# Patient Record
Sex: Female | Born: 1935 | Race: White | Hispanic: No | Marital: Single | Smoking: Former smoker
Health system: Southern US, Community
[De-identification: ages and names within clinical notes are randomized; demographics above are authoritative.]

## PROBLEM LIST (undated history)

## (undated) DIAGNOSIS — E785 Hyperlipidemia, unspecified: Secondary | ICD-10-CM

## (undated) DIAGNOSIS — F0280 Dementia in other diseases classified elsewhere without behavioral disturbance: Secondary | ICD-10-CM

## (undated) DIAGNOSIS — F0390 Unspecified dementia without behavioral disturbance: Secondary | ICD-10-CM

## (undated) DIAGNOSIS — I10 Essential (primary) hypertension: Secondary | ICD-10-CM

---

## 2019-03-17 ENCOUNTER — Emergency Department (HOSPITAL_COMMUNITY): Payer: Medicare Other

## 2019-03-17 ENCOUNTER — Encounter (HOSPITAL_COMMUNITY): Payer: Self-pay | Admitting: *Deleted

## 2019-03-17 ENCOUNTER — Other Ambulatory Visit: Payer: Self-pay

## 2019-03-17 ENCOUNTER — Emergency Department (HOSPITAL_COMMUNITY)
Admission: EM | Admit: 2019-03-17 | Discharge: 2019-03-17 | Disposition: A | Discharge status: Home / Self Care | Payer: Medicare Other | Attending: Emergency Medicine | Admitting: Emergency Medicine

## 2019-03-17 DIAGNOSIS — S01511A Laceration without foreign body of lip, initial encounter: Secondary | ICD-10-CM

## 2019-03-17 DIAGNOSIS — W19XXXA Unspecified fall, initial encounter: Secondary | ICD-10-CM

## 2019-03-17 DIAGNOSIS — S065X0A Traumatic subdural hemorrhage without loss of consciousness, initial encounter: Secondary | ICD-10-CM | POA: Insufficient documentation

## 2019-03-17 DIAGNOSIS — F0390 Unspecified dementia without behavioral disturbance: Secondary | ICD-10-CM | POA: Insufficient documentation

## 2019-03-17 DIAGNOSIS — S065XAA Traumatic subdural hemorrhage with loss of consciousness status unknown, initial encounter: Secondary | ICD-10-CM

## 2019-03-17 DIAGNOSIS — Y939 Activity, unspecified: Secondary | ICD-10-CM | POA: Insufficient documentation

## 2019-03-17 DIAGNOSIS — Y92199 Unspecified place in other specified residential institution as the place of occurrence of the external cause: Secondary | ICD-10-CM | POA: Insufficient documentation

## 2019-03-17 DIAGNOSIS — S0990XA Unspecified injury of head, initial encounter: Secondary | ICD-10-CM | POA: Diagnosis present

## 2019-03-17 DIAGNOSIS — S0083XA Contusion of other part of head, initial encounter: Secondary | ICD-10-CM

## 2019-03-17 DIAGNOSIS — Y999 Unspecified external cause status: Secondary | ICD-10-CM | POA: Diagnosis not present

## 2019-03-17 DIAGNOSIS — S065X9A Traumatic subdural hemorrhage with loss of consciousness of unspecified duration, initial encounter: Secondary | ICD-10-CM

## 2019-03-17 HISTORY — DX: Unspecified dementia, unspecified severity, without behavioral disturbance, psychotic disturbance, mood disturbance, and anxiety: F03.90

## 2019-03-17 HISTORY — DX: Dementia in other diseases classified elsewhere, unspecified severity, without behavioral disturbance, psychotic disturbance, mood disturbance, and anxiety: F02.80

## 2019-03-17 LAB — CBG MONITORING, ED: Glucose-Capillary: 102 mg/dL — ABNORMAL HIGH (ref 70–99)

## 2019-03-17 MED ORDER — LIDOCAINE HCL (PF) 1 % IJ SOLN
5.0000 mL | Freq: Once | INTRAMUSCULAR | Status: DC
  Filled 2019-03-17: qty 5

## 2019-03-17 NOTE — Discharge Instructions (Addendum)
[name] was evaluated in the emergency department after a fall with facial injuries. There are no fractures seen on CT scan of the face, head or neck. There is a trace SDH seen that was discussed with neurosurgery and it is felt surgery is not indicated. If there is any change in her mental status or neurologic exam changes, please re-evaluate in the emergency department.   Recommend soft foods until dental soreness resolves. There is a sutured laceration in the upper lip. These sutures will not need to be removed and will absorb. Avoid acidic foods until mouth sores heal completely.   Tylenol as needed for discomfort.

## 2019-03-17 NOTE — ED Notes (Signed)
Transported to CT 

## 2019-03-17 NOTE — ED Provider Notes (Signed)
[facility]GENCY DEPARTMENT Provider Note   CSN: [id number] Arrival date & time: 03/17/19  0445     History Chief Complaint  Patient presents with  . Fall    [name] is a 86 y.o. female.  Patient to ED from [facility] after fall to the floor while up to the bathroom. Patient is a resident of a memory unit and has advanced memory loss. No reported vomiting. Patient with facial injuries. No complaint of chest/abdomen/neck/back pain. Not on anticoagulants.    The history is provided by the patient and the EMS personnel. No language interpreter was used.  Fall       No past medical history on file.  There are no problems to display for this patient.     OB History   No obstetric history on file.     No family history on file.  Social History   Tobacco Use  . Smoking status: Not on file  Substance Use Topics  . Alcohol use: Not on file  . Drug use: Not on file    Home Medications Prior to Admission medications   Not on File    Allergies    Patient has no allergy information on record.  Review of Systems   Review of Systems  Unable to perform ROS: Dementia    Physical Exam Updated Vital Signs BP 131/68 (BP Location: Right Arm)   Pulse 94   Temp 97.8 F (36.6 C) (Oral)   Resp 18   SpO2 93%   Physical Exam Vitals and nursing note reviewed.  Constitutional:      General: She is not in acute distress. HENT:     Head:     Comments: There is an abrasion above left eyebrow with small hematoma. Nose is swollen with mild deformity. Dried blood in bilateral nostrils. There is significant bruising to upper and lower lips, and a 0.5 cm laceration to left upper lip crossing the vermilion border, and blood over front dentition, no active bleeding. No gross malocclusion. Swallowing without difficulty.  Cardiovascular:     Rate and Rhythm: Normal rate.  Pulmonary:     Effort: Pulmonary effort is normal.  Abdominal:     General:  There is no distension.     Tenderness: There is no abdominal tenderness.  Musculoskeletal:     Comments: FROM extremities with limited extension of left UE. No tenderness to palpation, no visualized bruisines.   Skin:    General: Skin is warm and dry.  Neurological:     Mental Status: She is alert.     ED Results / Procedures / Treatments   Labs (all labs ordered are listed, but only abnormal results are displayed) Labs Reviewed  CBG MONITORING, ED - Abnormal; Notable for the following components:      Result Value   Glucose-Capillary 102 (*)    All other components within normal limits    EKG None  Radiology No results found.  Procedures .Marland KitchenLaceration Repair  Date/Time: 03/18/2019 6:56 AM Performed by: Charlann Lange, PA-C Authorized by: Charlann Lange, PA-C   Consent:    Consent obtained:  Verbal   Consent given by:  Patient Laceration details:    Location:  Lip   Lip location:  Upper exterior lip   Length (cm):  0.5 Repair type:    Repair type:  Simple Exploration:    Contaminated: no   Treatment:    Area cleansed with:  Saline Skin repair:    Repair  method:  Sutures   Suture size:  5-0   Suture material:  Fast-absorbing gut   Suture technique:  Simple interrupted   Number of sutures:  3 Approximation:    Approximation:  Close Post-procedure details:    Dressing:  Open (no dressing)   Patient tolerance of procedure:  Tolerated well, no immediate complications Comments:     Vermilion border aligned well.    (including critical care time)  Medications Ordered in ED Medications - No data to display  ED Course  I have reviewed the triage vital signs and the nursing notes.  Pertinent labs & imaging results that were available during my care of the patient were reviewed by me and considered in my medical decision making (see chart for details).    MDM Rules/Calculators/A&P                      Patient to ED after fall at [facility], sustaining  facial injuries. Unclear if there was LOC.   The patient is in NAD. VSS. CT's ordered to evaluate injuries.   CT's negative for facial fractures, or c-spine injury. There is a trace subdural hematoma. This was discussed with neurosurgery who feels no further intervention indicated from surgical standpoint.  She is not on blood thinners. She has no mental status or neurologic changes while under observation in the ED. She has been seen by Dr. Eudelia Bunch. She is felt appropriate for discharge back to [facility]. Recommend observation for changes in her mentation. This was clearly stated on her discharge paperwork.   Laceration to lip repaired as per above note.   Family member, Inocencio Homes, contacted and updated. All questions answered. She is able to pick up the patient and transport back to [facility].   Final Clinical Impression(s) / ED Diagnoses Final diagnoses:  None   1. Fall 2. Facial contusions 3. Lip laceration 4. Small SDH  Rx / DC Orders ED Discharge Orders    None       Danne Harbor 03/18/19 [id number]    Nira Conn, MD 03/25/19 [phone]

## 2019-03-17 NOTE — ED Triage Notes (Signed)
Patient presents to ed via [facility] from [facility] , states she was getting up to go to the bathroom and fell face forward. Abrasion above left eye, small hematoma below left eye. Abrasion to nose lip left side upper lip.

## 2019-03-21 ENCOUNTER — Emergency Department (HOSPITAL_COMMUNITY): Payer: Medicare Other

## 2019-03-21 ENCOUNTER — Encounter (HOSPITAL_COMMUNITY): Payer: Self-pay | Admitting: Internal Medicine

## 2019-03-21 ENCOUNTER — Observation Stay (HOSPITAL_COMMUNITY)
Admission: EM | Admit: 2019-03-21 | Discharge: 2019-03-22 | Disposition: A | Discharge status: Home / Self Care | Payer: Medicare Other | Attending: Internal Medicine | Admitting: Internal Medicine

## 2019-03-21 ENCOUNTER — Other Ambulatory Visit: Payer: Self-pay

## 2019-03-21 DIAGNOSIS — K219 Gastro-esophageal reflux disease without esophagitis: Secondary | ICD-10-CM | POA: Insufficient documentation

## 2019-03-21 DIAGNOSIS — S065X9A Traumatic subdural hemorrhage with loss of consciousness of unspecified duration, initial encounter: Secondary | ICD-10-CM | POA: Diagnosis not present

## 2019-03-21 DIAGNOSIS — Z79899 Other long term (current) drug therapy: Secondary | ICD-10-CM | POA: Insufficient documentation

## 2019-03-21 DIAGNOSIS — F0280 Dementia in other diseases classified elsewhere without behavioral disturbance: Secondary | ICD-10-CM | POA: Diagnosis not present

## 2019-03-21 DIAGNOSIS — I10 Essential (primary) hypertension: Secondary | ICD-10-CM | POA: Insufficient documentation

## 2019-03-21 DIAGNOSIS — Z20822 Contact with and (suspected) exposure to covid-19: Secondary | ICD-10-CM | POA: Diagnosis not present

## 2019-03-21 DIAGNOSIS — S065XAA Traumatic subdural hemorrhage with loss of consciousness status unknown, initial encounter: Secondary | ICD-10-CM | POA: Diagnosis present

## 2019-03-21 DIAGNOSIS — G309 Alzheimer's disease, unspecified: Secondary | ICD-10-CM | POA: Insufficient documentation

## 2019-03-21 DIAGNOSIS — W06XXXA Fall from bed, initial encounter: Secondary | ICD-10-CM | POA: Diagnosis not present

## 2019-03-21 DIAGNOSIS — Z66 Do not resuscitate: Secondary | ICD-10-CM | POA: Insufficient documentation

## 2019-03-21 DIAGNOSIS — Z880 Allergy status to penicillin: Secondary | ICD-10-CM | POA: Diagnosis not present

## 2019-03-21 DIAGNOSIS — E785 Hyperlipidemia, unspecified: Secondary | ICD-10-CM | POA: Diagnosis not present

## 2019-03-21 DIAGNOSIS — R296 Repeated falls: Secondary | ICD-10-CM | POA: Insufficient documentation

## 2019-03-21 DIAGNOSIS — Z87891 Personal history of nicotine dependence: Secondary | ICD-10-CM | POA: Diagnosis not present

## 2019-03-21 DIAGNOSIS — E876 Hypokalemia: Secondary | ICD-10-CM | POA: Diagnosis not present

## 2019-03-21 DIAGNOSIS — F0390 Unspecified dementia without behavioral disturbance: Secondary | ICD-10-CM | POA: Diagnosis present

## 2019-03-21 HISTORY — DX: Essential (primary) hypertension: I10

## 2019-03-21 HISTORY — DX: Hyperlipidemia, unspecified: E78.5

## 2019-03-21 LAB — BASIC METABOLIC PANEL
Anion gap: 11 (ref 5–15)
BUN: 17 mg/dL (ref 8–23)
CO2: 25 mmol/L (ref 22–32)
Calcium: 8.6 mg/dL — ABNORMAL LOW (ref 8.9–10.3)
Chloride: 102 mmol/L (ref 98–111)
Creatinine, Ser: 0.91 mg/dL (ref 0.44–1.00)
GFR calc Af Amer: >60 mL/min (ref >60)
GFR calc non Af Amer: 54 mL/min — ABNORMAL LOW (ref >60)
Glucose, Bld: 113 mg/dL — ABNORMAL HIGH (ref 70–99)
Potassium: 3.1 mmol/L — ABNORMAL LOW (ref 3.5–5.1)
Sodium: 138 mmol/L (ref 135–145)

## 2019-03-21 LAB — CBC WITH DIFFERENTIAL/PLATELET
Abs Immature Granulocytes: 0.04 10*3/uL (ref 0.00–0.07)
Basophils Absolute: 0 10*3/uL (ref 0.0–0.1)
Basophils Relative: 0 %
Eosinophils Absolute: 0 10*3/uL (ref 0.0–0.5)
Eosinophils Relative: 0 %
HCT: 39.5 % (ref 36.0–46.0)
Hemoglobin: 12.9 g/dL (ref 12.0–15.0)
Immature Granulocytes: 0 %
Lymphocytes Relative: 18 %
Lymphs Abs: 1.8 10*3/uL (ref 0.7–4.0)
MCH: 29 pg (ref 26.0–34.0)
MCHC: 32.7 g/dL (ref 30.0–36.0)
MCV: 88.8 fL (ref 80.0–100.0)
Monocytes Absolute: 1 10*3/uL (ref 0.1–1.0)
Monocytes Relative: 10 %
Neutro Abs: 7.1 10*3/uL (ref 1.7–7.7)
Neutrophils Relative %: 72 %
Platelets: 300 10*3/uL (ref 150–400)
RBC: 4.45 MIL/uL (ref 3.87–5.11)
RDW: 13.7 % (ref 11.5–15.5)
WBC: 10 10*3/uL (ref 4.0–10.5)
nRBC: 0 % (ref 0.0–0.2)

## 2019-03-21 LAB — MRSA PCR SCREENING: MRSA by PCR: NEGATIVE

## 2019-03-21 LAB — SARS CORONAVIRUS 2 (TAT 6-24 HRS): SARS Coronavirus 2: NEGATIVE

## 2019-03-21 LAB — TSH: TSH: 3.657 u[IU]/mL (ref 0.350–4.500)

## 2019-03-21 MED ORDER — PRAVASTATIN SODIUM 20 MG PO TABS
10.0000 mg | ORAL_TABLET | Freq: Every day | ORAL | Status: DC
  Administered 2019-03-21: 10 mg via ORAL
  Filled 2019-03-21: qty 1

## 2019-03-21 MED ORDER — HYDROCHLOROTHIAZIDE 25 MG PO TABS
25.0000 mg | ORAL_TABLET | Freq: Every day | ORAL | Status: DC
  Administered 2019-03-21 – 2019-03-22 (×2): 25 mg via ORAL
  Filled 2019-03-21 (×2): qty 1

## 2019-03-21 MED ORDER — TRAZODONE HCL 50 MG PO TABS
50.0000 mg | ORAL_TABLET | Freq: Every day | ORAL | Status: DC
  Administered 2019-03-21: 50 mg via ORAL
  Filled 2019-03-21: qty 1

## 2019-03-21 MED ORDER — LOSARTAN POTASSIUM 50 MG PO TABS
100.0000 mg | ORAL_TABLET | Freq: Every day | ORAL | Status: DC
  Administered 2019-03-21 – 2019-03-22 (×2): 100 mg via ORAL
  Filled 2019-03-21 (×2): qty 2

## 2019-03-21 MED ORDER — POTASSIUM CHLORIDE CRYS ER 20 MEQ PO TBCR: 40.0000 meq | EXTENDED_RELEASE_TABLET | Freq: Once | ORAL | Status: DC

## 2019-03-21 MED ORDER — LOSARTAN POTASSIUM-HCTZ 100-25 MG PO TABS: 1.0000 | ORAL_TABLET | Freq: Every day | ORAL | Status: DC

## 2019-03-21 MED ORDER — ACETAMINOPHEN 325 MG PO TABS
650.0000 mg | ORAL_TABLET | Freq: Four times a day (QID) | ORAL | Status: DC | PRN
  Administered 2019-03-21: 650 mg via ORAL
  Filled 2019-03-21: qty 2

## 2019-03-21 MED ORDER — SODIUM CHLORIDE 0.9 % IV SOLN: INTRAVENOUS | Status: DC

## 2019-03-21 MED ORDER — ONDANSETRON HCL 4 MG PO TABS: 4.0000 mg | ORAL_TABLET | Freq: Four times a day (QID) | ORAL | Status: DC | PRN

## 2019-03-21 MED ORDER — ONDANSETRON HCL 4 MG/2ML IJ SOLN: 4.0000 mg | Freq: Four times a day (QID) | INTRAMUSCULAR | Status: DC | PRN

## 2019-03-21 MED ORDER — ACETAMINOPHEN 650 MG RE SUPP: 650.0000 mg | Freq: Four times a day (QID) | RECTAL | Status: DC | PRN

## 2019-03-21 MED ORDER — MELATONIN 5 MG PO TABS: 5.0000 mg | ORAL_TABLET | Freq: Every evening | ORAL | Status: DC | PRN

## 2019-03-21 MED ORDER — GUAIFENESIN-CODEINE 100-10 MG/5ML PO SOLN
5.0000 mL | Freq: Three times a day (TID) | ORAL | Status: DC | PRN
  Administered 2019-03-22: 5 mL via ORAL
  Filled 2019-03-21: qty 5

## 2019-03-21 MED ORDER — POTASSIUM CHLORIDE CRYS ER 20 MEQ PO TBCR
40.0000 meq | EXTENDED_RELEASE_TABLET | Freq: Once | ORAL | Status: AC
  Administered 2019-03-21: 40 meq via ORAL
  Filled 2019-03-21: qty 2

## 2019-03-21 MED ORDER — PANTOPRAZOLE SODIUM 40 MG PO TBEC
40.0000 mg | DELAYED_RELEASE_TABLET | Freq: Every day | ORAL | Status: DC
  Administered 2019-03-21 – 2019-03-22 (×2): 40 mg via ORAL
  Filled 2019-03-21 (×2): qty 1

## 2019-03-21 MED ORDER — SENNOSIDES-DOCUSATE SODIUM 8.6-50 MG PO TABS: 1.0000 | ORAL_TABLET | Freq: Every evening | ORAL | Status: DC | PRN

## 2019-03-21 MED ORDER — ORAL CARE MOUTH RINSE
15.0000 mL | Freq: Two times a day (BID) | OROMUCOSAL | Status: DC
  Administered 2019-03-21 – 2019-03-22 (×3): 15 mL via OROMUCOSAL

## 2019-03-21 NOTE — ED Notes (Signed)
Patient denies pain at this time.

## 2019-03-21 NOTE — ED Notes (Signed)
ED TO INPATIENT HANDOFF REPORT  Name/Age/Gender [name] 86 y.o. female  Code Status Advance Directive Documentation     Most Recent Value  Type of Advance Directive  Healthcare Power of Attorney, Living will  Pre-existing out of facility DNR order (yellow form or pink MOST form)  --  "MOST" Form in Place?  --      Home/SNF/Other Skilled nursing facility  Chief Complaint Subdural hematoma ([place]) [N56.2Z3Y]  Level of Care/Admitting Diagnosis ED Disposition    ED Disposition Condition [place]: [facility] [[id number]]  Level of Care: Telemetry [5]  Admit to tele based on following criteria: Monitor for Ischemic changes  Covid Evaluation: Asymptomatic Screening Protocol (No Symptoms)  Diagnosis: Subdural hematoma [facility]) [[id number]]  Admitting Physician: [place] ERIC J [[id number]]  Attending Physician: [place] ERIC J [[id number]]       Medical History Past Medical History:  Diagnosis Date  . Alzheimer disease ([place])   . Dementia ([place])   . Essential hypertension   . Hyperlipidemia     Allergies Allergies  Allergen Reactions  . Penicillins Other (See Comments)    unknown    IV Location/Drains/Wounds Patient Lines/Drains/Airways Status   Active Line/Drains/Airways    Name:   Placement date:   Placement time:   Site:   Days:   Peripheral IV 03/21/19 Right;Posterior Wrist   03/21/19    0941    Wrist   less than 1          Labs/Imaging Results for orders placed or performed during the hospital encounter of 03/21/19 (from the past 48 hour(s))  CBC with Differential/Platelet     Status: None   Collection Time: 03/21/19  9:27 AM  Result Value Ref Range   WBC 10.0 4.0 - 10.5 K/uL   RBC 4.45 3.87 - 5.11 MIL/uL   Hemoglobin 12.9 12.0 - 15.0 g/dL   HCT 39.5 36.0 - 46.0 %   MCV 88.8 80.0 - 100.0 fL   MCH 29.0 26.0 - 34.0 pg   MCHC 32.7 30.0 - 36.0 g/dL   RDW 13.7 11.5 - 15.5 %   Platelets 300 150 - 400 K/uL   nRBC 0.0 0.0 - 0.2  %   Neutrophils Relative % 72 %   Neutro Abs 7.1 1.7 - 7.7 K/uL   Lymphocytes Relative 18 %   Lymphs Abs 1.8 0.7 - 4.0 K/uL   Monocytes Relative 10 %   Monocytes Absolute 1.0 0.1 - 1.0 K/uL   Eosinophils Relative 0 %   Eosinophils Absolute 0.0 0.0 - 0.5 K/uL   Basophils Relative 0 %   Basophils Absolute 0.0 0.0 - 0.1 K/uL   Immature Granulocytes 0 %   Abs Immature Granulocytes 0.04 0.00 - 0.07 K/uL    Comment: Performed at [facility], [place] [address] [place], [place] [address]  Basic metabolic panel     Status: Abnormal   Collection Time: 03/21/19  9:27 AM  Result Value Ref Range   Sodium 138 135 - 145 mmol/L   Potassium 3.1 (L) 3.5 - 5.1 mmol/L   Chloride 102 98 - 111 mmol/L   CO2 25 22 - 32 mmol/L   Glucose, Bld 113 (H) 70 - 99 mg/dL   BUN 17 8 - 23 mg/dL   Creatinine, Ser 0.91 0.44 - 1.00 mg/dL   Calcium 8.6 (L) 8.9 - 10.3 mg/dL   GFR calc non Af Amer 54 (L) >60 mL/min   GFR calc Af Amer >  60 >60 mL/min   Anion gap 11 5 - 15    Comment: Performed at [facility], [address], [place], [place] [address]   CT Head Wo Contrast  Result Date: 03/21/2019 CLINICAL DATA:  Unwitnessed fall.  Facial trauma. EXAM: CT HEAD WITHOUT CONTRAST CT MAXILLOFACIAL WITHOUT CONTRAST CT CERVICAL SPINE WITHOUT CONTRAST TECHNIQUE: Multidetector CT imaging of the head, cervical spine, and maxillofacial structures were performed using the standard protocol without intravenous contrast. Multiplanar CT image reconstructions of the cervical spine and maxillofacial structures were also generated. COMPARISON:  CT 03/12/2019 FINDINGS: CT HEAD FINDINGS Brain: Acute subdural hematoma on the left has developed since the prior study. This measures approximately 3 mm anteriorly. Focal high density hematoma left posterior temporal lobe measures 7 mm in thickness. Resolving small interhemispheric subdural hematoma anteriorly compared with the prior study. Generalized atrophy with  extensive chronic microvascular ischemic change in the white matter. No acute ischemic infarct. 8 mm partially calcified extra-axial mass above the right orbit unchanged compatible with meningioma. Vascular: Negative for hyperdense vessel. Skull: Negative for skull fracture. Other: None CT MAXILLOFACIAL FINDINGS Osseous: Negative for facial fracture. Orbits: Bilateral cataract extraction.  No orbital mass or hematoma Sinuses: Mild mucosal edema right maxillary sinus. No air-fluid level. Soft tissues: Mild soft tissue swelling right supraorbital region. Improving soft tissue swelling left supraorbital region. CT CERVICAL SPINE FINDINGS Alignment: Normal Skull base and vertebrae: Negative for fracture Soft tissues and spinal canal: Negative Disc levels: Disc degeneration and mild spurring throughout the cervical spine. Mild diffuse facet degeneration. Upper chest: Lung apices clear bilaterally. Atherosclerotic aortic arch. Other: None IMPRESSION: 1. Acute left-sided subdural hematoma measuring up to 7 mm in the left posterior temporal region. Smaller subdural hematoma extends into the left frontal lobe. No midline shift. CT head 3 days ago demonstrated small interhemispheric subdural hematoma which shows mild improvement. 2. Atrophy and extensive chronic microvascular ischemic change. No acute infarct. 3. Negative for facial fracture 4. Negative for cervical spine fracture. 5. These results were called by telephone at the time of interpretation on 03/21/2019 at 8:58 am to provider Lorre Nick , who verbally acknowledged these results. Electronically Signed   By: Marlan Palau M.D.   On: 03/21/2019 08:58   CT Cervical Spine Wo Contrast  Result Date: 03/21/2019 CLINICAL DATA:  Unwitnessed fall.  Facial trauma. EXAM: CT HEAD WITHOUT CONTRAST CT MAXILLOFACIAL WITHOUT CONTRAST CT CERVICAL SPINE WITHOUT CONTRAST TECHNIQUE: Multidetector CT imaging of the head, cervical spine, and maxillofacial structures were  performed using the standard protocol without intravenous contrast. Multiplanar CT image reconstructions of the cervical spine and maxillofacial structures were also generated. COMPARISON:  CT 03/12/2019 FINDINGS: CT HEAD FINDINGS Brain: Acute subdural hematoma on the left has developed since the prior study. This measures approximately 3 mm anteriorly. Focal high density hematoma left posterior temporal lobe measures 7 mm in thickness. Resolving small interhemispheric subdural hematoma anteriorly compared with the prior study. Generalized atrophy with extensive chronic microvascular ischemic change in the white matter. No acute ischemic infarct. 8 mm partially calcified extra-axial mass above the right orbit unchanged compatible with meningioma. Vascular: Negative for hyperdense vessel. Skull: Negative for skull fracture. Other: None CT MAXILLOFACIAL FINDINGS Osseous: Negative for facial fracture. Orbits: Bilateral cataract extraction.  No orbital mass or hematoma Sinuses: Mild mucosal edema right maxillary sinus. No air-fluid level. Soft tissues: Mild soft tissue swelling right supraorbital region. Improving soft tissue swelling left supraorbital region. CT CERVICAL SPINE FINDINGS Alignment: Normal Skull base and vertebrae:  Negative for fracture Soft tissues and spinal canal: Negative Disc levels: Disc degeneration and mild spurring throughout the cervical spine. Mild diffuse facet degeneration. Upper chest: Lung apices clear bilaterally. Atherosclerotic aortic arch. Other: None IMPRESSION: 1. Acute left-sided subdural hematoma measuring up to 7 mm in the left posterior temporal region. Smaller subdural hematoma extends into the left frontal lobe. No midline shift. CT head 3 days ago demonstrated small interhemispheric subdural hematoma which shows mild improvement. 2. Atrophy and extensive chronic microvascular ischemic change. No acute infarct. 3. Negative for facial fracture 4. Negative for cervical spine  fracture. 5. These results were called by telephone at the time of interpretation on 03/21/2019 at 8:58 am to provider Lorre Nick , who verbally acknowledged these results. Electronically Signed   By: Marlan Palau M.D.   On: 03/21/2019 08:58   CT Maxillofacial WO CM  Result Date: 03/21/2019 CLINICAL DATA:  Unwitnessed fall.  Facial trauma. EXAM: CT HEAD WITHOUT CONTRAST CT MAXILLOFACIAL WITHOUT CONTRAST CT CERVICAL SPINE WITHOUT CONTRAST TECHNIQUE: Multidetector CT imaging of the head, cervical spine, and maxillofacial structures were performed using the standard protocol without intravenous contrast. Multiplanar CT image reconstructions of the cervical spine and maxillofacial structures were also generated. COMPARISON:  CT 03/12/2019 FINDINGS: CT HEAD FINDINGS Brain: Acute subdural hematoma on the left has developed since the prior study. This measures approximately 3 mm anteriorly. Focal high density hematoma left posterior temporal lobe measures 7 mm in thickness. Resolving small interhemispheric subdural hematoma anteriorly compared with the prior study. Generalized atrophy with extensive chronic microvascular ischemic change in the white matter. No acute ischemic infarct. 8 mm partially calcified extra-axial mass above the right orbit unchanged compatible with meningioma. Vascular: Negative for hyperdense vessel. Skull: Negative for skull fracture. Other: None CT MAXILLOFACIAL FINDINGS Osseous: Negative for facial fracture. Orbits: Bilateral cataract extraction.  No orbital mass or hematoma Sinuses: Mild mucosal edema right maxillary sinus. No air-fluid level. Soft tissues: Mild soft tissue swelling right supraorbital region. Improving soft tissue swelling left supraorbital region. CT CERVICAL SPINE FINDINGS Alignment: Normal Skull base and vertebrae: Negative for fracture Soft tissues and spinal canal: Negative Disc levels: Disc degeneration and mild spurring throughout the cervical spine. Mild diffuse  facet degeneration. Upper chest: Lung apices clear bilaterally. Atherosclerotic aortic arch. Other: None IMPRESSION: 1. Acute left-sided subdural hematoma measuring up to 7 mm in the left posterior temporal region. Smaller subdural hematoma extends into the left frontal lobe. No midline shift. CT head 3 days ago demonstrated small interhemispheric subdural hematoma which shows mild improvement. 2. Atrophy and extensive chronic microvascular ischemic change. No acute infarct. 3. Negative for facial fracture 4. Negative for cervical spine fracture. 5. These results were called by telephone at the time of interpretation on 03/21/2019 at 8:58 am to provider Lorre Nick , who verbally acknowledged these results. Electronically Signed   By: Marlan Palau M.D.   On: 03/21/2019 08:58    Pending Labs Unresulted Labs (From admission, onward)    Start     Ordered   03/21/19 0901  SARS CORONAVIRUS 2 (TAT 6-24 HRS) Nasopharyngeal Nasopharyngeal Swab  (Tier 3 (TAT 6-24 hrs))  Once,   STAT    Question Answer Comment  Is this test for diagnosis or screening Screening   Symptomatic for COVID-19 as defined by CDC No   Hospitalized for COVID-19 No   Admitted to ICU for COVID-19 No   Previously tested for COVID-19 No   Resident in a congregate (group) care setting No  Employed in healthcare setting No   Pregnant No      03/21/19 0900   Signed and Held  TSH  Once,   R     Signed and Held   Signed and Armed forces training and education officer morning,   R     Signed and Held   Signed and Held  CBC  Tomorrow morning,   R     Signed and Held   Signed and Held  Magnesium  Daily,   R     Signed and Held          Vitals/Pain Today's Vitals   03/21/19 0900 03/21/19 1000 03/21/19 1100 03/21/19 1130  BP: 139/66 132/63 130/80 123/63  Pulse: 78 100 89 70  Resp:    17  Temp:      TempSrc:      SpO2: 93% 94% 95% 92%  Weight:      Height:      PainSc:        Isolation Precautions No active  isolations  Medications Medications  0.9 %  sodium chloride infusion ( Intravenous New Bag/Given 03/21/19 0943)  potassium chloride SA (KLOR-CON) CR tablet 40 mEq (40 mEq Oral Given 03/21/19 1032)    Mobility walks with device

## 2019-03-21 NOTE — H&P (Signed)
History and Physical    [name] GEX:[id number] DOB: August 10, 1922 DOA: 03/21/2019  PCP: Soledad Gerlach, MD  Patient coming from: [facility]  I have personally briefly reviewed patient's old medical records in [facility]  Chief Complaint: Fall  HPI: [name] is a 86 y.o. female with medical history significant of dementia, essential hypertension, hyperlipidemia, GERD who presents from [facility], SNF/memory care for recurrent fall.  Patient is pleasantly confused, and mentation appears to be at baseline per niece's report.  History obtained from patient's niece who is the healthcare power of attorney who is present at bedside given her dementia.  Apparently, patient had a fall this morning when she was getting out of bed striking the windowsill.  This is a recurrent event, in which on Monday she also fell onto the tile floor in her bathroom.  This is a new issue per the niece, she currently does not have a bed alarm at the facility.  ED Course: Temperature 97.7, HR 93, RR 16, BP 140/70, SPO2 92% on room air.  The BC count 10.0, hemoglobin 12.9, platelet 300.  Sodium 138, potassium 3.1, chloride 102, CO2 25, BUN 17, creatinine 0.91, glucose 113.  CT head with acute left subdural hematoma 7 mm in the left posterior temporal region, smaller subdural hematoma extending into the left frontal lobe.  No midline shift.  Prior small interhemispheric subdural from CT 3 days ago shows mild improvement.  No facial fracture, no cervical spine fracture.  No acute infarct.  ED physician, Dr. Freida Busman discussed case with neurosurgery Dr. Wynetta Emery, who recommended medicine admission for observation with repeat CT head in a.m.  Review of Systems: As per HPI otherwise 10 point review of systems negative.    Past Medical History:  Diagnosis Date  . Alzheimer disease (HCC)   . Dementia (HCC)   . Essential hypertension   . Hyperlipidemia      has no history on file for tobacco, alcohol, and drug.  Allergies    Allergen Reactions  . Penicillins Other (See Comments)    unknown    No family history on file.  Family history reviewed and not pertinent   Prior to Admission medications   Medication Sig Start Date End Date Taking? Authorizing Provider  acetaminophen (TYLENOL) 325 MG tablet Take 650 mg by mouth every 6 (six) hours as needed for mild pain or headache.   Yes [provider]  guaiFENesin-codeine (ROBITUSSIN AC) 100-10 MG/5ML syrup Take 5 mLs by mouth every 8 (eight) hours as needed for cough.   Yes [provider]  losartan-hydrochlorothiazide (HYZAAR) 100-25 MG tablet Take 1 tablet by mouth daily. 03/05/19  Yes [provider]  Melatonin 5 MG TABS Take 5 mg by mouth at bedtime as needed (sleep).   Yes [provider]  omeprazole (PRILOSEC) 20 MG capsule Take 20 mg by mouth every other day. 03/05/19  Yes [provider]  pravastatin (PRAVACHOL) 10 MG tablet Take 10 mg by mouth daily.  03/05/19  Yes [provider]  traZODone (DESYREL) 50 MG tablet Take 50 mg by mouth at bedtime. 03/05/19  Yes [provider]    Physical Exam: Vitals:   03/21/19 0711 03/21/19 0830 03/21/19 0851  BP: 130/68 140/70   Pulse: 73 93   Resp: 16    Temp: 97.7 F (36.5 C)    TempSrc: Oral    SpO2: 92% 92%   Weight:   68 kg  Height:   5\' 4"  (1.626 m)  Constitutional: NAD, calm, comfortable, pleasantly confused Eyes: PERRL, ecchymosis noted to left orbit ENMT: Mucous membranes are moist. Posterior pharynx clear of any exudate or lesions.Normal dentition.  Ecchymosis noted to left lower lip Neck: normal, supple, no masses, no thyromegaly Respiratory: clear to auscultation bilaterally, no wheezing, no crackles. Normal respiratory effort. No accessory muscle use.  Cardiovascular: Regular rate and rhythm, no murmurs / rubs / gallops. No extremity edema. 2+ pedal pulses. No carotid bruits.  Abdomen: no tenderness, no masses palpated. No  hepatosplenomegaly. Bowel sounds positive.  Musculoskeletal: no clubbing / cyanosis. No joint deformity upper and lower extremities. Good ROM, no contractures. Normal muscle tone.  Skin: no rashes, lesions, ulcers. No induration Neurologic: CN 2-12 grossly intact. Sensation intact, DTR normal. Strength 5/5 in all 4.  Psychiatric: Alert, normal mood, pleasantly confused   Labs on Admission: I have personally reviewed following labs and imaging studies  CBC: Recent Labs  Lab 03/21/19 0927  WBC 10.0  NEUTROABS 7.1  HGB 12.9  HCT 39.5  MCV 88.8  PLT 300   Basic Metabolic Panel: Recent Labs  Lab 03/21/19 0927  NA 138  K 3.1*  CL 102  CO2 25  GLUCOSE 113*  BUN 17  CREATININE 0.91  CALCIUM 8.6*   GFR: Estimated Creatinine Clearance: 35.8 mL/min (by C-G formula based on SCr of 0.91 mg/dL). Liver Function Tests: No results for input(s): AST, ALT, ALKPHOS, BILITOT, PROT, ALBUMIN in the last 168 hours. No results for input(s): LIPASE, AMYLASE in the last 168 hours. No results for input(s): AMMONIA in the last 168 hours. Coagulation Profile: No results for input(s): INR, PROTIME in the last 168 hours. Cardiac Enzymes: No results for input(s): CKTOTAL, CKMB, CKMBINDEX, TROPONINI in the last 168 hours. BNP (last 3 results) No results for input(s): PROBNP in the last 8760 hours. HbA1C: No results for input(s): HGBA1C in the last 72 hours. CBG: Recent Labs  Lab 03/17/19 0451  GLUCAP 102*   Lipid Profile: No results for input(s): CHOL, HDL, LDLCALC, TRIG, CHOLHDL, LDLDIRECT in the last 72 hours. Thyroid Function Tests: No results for input(s): TSH, T4TOTAL, FREET4, T3FREE, THYROIDAB in the last 72 hours. Anemia Panel: No results for input(s): VITAMINB12, FOLATE, FERRITIN, TIBC, IRON, RETICCTPCT in the last 72 hours. Urine analysis: No results found for: COLORURINE, APPEARANCEUR, LABSPEC, PHURINE, GLUCOSEU, HGBUR, BILIRUBINUR, KETONESUR, PROTEINUR, UROBILINOGEN, NITRITE,  LEUKOCYTESUR  Radiological Exams on Admission: CT Head Wo Contrast  Result Date: 03/21/2019 CLINICAL DATA:  Unwitnessed fall.  Facial trauma. EXAM: CT HEAD WITHOUT CONTRAST CT MAXILLOFACIAL WITHOUT CONTRAST CT CERVICAL SPINE WITHOUT CONTRAST TECHNIQUE: Multidetector CT imaging of the head, cervical spine, and maxillofacial structures were performed using the standard protocol without intravenous contrast. Multiplanar CT image reconstructions of the cervical spine and maxillofacial structures were also generated. COMPARISON:  CT 03/12/2019 FINDINGS: CT HEAD FINDINGS Brain: Acute subdural hematoma on the left has developed since the prior study. This measures approximately 3 mm anteriorly. Focal high density hematoma left posterior temporal lobe measures 7 mm in thickness. Resolving small interhemispheric subdural hematoma anteriorly compared with the prior study. Generalized atrophy with extensive chronic microvascular ischemic change in the white matter. No acute ischemic infarct. 8 mm partially calcified extra-axial mass above the right orbit unchanged compatible with meningioma. Vascular: Negative for hyperdense vessel. Skull: Negative for skull fracture. Other: None CT MAXILLOFACIAL FINDINGS Osseous: Negative for facial fracture. Orbits: Bilateral cataract extraction.  No orbital mass or hematoma Sinuses: Mild mucosal edema right maxillary sinus. No air-fluid level. Soft tissues: Mild  soft tissue swelling right supraorbital region. Improving soft tissue swelling left supraorbital region. CT CERVICAL SPINE FINDINGS Alignment: Normal Skull base and vertebrae: Negative for fracture Soft tissues and spinal canal: Negative Disc levels: Disc degeneration and mild spurring throughout the cervical spine. Mild diffuse facet degeneration. Upper chest: Lung apices clear bilaterally. Atherosclerotic aortic arch. Other: None IMPRESSION: 1. Acute left-sided subdural hematoma measuring up to 7 mm in the left posterior  temporal region. Smaller subdural hematoma extends into the left frontal lobe. No midline shift. CT head 3 days ago demonstrated small interhemispheric subdural hematoma which shows mild improvement. 2. Atrophy and extensive chronic microvascular ischemic change. No acute infarct. 3. Negative for facial fracture 4. Negative for cervical spine fracture. 5. These results were called by telephone at the time of interpretation on 03/21/2019 at 8:58 am to provider Lorre Nick , who verbally acknowledged these results. Electronically Signed   By: Marlan Palau M.D.   On: 03/21/2019 08:58   CT Cervical Spine Wo Contrast  Result Date: 03/21/2019 CLINICAL DATA:  Unwitnessed fall.  Facial trauma. EXAM: CT HEAD WITHOUT CONTRAST CT MAXILLOFACIAL WITHOUT CONTRAST CT CERVICAL SPINE WITHOUT CONTRAST TECHNIQUE: Multidetector CT imaging of the head, cervical spine, and maxillofacial structures were performed using the standard protocol without intravenous contrast. Multiplanar CT image reconstructions of the cervical spine and maxillofacial structures were also generated. COMPARISON:  CT 03/12/2019 FINDINGS: CT HEAD FINDINGS Brain: Acute subdural hematoma on the left has developed since the prior study. This measures approximately 3 mm anteriorly. Focal high density hematoma left posterior temporal lobe measures 7 mm in thickness. Resolving small interhemispheric subdural hematoma anteriorly compared with the prior study. Generalized atrophy with extensive chronic microvascular ischemic change in the white matter. No acute ischemic infarct. 8 mm partially calcified extra-axial mass above the right orbit unchanged compatible with meningioma. Vascular: Negative for hyperdense vessel. Skull: Negative for skull fracture. Other: None CT MAXILLOFACIAL FINDINGS Osseous: Negative for facial fracture. Orbits: Bilateral cataract extraction.  No orbital mass or hematoma Sinuses: Mild mucosal edema right maxillary sinus. No air-fluid  level. Soft tissues: Mild soft tissue swelling right supraorbital region. Improving soft tissue swelling left supraorbital region. CT CERVICAL SPINE FINDINGS Alignment: Normal Skull base and vertebrae: Negative for fracture Soft tissues and spinal canal: Negative Disc levels: Disc degeneration and mild spurring throughout the cervical spine. Mild diffuse facet degeneration. Upper chest: Lung apices clear bilaterally. Atherosclerotic aortic arch. Other: None IMPRESSION: 1. Acute left-sided subdural hematoma measuring up to 7 mm in the left posterior temporal region. Smaller subdural hematoma extends into the left frontal lobe. No midline shift. CT head 3 days ago demonstrated small interhemispheric subdural hematoma which shows mild improvement. 2. Atrophy and extensive chronic microvascular ischemic change. No acute infarct. 3. Negative for facial fracture 4. Negative for cervical spine fracture. 5. These results were called by telephone at the time of interpretation on 03/21/2019 at 8:58 am to provider Lorre Nick , who verbally acknowledged these results. Electronically Signed   By: Marlan Palau M.D.   On: 03/21/2019 08:58   CT Maxillofacial WO CM  Result Date: 03/21/2019 CLINICAL DATA:  Unwitnessed fall.  Facial trauma. EXAM: CT HEAD WITHOUT CONTRAST CT MAXILLOFACIAL WITHOUT CONTRAST CT CERVICAL SPINE WITHOUT CONTRAST TECHNIQUE: Multidetector CT imaging of the head, cervical spine, and maxillofacial structures were performed using the standard protocol without intravenous contrast. Multiplanar CT image reconstructions of the cervical spine and maxillofacial structures were also generated. COMPARISON:  CT 03/12/2019 FINDINGS: CT HEAD FINDINGS Brain: Acute subdural  hematoma on the left has developed since the prior study. This measures approximately 3 mm anteriorly. Focal high density hematoma left posterior temporal lobe measures 7 mm in thickness. Resolving small interhemispheric subdural hematoma  anteriorly compared with the prior study. Generalized atrophy with extensive chronic microvascular ischemic change in the white matter. No acute ischemic infarct. 8 mm partially calcified extra-axial mass above the right orbit unchanged compatible with meningioma. Vascular: Negative for hyperdense vessel. Skull: Negative for skull fracture. Other: None CT MAXILLOFACIAL FINDINGS Osseous: Negative for facial fracture. Orbits: Bilateral cataract extraction.  No orbital mass or hematoma Sinuses: Mild mucosal edema right maxillary sinus. No air-fluid level. Soft tissues: Mild soft tissue swelling right supraorbital region. Improving soft tissue swelling left supraorbital region. CT CERVICAL SPINE FINDINGS Alignment: Normal Skull base and vertebrae: Negative for fracture Soft tissues and spinal canal: Negative Disc levels: Disc degeneration and mild spurring throughout the cervical spine. Mild diffuse facet degeneration. Upper chest: Lung apices clear bilaterally. Atherosclerotic aortic arch. Other: None IMPRESSION: 1. Acute left-sided subdural hematoma measuring up to 7 mm in the left posterior temporal region. Smaller subdural hematoma extends into the left frontal lobe. No midline shift. CT head 3 days ago demonstrated small interhemispheric subdural hematoma which shows mild improvement. 2. Atrophy and extensive chronic microvascular ischemic change. No acute infarct. 3. Negative for facial fracture 4. Negative for cervical spine fracture. 5. These results were called by telephone at the time of interpretation on 03/21/2019 at 8:58 am to provider Lacretia Leigh , who verbally acknowledged these results. Electronically Signed   By: Franchot Gallo M.D.   On: 03/21/2019 08:58    EKG: Independently reviewed.   Assessment/Plan Principal Problem:   Subdural hematoma (HCC) Active Problems:   Essential hypertension   Dementia without behavioral disturbance (HCC)   Hyperlipidemia   Hypokalemia    Acute subdural  hematoma Patient presenting from SNF with recurrent fall.  Initially fell last Monday with small subdural hematoma and discharged back home.  Now he presents with recurrent fall early this morning after getting out of bed.  CT head now with new acute left subdural hematoma 7 mm.  Case was discussed by ED physician, Dr. Zenia Resides with neurosurgery on-call, Dr. Saintclair Halsted who recommended medicine admission for observation overnight with repeat CT head in the a.m.  Patient currently alert, pleasantly confused which is her normal baseline per niece's report at bedside. --Admit to observation --Neurochecks every 4 hours --Anticoagulation/chemical DVT prophylaxis indicated an acute subdural hemorrhage --Repeat CT head tomorrow morning --Further per neurosurgery recommendations --Would benefit from a bed alarm when returns to SNF  Hypokalemia Potassium 3.1 on admission.  Repleted in the ED. --Repeat electrolytes to include magnesium in the a.m.  Essential hypertension BP 140/70. --Continue home losartan-HCTZ 100-25 mg p.o. daily  Hyperlipidemia: Continue statin  Dementia: Stable, without behavioral disturbance. --Continue trazodone and melatonin qHS   DVT prophylaxis: SCDs, chemical DVT prophylaxis contraindicated in acute subdural hematoma Code Status: DNR -discussed with patient's healthcare per attorney, Dannielle Karvonen Family Communication: Updated patient's healthcare pair of attorney/niece at bedside, [name] Disposition Plan: Anticipate discharge back to [facility] once cleared by neurosurgery Consults called: Neurosurgery -Dr. Saintclair Halsted by ED physician Dr. Zenia Resides Admission status: Observation    Aedyn Mckeon J [place] DO [facility] Available via Epic secure chat 7am-7pm After these hours, please refer to coverage provider listed on [email or web address] 03/21/2019, 11:06 AM

## 2019-03-21 NOTE — ED Provider Notes (Signed)
[facility] DEPT Provider Note   CSN: [id number] Arrival date & time: 03/21/19  6440     History Chief Complaint  Patient presents with  . Fall    [name] is a 86 y.o. female.  86 year old female here from nursing home after unwitnessed fall.  She does have a history of dementia.  Complains of pain to her face.  Has had history of multiple falls.  No recent reported history of illness.  EMS was called and patient transported here.        Past Medical History:  Diagnosis Date  . Alzheimer disease ([place])   . Dementia ([place])     There are no problems to display for this patient.      OB History   No obstetric history on file.     No family history on file.  Social History   Tobacco Use  . Smoking status: Not on file  Substance Use Topics  . Alcohol use: Not on file  . Drug use: Not on file    Home Medications Prior to Admission medications   Not on File    Allergies    Patient has no allergy information on record.  Review of Systems   Review of Systems  Unable to perform ROS: Dementia    Physical Exam Updated Vital Signs BP 130/68 (BP Location: Left Arm)   Pulse 73   Temp 97.7 F (36.5 C) (Oral)   Resp 16   SpO2 92%   Physical Exam Vitals and nursing note reviewed.  Constitutional:      General: She is not in acute distress.    Appearance: Normal appearance. She is well-developed. She is not toxic-appearing.  HENT:     Head: Normocephalic and atraumatic. Left periorbital erythema present.   Eyes:     General: Lids are normal.     Conjunctiva/sclera: Conjunctivae normal.     Pupils: Pupils are equal, round, and reactive to light.  Neck:     Thyroid: No thyroid mass.     Trachea: No tracheal deviation.  Cardiovascular:     Rate and Rhythm: Normal rate and regular rhythm.     Heart sounds: Normal heart sounds. No murmur. No gallop.   Pulmonary:     Effort: Pulmonary effort is normal. No respiratory  distress.     Breath sounds: Normal breath sounds. No stridor. No decreased breath sounds, wheezing, rhonchi or rales.  Abdominal:     General: Bowel sounds are normal. There is no distension.     Palpations: Abdomen is soft.     Tenderness: There is no abdominal tenderness. There is no rebound.  Musculoskeletal:        General: No tenderness. Normal range of motion.     Cervical back: Normal range of motion and neck supple. No spinous process tenderness or muscular tenderness.     Right knee: Ecchymosis present. Normal range of motion.     Left knee: Ecchymosis present. Normal range of motion.     Comments: The patient had normal movement of both lower extremities at the hips and knees  Skin:    General: Skin is warm and dry.     Findings: No abrasion or rash.  Neurological:     Mental Status: She is alert and oriented to person, place, and time.     GCS: GCS eye subscore is 4. GCS verbal subscore is 5. GCS motor subscore is 6.     Cranial Nerves: No cranial  nerve deficit.     Sensory: No sensory deficit.  Psychiatric:        Attention and Perception: Attention normal.        Speech: Speech normal.        Behavior: Behavior normal.     ED Results / Procedures / Treatments   Labs (all labs ordered are listed, but only abnormal results are displayed) Labs Reviewed - No data to display  EKG None  Radiology No results found.  Procedures Procedures (including critical care time)  Medications Ordered in ED Medications - No data to display  ED Course  I have reviewed the triage vital signs and the nursing notes.  Pertinent labs & imaging results that were available during my care of the patient were reviewed by me and considered in my medical decision making (see chart for details).    MDM Rules/Calculators/A&P                     Patient mild hypokalemia with potassium of 3.1 and treated oral potassium. Patient has evidence of a 7 mm subdural hematoma without midline  shift.  Facial and neck CT without acute findings.  Case discussed with Dr. Wynetta Emery from neurosurgery who recommends patient be observed overnight and he will follow.  Will consult hospitalist Final Clinical Impression(s) / ED Diagnoses Final diagnoses:  None    Rx / DC Orders ED Discharge Orders    None       Lorre Nick, MD 03/21/19 1024

## 2019-03-21 NOTE — NC FL2 (Signed)
[place] MEDICAID FL2 LEVEL OF CARE SCREENING TOOL     IDENTIFICATION  Patient Name: [name] Birthdate: 05/26/1922 Sex: female Admission Date (Current Location): 03/21/2019  [facility] and [place] Number:  Producer, television/film/video and Address:  [facility],  [address], [place] [address]      Provider Number: [id number]  Attending Physician Name and Address:  [place], Eric J, DO  Relative Name and Phone Number:  Thad Ranger niece [phone]    Current Level of Care: Hospital Recommended Level of Care: Memory Care Prior Approval Number:    Date Approved/Denied:   PASRR Number:    Discharge Plan: Other (Comment)(Memory care)    Current Diagnoses: Patient Active Problem List   Diagnosis Date Noted  . Subdural hematoma (HCC) 03/21/2019  . Essential hypertension 03/21/2019  . Dementia without behavioral disturbance (HCC) 03/21/2019  . Hyperlipidemia 03/21/2019  . Hypokalemia 03/21/2019    Orientation RESPIRATION BLADDER Height & Weight     Self  Normal Continent Weight: 68 kg Height:  5\' 4"  (162.6 cm)  BEHAVIORAL SYMPTOMS/MOOD NEUROLOGICAL BOWEL NUTRITION STATUS      Continent Diet(Heart healthy thin)  AMBULATORY STATUS COMMUNICATION OF NEEDS Skin   Limited Assist Verbally Skin abrasions, Bruising(gen'l brusing, lip stitches)                       Personal Care Assistance Level of Assistance  Bathing, Feeding, Dressing Bathing Assistance: Limited assistance Feeding assistance: Limited assistance Dressing Assistance: Limited assistance     Functional Limitations Info  Sight, Hearing, Speech Sight Info: Impaired(eye glasses) Hearing Info: Adequate Speech Info: Adequate    SPECIAL CARE FACTORS FREQUENCY  PT (By licensed PT), OT (By licensed OT)     PT Frequency: 5x week OT Frequency: 5x week            Contractures Contractures Info: Not present    Additional Factors Info  Code Status, Allergies Code Status Info:  DNR Allergies Info: Penicillins           Current Medications (03/21/2019):  This is the current hospital active medication list Current Facility-Administered Medications  Medication Dose Route Frequency Provider Last Rate Last Admin  . 0.9 %  sodium chloride infusion   Intravenous Continuous 03/23/2019, Eric J, DO 20 mL/hr at 03/21/19 03/23/19 New Bag at 03/21/19 0943  . acetaminophen (TYLENOL) tablet 650 mg  650 mg Oral Q6H PRN 03/23/19, Eric J, DO   650 mg at 03/21/19 1439   Or  . acetaminophen (TYLENOL) suppository 650 mg  650 mg Rectal Q6H PRN 03/23/19, Eric J, DO      . guaiFENesin-codeine 100-10 MG/5ML solution 5 mL  5 mL Oral Q8H PRN 11-06-1975, Eric J, DO      . losartan (COZAAR) tablet 100 mg  100 mg Oral Daily 11-06-1975, [place], DO   100 mg at 03/21/19 1440   And  . hydrochlorothiazide (HYDRODIURIL) tablet 25 mg  25 mg Oral Daily 03/23/19, Eric J, DO   25 mg at 03/21/19 1440  . MEDLINE mouth rinse  15 mL Mouth Rinse BID 03/23/19, Eric J, DO   15 mL at 03/21/19 1440  . Melatonin TABS 5 mg  5 mg Oral QHS PRN 03/23/19, [place], DO      . ondansetron [facility]) tablet 4 mg  4 mg Oral Q6H PRN [facility], [place], DO       Or  . ondansetron [facility]) injection 4 mg  4  mg Intravenous Q6H PRN [place] Donnamarie Poag, DO      . pantoprazole (PROTONIX) EC tablet 40 mg  40 mg Oral Daily [place] Donnamarie Poag, DO   40 mg at 03/21/19 1440  . pravastatin (PRAVACHOL) tablet 10 mg  10 mg Oral q1800 [place] Donnamarie Poag, DO   10 mg at 03/21/19 1440  . senna-docusate (Senokot-S) tablet 1 tablet  1 tablet Oral QHS PRN [place] Donnamarie Poag, DO      . traZODone (DESYREL) tablet 50 mg  50 mg Oral QHS [place] Eric J, DO         Discharge Medications: Please see discharge summary for a list of discharge medications.  Relevant Imaging Results:  Relevant Lab Results:   Additional Information ss#[id number], [place], [place]

## 2019-03-21 NOTE — TOC Initial Note (Signed)
Transition of Care [facility]) - Initial/Assessment Note    Patient Details  Name: [name] MRN: [id number] Date of Birth: 1922-09-09  Transition of Care [facility]) CM/SW Contact:    Lanier Clam, RN Phone Number: 03/21/2019, 1:51 PM  Clinical Narrative: Confirmed from [facility].DNR form in shadow chart for MD to sign. d/c plan to return once medically stable.                  Expected Discharge Plan: Memory Care Barriers to Discharge: Continued Medical Work up   Patient Goals and CMS Choice        Expected Discharge Plan and Services Expected Discharge Plan: Memory Care   Discharge Planning Services: CM Consult                                          Prior Living Arrangements/Services   Lives with:: Facility Resident Patient language and need for interpreter reviewed:: Yes Do you feel safe going back to the place where you live?: Yes      Need for Family Participation in Patient Care: No (Comment) Care giver support system in place?: Yes (comment)   Criminal Activity/Legal Involvement Pertinent to Current Situation/Hospitalization: No - Comment as needed  Activities of Daily Living Home Assistive Devices/Equipment: Grab bars around toilet, Grab bars in shower, Hand-held shower hose, Blood pressure cuff, Scales, Hospital bed([facility] has necessary equipment for their residents) ADL Screening (condition at time of admission) Patient's cognitive ability adequate to safely complete daily activities?: No Is the patient deaf or have difficulty hearing?: Yes(slight hoh) Does the patient have difficulty seeing, even when wearing glasses/contacts?: No Does the patient have difficulty concentrating, remembering, or making decisions?: Yes Patient able to express need for assistance with ADLs?: Yes Does the patient have difficulty dressing or bathing?: No Independently performs ADLs?: Yes (appropriate for developmental age) Does the patient have difficulty  walking or climbing stairs?: Yes(secondary to weakness) Weakness of Legs: Both Weakness of Arms/Hands: None  Permission Sought/Granted Permission sought to share information with : Case Manager Permission granted to share information with : Yes, Verbal Permission Granted  Share Information with NAME: Case Manager Olegario Messier     Permission granted to share info w Relationship: Thad Ranger niece [phone]     Emotional Assessment Appearance:: Appears stated age Attitude/Demeanor/Rapport: Gracious Affect (typically observed): Accepting Orientation: : Oriented to Self Alcohol / Substance Use: Not Applicable Psych Involvement: No (comment)  Admission diagnosis:  Subdural hematoma (HCC) [S06.5X9A] Patient Active Problem List   Diagnosis Date Noted  . Subdural hematoma (HCC) 03/21/2019  . Essential hypertension 03/21/2019  . Dementia without behavioral disturbance (HCC) 03/21/2019  . Hyperlipidemia 03/21/2019  . Hypokalemia 03/21/2019   PCP:  Soledad Gerlach, MD Pharmacy:  No Pharmacies Listed    Social Determinants of Health (SDOH) Interventions    Readmission Risk Interventions No flowsheet data found.

## 2019-03-21 NOTE — Consult Note (Addendum)
Reason for Consult:sdh Referring Physician: edp  [name] is an 86 y.o. female.   HPI:  Pleasant 86 year old comes in today after sustaining a fall at home.  She states that she struck her head on a desk after tripping over a vacuum cleaner at home.  She is pleasantly confused.  This is apparently her baseline.  She denies any headache nausea vomiting patient changes or of dizziness.  Has a history of hypertension, hyperlipidemia, dementia, and GERD.  She does not take any blood thinners.  Past Medical History:  Diagnosis Date  . Alzheimer disease ([place])   . Dementia ([place])   . Essential hypertension   . Hyperlipidemia     History reviewed. No pertinent surgical history.  Allergies  Allergen Reactions  . Penicillins Other (See Comments)    unknown    Social History   Tobacco Use  . Smoking status: Former Smoker    Packs/day: 0.25    Types: Cigarettes  . Smokeless tobacco: Never Used  . Tobacco comment: smoked socially  Substance Use Topics  . Alcohol use: Never    History reviewed. No pertinent family history.   Review of Systems  Positive ROS: As above  All other systems have been reviewed and were otherwise negative with the exception of those mentioned in the HPI and as above.  Objective: Vital signs in last 24 hours: Temp:  [97.7 F (36.5 C)-98 F (36.7 C)] 98 F (36.7 C) (01/22 1257) Pulse Rate:  [70-100] 78 (01/22 1257) Resp:  [16-17] 16 (01/22 1257) BP: (123-140)/(59-80) 128/59 (01/22 1257) SpO2:  [92 %-98 %] 98 % (01/22 1257) Weight:  [68 kg] 68 kg (01/22 0851)  General Appearance: Alert, cooperative, no distress, appears stated age Head: Normocephalic, without obvious abnormality, atraumatic Eyes: PERRL, conjunctiva/corneas clear, EOM's intact, fundi benign, both eyes      Lungs: respirations unlabored Heart: Regular rate and rhythm Skin: Skin color, texture, turgor normal, ecchymosis around left eye with abrasions on forehead and upper  lip.  NEUROLOGIC:   Mental status: A&O x2, no aphasia, good attention span, Memory and fund of knowledge, dementia Motor Exam - grossly normal, normal tone and bulk Sensory Exam - grossly normal Reflexes: symmetric, no pathologic reflexes, No Hoffman's, No clonus Coordination - grossly normal Gait -not tested Balance -not tested Cranial Nerves: I: smell Not tested  II: visual acuity  OS: na    OD: na  II: visual fields Full to confrontation  II: pupils Equal, round, reactive to light  III,VII: ptosis None  III,IV,VI: extraocular muscles  Full ROM  V: mastication Normal  V: facial light touch sensation  Normal  V,VII: corneal reflex  Present  VII: facial muscle function - upper  Normal  VII: facial muscle function - lower Normal  VIII: hearing Not tested  IX: soft palate elevation  Normal  IX,X: gag reflex Present  XI: trapezius strength  5/5  XI: sternocleidomastoid strength 5/5  XI: neck flexion strength  5/5  XII: tongue strength  Normal    Data Review Lab Results  Component Value Date   WBC 10.0 03/21/2019   HGB 12.9 03/21/2019   HCT 39.5 03/21/2019   MCV 88.8 03/21/2019   PLT 300 03/21/2019   Lab Results  Component Value Date   NA 138 03/21/2019   K 3.1 (L) 03/21/2019   CL 102 03/21/2019   CO2 25 03/21/2019   BUN 17 03/21/2019   CREATININE 0.91 03/21/2019   GLUCOSE 113 (H) 03/21/2019   No results  found for: INR, PROTIME  Radiology: CT Head Wo Contrast  Result Date: 03/21/2019 CLINICAL DATA:  Unwitnessed fall.  Facial trauma. EXAM: CT HEAD WITHOUT CONTRAST CT MAXILLOFACIAL WITHOUT CONTRAST CT CERVICAL SPINE WITHOUT CONTRAST TECHNIQUE: Multidetector CT imaging of the head, cervical spine, and maxillofacial structures were performed using the standard protocol without intravenous contrast. Multiplanar CT image reconstructions of the cervical spine and maxillofacial structures were also generated. COMPARISON:  CT 03/12/2019 FINDINGS: CT HEAD FINDINGS Brain:  Acute subdural hematoma on the left has developed since the prior study. This measures approximately 3 mm anteriorly. Focal high density hematoma left posterior temporal lobe measures 7 mm in thickness. Resolving small interhemispheric subdural hematoma anteriorly compared with the prior study. Generalized atrophy with extensive chronic microvascular ischemic change in the white matter. No acute ischemic infarct. 8 mm partially calcified extra-axial mass above the right orbit unchanged compatible with meningioma. Vascular: Negative for hyperdense vessel. Skull: Negative for skull fracture. Other: None CT MAXILLOFACIAL FINDINGS Osseous: Negative for facial fracture. Orbits: Bilateral cataract extraction.  No orbital mass or hematoma Sinuses: Mild mucosal edema right maxillary sinus. No air-fluid level. Soft tissues: Mild soft tissue swelling right supraorbital region. Improving soft tissue swelling left supraorbital region. CT CERVICAL SPINE FINDINGS Alignment: Normal Skull base and vertebrae: Negative for fracture Soft tissues and spinal canal: Negative Disc levels: Disc degeneration and mild spurring throughout the cervical spine. Mild diffuse facet degeneration. Upper chest: Lung apices clear bilaterally. Atherosclerotic aortic arch. Other: None IMPRESSION: 1. Acute left-sided subdural hematoma measuring up to 7 mm in the left posterior temporal region. Smaller subdural hematoma extends into the left frontal lobe. No midline shift. CT head 3 days ago demonstrated small interhemispheric subdural hematoma which shows mild improvement. 2. Atrophy and extensive chronic microvascular ischemic change. No acute infarct. 3. Negative for facial fracture 4. Negative for cervical spine fracture. 5. These results were called by telephone at the time of interpretation on 03/21/2019 at 8:58 am to provider Lorre Nick , who verbally acknowledged these results. Electronically Signed   By: Marlan Palau M.D.   On: 03/21/2019  08:58   CT Cervical Spine Wo Contrast  Result Date: 03/21/2019 CLINICAL DATA:  Unwitnessed fall.  Facial trauma. EXAM: CT HEAD WITHOUT CONTRAST CT MAXILLOFACIAL WITHOUT CONTRAST CT CERVICAL SPINE WITHOUT CONTRAST TECHNIQUE: Multidetector CT imaging of the head, cervical spine, and maxillofacial structures were performed using the standard protocol without intravenous contrast. Multiplanar CT image reconstructions of the cervical spine and maxillofacial structures were also generated. COMPARISON:  CT 03/12/2019 FINDINGS: CT HEAD FINDINGS Brain: Acute subdural hematoma on the left has developed since the prior study. This measures approximately 3 mm anteriorly. Focal high density hematoma left posterior temporal lobe measures 7 mm in thickness. Resolving small interhemispheric subdural hematoma anteriorly compared with the prior study. Generalized atrophy with extensive chronic microvascular ischemic change in the white matter. No acute ischemic infarct. 8 mm partially calcified extra-axial mass above the right orbit unchanged compatible with meningioma. Vascular: Negative for hyperdense vessel. Skull: Negative for skull fracture. Other: None CT MAXILLOFACIAL FINDINGS Osseous: Negative for facial fracture. Orbits: Bilateral cataract extraction.  No orbital mass or hematoma Sinuses: Mild mucosal edema right maxillary sinus. No air-fluid level. Soft tissues: Mild soft tissue swelling right supraorbital region. Improving soft tissue swelling left supraorbital region. CT CERVICAL SPINE FINDINGS Alignment: Normal Skull base and vertebrae: Negative for fracture Soft tissues and spinal canal: Negative Disc levels: Disc degeneration and mild spurring throughout the cervical spine. Mild diffuse facet degeneration.  Upper chest: Lung apices clear bilaterally. Atherosclerotic aortic arch. Other: None IMPRESSION: 1. Acute left-sided subdural hematoma measuring up to 7 mm in the left posterior temporal region. Smaller subdural  hematoma extends into the left frontal lobe. No midline shift. CT head 3 days ago demonstrated small interhemispheric subdural hematoma which shows mild improvement. 2. Atrophy and extensive chronic microvascular ischemic change. No acute infarct. 3. Negative for facial fracture 4. Negative for cervical spine fracture. 5. These results were called by telephone at the time of interpretation on 03/21/2019 at 8:58 am to provider Lorre Nick , who verbally acknowledged these results. Electronically Signed   By: Marlan Palau M.D.   On: 03/21/2019 08:58   CT Maxillofacial WO CM  Result Date: 03/21/2019 CLINICAL DATA:  Unwitnessed fall.  Facial trauma. EXAM: CT HEAD WITHOUT CONTRAST CT MAXILLOFACIAL WITHOUT CONTRAST CT CERVICAL SPINE WITHOUT CONTRAST TECHNIQUE: Multidetector CT imaging of the head, cervical spine, and maxillofacial structures were performed using the standard protocol without intravenous contrast. Multiplanar CT image reconstructions of the cervical spine and maxillofacial structures were also generated. COMPARISON:  CT 03/12/2019 FINDINGS: CT HEAD FINDINGS Brain: Acute subdural hematoma on the left has developed since the prior study. This measures approximately 3 mm anteriorly. Focal high density hematoma left posterior temporal lobe measures 7 mm in thickness. Resolving small interhemispheric subdural hematoma anteriorly compared with the prior study. Generalized atrophy with extensive chronic microvascular ischemic change in the white matter. No acute ischemic infarct. 8 mm partially calcified extra-axial mass above the right orbit unchanged compatible with meningioma. Vascular: Negative for hyperdense vessel. Skull: Negative for skull fracture. Other: None CT MAXILLOFACIAL FINDINGS Osseous: Negative for facial fracture. Orbits: Bilateral cataract extraction.  No orbital mass or hematoma Sinuses: Mild mucosal edema right maxillary sinus. No air-fluid level. Soft tissues: Mild soft tissue  swelling right supraorbital region. Improving soft tissue swelling left supraorbital region. CT CERVICAL SPINE FINDINGS Alignment: Normal Skull base and vertebrae: Negative for fracture Soft tissues and spinal canal: Negative Disc levels: Disc degeneration and mild spurring throughout the cervical spine. Mild diffuse facet degeneration. Upper chest: Lung apices clear bilaterally. Atherosclerotic aortic arch. Other: None IMPRESSION: 1. Acute left-sided subdural hematoma measuring up to 7 mm in the left posterior temporal region. Smaller subdural hematoma extends into the left frontal lobe. No midline shift. CT head 3 days ago demonstrated small interhemispheric subdural hematoma which shows mild improvement. 2. Atrophy and extensive chronic microvascular ischemic change. No acute infarct. 3. Negative for facial fracture 4. Negative for cervical spine fracture. 5. These results were called by telephone at the time of interpretation on 03/21/2019 at 8:58 am to provider Lorre Nick , who verbally acknowledged these results. Electronically Signed   By: Marlan Palau M.D.   On: 03/21/2019 08:58     Assessment/Plan: Very pleasant 86 year old comes in today after sustaining a fall and striking her head on a desk.  CT head revealed an acute left-sided subdural hematoma measuring 7 mm in size along the left posterior temporal region.  No mass-effect or midline shift.  Would recommend repeat head CT.  No anticoagulants at this time.  No acute surgical intervention needed at this time.     [name] 03/21/2019 1:13 PM  Agree w above

## 2019-03-21 NOTE — Care Management Obs Status (Signed)
MEDICARE OBSERVATION STATUS NOTIFICATION   Patient Details  Name: [name] MRN: [id number] Date of Birth: 02/14/1923   Medicare Observation Status Notification Given:  Yes    Lanier Clam, RN 03/21/2019, 3:23 PM

## 2019-03-21 NOTE — ED Triage Notes (Signed)
Per EMS: Pt is coming from [facility] with c/o fall. Patient had an unwitnessed fall when she tried to use the restroom. At first patient stated she did hit her head on the ground and then stated she did not. Pt is having bilateral knee pain with no obvious deformities. Pt has old bruising from previous falls on her left eye and upper right arm. Pt has an abrasion above her right eye that could be new. Pt is not on blood thinners. Pt is A&Ox2, oriented to person and place.  EMS VITALS:  BP 128/76 HR 88 RR 18 SPO2 94% CBG 97 TEMP 97.3

## 2019-03-21 NOTE — ED Notes (Signed)
Family at bedside. 

## 2019-03-22 ENCOUNTER — Observation Stay (HOSPITAL_COMMUNITY): Payer: Medicare Other

## 2019-03-22 DIAGNOSIS — S065X9A Traumatic subdural hemorrhage with loss of consciousness of unspecified duration, initial encounter: Secondary | ICD-10-CM

## 2019-03-22 LAB — BASIC METABOLIC PANEL
Anion gap: 11 (ref 5–15)
BUN: 16 mg/dL (ref 8–23)
CO2: 24 mmol/L (ref 22–32)
Calcium: 9.2 mg/dL (ref 8.9–10.3)
Chloride: 102 mmol/L (ref 98–111)
Creatinine, Ser: 0.93 mg/dL (ref 0.44–1.00)
GFR calc Af Amer: >60 mL/min (ref >60)
GFR calc non Af Amer: 53 mL/min — ABNORMAL LOW (ref >60)
Glucose, Bld: 106 mg/dL — ABNORMAL HIGH (ref 70–99)
Potassium: 3.8 mmol/L (ref 3.5–5.1)
Sodium: 137 mmol/L (ref 135–145)

## 2019-03-22 LAB — MAGNESIUM: Magnesium: 1.8 mg/dL (ref 1.7–2.4)

## 2019-03-22 LAB — CBC
HCT: 39.2 % (ref 36.0–46.0)
Hemoglobin: 12.6 g/dL (ref 12.0–15.0)
MCH: 28.9 pg (ref 26.0–34.0)
MCHC: 32.1 g/dL (ref 30.0–36.0)
MCV: 89.9 fL (ref 80.0–100.0)
Platelets: 319 10*3/uL (ref 150–400)
RBC: 4.36 MIL/uL (ref 3.87–5.11)
RDW: 13.6 % (ref 11.5–15.5)
WBC: 11.9 10*3/uL — ABNORMAL HIGH (ref 4.0–10.5)
nRBC: 0 % (ref 0.0–0.2)

## 2019-03-22 NOTE — Discharge Summary (Signed)
Physician Discharge Summary  [name] ERD:[id number] DOB: [birth year] DOA: 03/21/2019  PCP: Soledad Gerlach, MD  Admit date: 03/21/2019 Discharge date: 03/22/2019  Admitted From: ALF Disposition:  ALF memory care unit   Recommendations for Outpatient Follow-up:  1. Follow up with PCP in 1-2 weeks   Home Health:PT  Equipment/Devices:none    Discharge Condition:fair   CODE STATUS:DNR Diet recommendation: low salt diet   Discharge Summary: 86 year old female with history of dementia from memory care unit, essential hypertension, hyperlipidemia and GERD presented to the emergency room with 2 episodes of fall.  Patient fell while getting out of bed striking the window.  She had fallen 4 days ago onto the tile floor in her bathroom and had visited emergency room, found to have a small subdural hematoma and discharged back. In the emergency room hemodynamically stable.  CT head with acute left subdural hematoma 7 mm left posterior temporal region, small subdural hematoma extending to the left frontal lobe, no midline shift.  Evaluated by neurosurgery recommended conservative management.  Repeat CT scan today morning with stable findings.  Patient is not on any blood thinners.  Platelets are adequate.  Monitored overnight in the hospital with no new neurological findings.  No focal neurological deficit.  Repeat CT scan with improvement. Discharge back to memory care unit at assisted living facility, continue all previous medications with no changes. Avoid NSAID, blood thinners. Patient was ambulated by physical therapy, she had some difficulty ambulation and will benefit with ongoing therapy evaluation/treatment at ALF.  Home physical therapy was ordered.   Discharge Diagnoses:  Principal Problem:   Subdural hematoma (HCC) Active Problems:   Essential hypertension   Dementia without behavioral disturbance (HCC)   Hyperlipidemia   Hypokalemia    Discharge Instructions  Discharge  Instructions    Diet - low sodium heart healthy   Complete by: As directed    Discharge instructions   Complete by: As directed    All time fall precautions   Increase activity slowly   Complete by: As directed      Allergies as of 03/22/2019      Reactions   Penicillins Other (See Comments)   unknown      Medication List    TAKE these medications   acetaminophen 325 MG tablet Commonly known as: TYLENOL Take 650 mg by mouth every 6 (six) hours as needed for mild pain or headache.   guaiFENesin-codeine 100-10 MG/5ML syrup Commonly known as: ROBITUSSIN AC Take 5 mLs by mouth every 8 (eight) hours as needed for cough.   losartan-hydrochlorothiazide 100-25 MG tablet Commonly known as: HYZAAR Take 1 tablet by mouth daily.   Melatonin 5 MG Tabs Take 5 mg by mouth at bedtime as needed (sleep).   omeprazole 20 MG capsule Commonly known as: PRILOSEC Take 20 mg by mouth every other day.   pravastatin 10 MG tablet Commonly known as: PRAVACHOL Take 10 mg by mouth daily.   traZODone 50 MG tablet Commonly known as: DESYREL Take 50 mg by mouth at bedtime.      Contact information for after-discharge care    Destination    [facility] .   Service: Assisted Living Contact information: [address] [place] [address] [phone]             Allergies  Allergen Reactions  . Penicillins Other (See Comments)    unknown    Consultations:  Neurosurgery   Procedures/Studies: CT HEAD WO CONTRAST  Result  Date: 03/22/2019 CLINICAL DATA:  Subdural hematoma EXAM: CT HEAD WITHOUT CONTRAST TECHNIQUE: Contiguous axial images were obtained from the base of the skull through the vertex without intravenous contrast. COMPARISON:  CT head 03/21/2019 FINDINGS: Brain: Left-sided subdural hematoma slightly smaller. High-density subdural hematoma in the left posterior temporal lobe measures 5.4 mm in thickness. Anterior extension appears  slightly improved. Small interhemispheric subdural hematoma unchanged. Atrophy and extensive chronic microvascular ischemic changes in the white matter. No acute infarct. 8 mm calcified subfrontal mass on the right compatible with meningioma is stable. Vascular: Negative for hyperdense vessel. Skull: Negative Sinuses/Orbits: Negative Other: None IMPRESSION: Left-sided subdural hematoma slightly improved. Inter hemispheric small subdural hematoma unchanged. Electronically Signed   By: Franchot Gallo M.D.   On: 03/22/2019 09:00   CT Head Wo Contrast  Result Date: 03/21/2019 CLINICAL DATA:  Unwitnessed fall.  Facial trauma. EXAM: CT HEAD WITHOUT CONTRAST CT MAXILLOFACIAL WITHOUT CONTRAST CT CERVICAL SPINE WITHOUT CONTRAST TECHNIQUE: Multidetector CT imaging of the head, cervical spine, and maxillofacial structures were performed using the standard protocol without intravenous contrast. Multiplanar CT image reconstructions of the cervical spine and maxillofacial structures were also generated. COMPARISON:  CT 03/12/2019 FINDINGS: CT HEAD FINDINGS Brain: Acute subdural hematoma on the left has developed since the prior study. This measures approximately 3 mm anteriorly. Focal high density hematoma left posterior temporal lobe measures 7 mm in thickness. Resolving small interhemispheric subdural hematoma anteriorly compared with the prior study. Generalized atrophy with extensive chronic microvascular ischemic change in the white matter. No acute ischemic infarct. 8 mm partially calcified extra-axial mass above the right orbit unchanged compatible with meningioma. Vascular: Negative for hyperdense vessel. Skull: Negative for skull fracture. Other: None CT MAXILLOFACIAL FINDINGS Osseous: Negative for facial fracture. Orbits: Bilateral cataract extraction.  No orbital mass or hematoma Sinuses: Mild mucosal edema right maxillary sinus. No air-fluid level. Soft tissues: Mild soft tissue swelling right supraorbital region.  Improving soft tissue swelling left supraorbital region. CT CERVICAL SPINE FINDINGS Alignment: Normal Skull base and vertebrae: Negative for fracture Soft tissues and spinal canal: Negative Disc levels: Disc degeneration and mild spurring throughout the cervical spine. Mild diffuse facet degeneration. Upper chest: Lung apices clear bilaterally. Atherosclerotic aortic arch. Other: None IMPRESSION: 1. Acute left-sided subdural hematoma measuring up to 7 mm in the left posterior temporal region. Smaller subdural hematoma extends into the left frontal lobe. No midline shift. CT head 3 days ago demonstrated small interhemispheric subdural hematoma which shows mild improvement. 2. Atrophy and extensive chronic microvascular ischemic change. No acute infarct. 3. Negative for facial fracture 4. Negative for cervical spine fracture. 5. These results were called by telephone at the time of interpretation on 03/21/2019 at 8:58 am to provider Lacretia Leigh , who verbally acknowledged these results. Electronically Signed   By: Franchot Gallo M.D.   On: 03/21/2019 08:58   CT Head Wo Contrast  Result Date: 03/17/2019 CLINICAL DATA:  Fall with facial trauma. Abrasions and small hematoma. EXAM: CT HEAD WITHOUT CONTRAST CT MAXILLOFACIAL WITHOUT CONTRAST CT CERVICAL SPINE WITHOUT CONTRAST TECHNIQUE: Multidetector CT imaging of the head, cervical spine, and maxillofacial structures were performed using the standard protocol without intravenous contrast. Multiplanar CT image reconstructions of the cervical spine and maxillofacial structures were also generated. COMPARISON:  None. FINDINGS: CT HEAD FINDINGS Brain: Trace subdural hematoma anteriorly along the falx measures up to 3 mm in thickness. An 8 mm partially calcified mass along the floor of the anterior cranial fossa on the right is likely extra-axial and  is without significant associated mass effect or edema. No acute cortically based infarct is evident. Confluent  hypodensities in the cerebral white matter bilaterally are nonspecific but compatible with extensive chronic small vessel ischemic disease. A small chronic infarct is noted in the right frontal white matter. Vascular: Calcified atherosclerosis at the skull base. No hyperdense vessel. Skull: No fracture or suspicious osseous lesion. Other: None. CT MAXILLOFACIAL FINDINGS Osseous: No acute fracture or mandibular dislocation. Orbits: Bilateral cataract extraction. No retrobulbar hematoma. Sinuses: Trace right maxillary sinus mucosal thickening. Mild air cell opacification at the right mastoid tip, chronic in appearance. Soft tissues: Small left periorbital hematoma. CT CERVICAL SPINE FINDINGS Alignment: Trace anterolisthesis of C7 on T1, likely degenerative. Skull base and vertebrae: No acute fracture or suspicious osseous lesion. Soft tissues and spinal canal: No evidence of significant prevertebral swelling or fluid within limitations of motion artifact. Disc levels: Severe disc space narrowing at C5-6, C6-7, and T1-2. Partial ankylosis across the disc space and facets at C4-5. Advanced right-sided facet arthrosis at C3-4 and C6-7. Left facet ankylosis at C2-3. Upper chest: Motion artifact through the lung apices with mild mosaic attenuation, possibly air trapping. Aortic atherosclerosis. Other: None. IMPRESSION: 1. Trace subdural hematoma along the falx. 2. Small left periorbital hematoma. 3. No acute maxillofacial or cervical spine fracture identified. 4. Extensive chronic small vessel ischemic disease. 5. 8 mm mass along the floor of the anterior cranial fossa on the right most compatible with an incidental meningioma. Critical Value/emergent results were called by telephone at the time of interpretation on 03/17/2019 at 6:28 am to Dr. Eudelia Bunch, who verbally acknowledged these results. Electronically Signed   By: Sebastian Ache M.D.   On: 03/17/2019 06:30   CT Cervical Spine Wo Contrast  Result Date:  03/21/2019 CLINICAL DATA:  Unwitnessed fall.  Facial trauma. EXAM: CT HEAD WITHOUT CONTRAST CT MAXILLOFACIAL WITHOUT CONTRAST CT CERVICAL SPINE WITHOUT CONTRAST TECHNIQUE: Multidetector CT imaging of the head, cervical spine, and maxillofacial structures were performed using the standard protocol without intravenous contrast. Multiplanar CT image reconstructions of the cervical spine and maxillofacial structures were also generated. COMPARISON:  CT 03/12/2019 FINDINGS: CT HEAD FINDINGS Brain: Acute subdural hematoma on the left has developed since the prior study. This measures approximately 3 mm anteriorly. Focal high density hematoma left posterior temporal lobe measures 7 mm in thickness. Resolving small interhemispheric subdural hematoma anteriorly compared with the prior study. Generalized atrophy with extensive chronic microvascular ischemic change in the white matter. No acute ischemic infarct. 8 mm partially calcified extra-axial mass above the right orbit unchanged compatible with meningioma. Vascular: Negative for hyperdense vessel. Skull: Negative for skull fracture. Other: None CT MAXILLOFACIAL FINDINGS Osseous: Negative for facial fracture. Orbits: Bilateral cataract extraction.  No orbital mass or hematoma Sinuses: Mild mucosal edema right maxillary sinus. No air-fluid level. Soft tissues: Mild soft tissue swelling right supraorbital region. Improving soft tissue swelling left supraorbital region. CT CERVICAL SPINE FINDINGS Alignment: Normal Skull base and vertebrae: Negative for fracture Soft tissues and spinal canal: Negative Disc levels: Disc degeneration and mild spurring throughout the cervical spine. Mild diffuse facet degeneration. Upper chest: Lung apices clear bilaterally. Atherosclerotic aortic arch. Other: None IMPRESSION: 1. Acute left-sided subdural hematoma measuring up to 7 mm in the left posterior temporal region. Smaller subdural hematoma extends into the left frontal lobe. No midline  shift. CT head 3 days ago demonstrated small interhemispheric subdural hematoma which shows mild improvement. 2. Atrophy and extensive chronic microvascular ischemic change. No acute infarct. 3. Negative  for facial fracture 4. Negative for cervical spine fracture. 5. These results were called by telephone at the time of interpretation on 03/21/2019 at 8:58 am to provider Lorre Nick , who verbally acknowledged these results. Electronically Signed   By: Marlan Palau M.D.   On: 03/21/2019 08:58   CT Cervical Spine Wo Contrast  Result Date: 03/17/2019 CLINICAL DATA:  Fall with facial trauma. Abrasions and small hematoma. EXAM: CT HEAD WITHOUT CONTRAST CT MAXILLOFACIAL WITHOUT CONTRAST CT CERVICAL SPINE WITHOUT CONTRAST TECHNIQUE: Multidetector CT imaging of the head, cervical spine, and maxillofacial structures were performed using the standard protocol without intravenous contrast. Multiplanar CT image reconstructions of the cervical spine and maxillofacial structures were also generated. COMPARISON:  None. FINDINGS: CT HEAD FINDINGS Brain: Trace subdural hematoma anteriorly along the falx measures up to 3 mm in thickness. An 8 mm partially calcified mass along the floor of the anterior cranial fossa on the right is likely extra-axial and is without significant associated mass effect or edema. No acute cortically based infarct is evident. Confluent hypodensities in the cerebral white matter bilaterally are nonspecific but compatible with extensive chronic small vessel ischemic disease. A small chronic infarct is noted in the right frontal white matter. Vascular: Calcified atherosclerosis at the skull base. No hyperdense vessel. Skull: No fracture or suspicious osseous lesion. Other: None. CT MAXILLOFACIAL FINDINGS Osseous: No acute fracture or mandibular dislocation. Orbits: Bilateral cataract extraction. No retrobulbar hematoma. Sinuses: Trace right maxillary sinus mucosal thickening. Mild air cell  opacification at the right mastoid tip, chronic in appearance. Soft tissues: Small left periorbital hematoma. CT CERVICAL SPINE FINDINGS Alignment: Trace anterolisthesis of C7 on T1, likely degenerative. Skull base and vertebrae: No acute fracture or suspicious osseous lesion. Soft tissues and spinal canal: No evidence of significant prevertebral swelling or fluid within limitations of motion artifact. Disc levels: Severe disc space narrowing at C5-6, C6-7, and T1-2. Partial ankylosis across the disc space and facets at C4-5. Advanced right-sided facet arthrosis at C3-4 and C6-7. Left facet ankylosis at C2-3. Upper chest: Motion artifact through the lung apices with mild mosaic attenuation, possibly air trapping. Aortic atherosclerosis. Other: None. IMPRESSION: 1. Trace subdural hematoma along the falx. 2. Small left periorbital hematoma. 3. No acute maxillofacial or cervical spine fracture identified. 4. Extensive chronic small vessel ischemic disease. 5. 8 mm mass along the floor of the anterior cranial fossa on the right most compatible with an incidental meningioma. Critical Value/emergent results were called by telephone at the time of interpretation on 03/17/2019 at 6:28 am to Dr. Eudelia Bunch, who verbally acknowledged these results. Electronically Signed   By: Sebastian Ache M.D.   On: 03/17/2019 06:30   CT Maxillofacial WO CM  Result Date: 03/21/2019 CLINICAL DATA:  Unwitnessed fall.  Facial trauma. EXAM: CT HEAD WITHOUT CONTRAST CT MAXILLOFACIAL WITHOUT CONTRAST CT CERVICAL SPINE WITHOUT CONTRAST TECHNIQUE: Multidetector CT imaging of the head, cervical spine, and maxillofacial structures were performed using the standard protocol without intravenous contrast. Multiplanar CT image reconstructions of the cervical spine and maxillofacial structures were also generated. COMPARISON:  CT 03/12/2019 FINDINGS: CT HEAD FINDINGS Brain: Acute subdural hematoma on the left has developed since the prior study. This  measures approximately 3 mm anteriorly. Focal high density hematoma left posterior temporal lobe measures 7 mm in thickness. Resolving small interhemispheric subdural hematoma anteriorly compared with the prior study. Generalized atrophy with extensive chronic microvascular ischemic change in the white matter. No acute ischemic infarct. 8 mm partially calcified extra-axial mass above the right  orbit unchanged compatible with meningioma. Vascular: Negative for hyperdense vessel. Skull: Negative for skull fracture. Other: None CT MAXILLOFACIAL FINDINGS Osseous: Negative for facial fracture. Orbits: Bilateral cataract extraction.  No orbital mass or hematoma Sinuses: Mild mucosal edema right maxillary sinus. No air-fluid level. Soft tissues: Mild soft tissue swelling right supraorbital region. Improving soft tissue swelling left supraorbital region. CT CERVICAL SPINE FINDINGS Alignment: Normal Skull base and vertebrae: Negative for fracture Soft tissues and spinal canal: Negative Disc levels: Disc degeneration and mild spurring throughout the cervical spine. Mild diffuse facet degeneration. Upper chest: Lung apices clear bilaterally. Atherosclerotic aortic arch. Other: None IMPRESSION: 1. Acute left-sided subdural hematoma measuring up to 7 mm in the left posterior temporal region. Smaller subdural hematoma extends into the left frontal lobe. No midline shift. CT head 3 days ago demonstrated small interhemispheric subdural hematoma which shows mild improvement. 2. Atrophy and extensive chronic microvascular ischemic change. No acute infarct. 3. Negative for facial fracture 4. Negative for cervical spine fracture. 5. These results were called by telephone at the time of interpretation on 03/21/2019 at 8:58 am to provider Lorre NickANTHONY ALLEN , who verbally acknowledged these results. Electronically Signed   By: Marlan Palauharles  Clark M.D.   On: 03/21/2019 08:58   CT Maxillofacial Wo Contrast  Result Date: 03/17/2019 CLINICAL DATA:   Fall with facial trauma. Abrasions and small hematoma. EXAM: CT HEAD WITHOUT CONTRAST CT MAXILLOFACIAL WITHOUT CONTRAST CT CERVICAL SPINE WITHOUT CONTRAST TECHNIQUE: Multidetector CT imaging of the head, cervical spine, and maxillofacial structures were performed using the standard protocol without intravenous contrast. Multiplanar CT image reconstructions of the cervical spine and maxillofacial structures were also generated. COMPARISON:  None. FINDINGS: CT HEAD FINDINGS Brain: Trace subdural hematoma anteriorly along the falx measures up to 3 mm in thickness. An 8 mm partially calcified mass along the floor of the anterior cranial fossa on the right is likely extra-axial and is without significant associated mass effect or edema. No acute cortically based infarct is evident. Confluent hypodensities in the cerebral white matter bilaterally are nonspecific but compatible with extensive chronic small vessel ischemic disease. A small chronic infarct is noted in the right frontal white matter. Vascular: Calcified atherosclerosis at the skull base. No hyperdense vessel. Skull: No fracture or suspicious osseous lesion. Other: None. CT MAXILLOFACIAL FINDINGS Osseous: No acute fracture or mandibular dislocation. Orbits: Bilateral cataract extraction. No retrobulbar hematoma. Sinuses: Trace right maxillary sinus mucosal thickening. Mild air cell opacification at the right mastoid tip, chronic in appearance. Soft tissues: Small left periorbital hematoma. CT CERVICAL SPINE FINDINGS Alignment: Trace anterolisthesis of C7 on T1, likely degenerative. Skull base and vertebrae: No acute fracture or suspicious osseous lesion. Soft tissues and spinal canal: No evidence of significant prevertebral swelling or fluid within limitations of motion artifact. Disc levels: Severe disc space narrowing at C5-6, C6-7, and T1-2. Partial ankylosis across the disc space and facets at C4-5. Advanced right-sided facet arthrosis at C3-4 and C6-7.  Left facet ankylosis at C2-3. Upper chest: Motion artifact through the lung apices with mild mosaic attenuation, possibly air trapping. Aortic atherosclerosis. Other: None. IMPRESSION: 1. Trace subdural hematoma along the falx. 2. Small left periorbital hematoma. 3. No acute maxillofacial or cervical spine fracture identified. 4. Extensive chronic small vessel ischemic disease. 5. 8 mm mass along the floor of the anterior cranial fossa on the right most compatible with an incidental meningioma. Critical Value/emergent results were called by telephone at the time of interpretation on 03/17/2019 at 6:28 am to Dr. Eudelia Bunchardama, who  verbally acknowledged these results. Electronically Signed   By: Sebastian Ache M.D.   On: 03/17/2019 06:30     Subjective: Patient seen and examined.  Patient's niece at the bedside. Patient is very pleasant.  She still thinks she lives in her townhouse in [place]. No overnight events.  She is wanting to go back home.   Discharge Exam: Vitals:   03/21/19 2017 03/22/19 0536  BP: 126/64 117/64  Pulse: 95 80  Resp: 18 18  Temp: (!) 97.3 F (36.3 C) 98 F (36.7 C)  SpO2: 93% 95%   Vitals:   03/21/19 1130 03/21/19 1257 03/21/19 2017 03/22/19 0536  BP: 123/63 (!) 128/59 126/64 117/64  Pulse: 70 78 95 80  Resp: 17 16 18 18   Temp:  98 F (36.7 C) (!) 97.3 F (36.3 C) 98 F (36.7 C)  TempSrc:  Oral Oral Oral  SpO2: 92% 98% 93% 95%  Weight:      Height:        General: Pt is alert, awake, not in acute distress Patient has some bruises on the left superior eyelid and lips. Alert and awake, pleasantly confused. Cardiovascular: RRR, S1/S2 +, no rubs, no gallops Respiratory: CTA bilaterally, no wheezing, no rhonchi Abdominal: Soft, NT, ND, bowel sounds + Extremities: no edema, no cyanosis    The results of significant diagnostics from this hospitalization (including imaging, microbiology, ancillary and laboratory) are listed below for reference.      Microbiology: Recent Results (from the past 240 hour(s))  SARS CORONAVIRUS 2 (TAT 6-24 HRS) Nasopharyngeal Nasopharyngeal Swab     Status: None   Collection Time: 03/21/19  9:27 AM   Specimen: Nasopharyngeal Swab  Result Value Ref Range Status   SARS Coronavirus 2 NEGATIVE NEGATIVE Final    Comment: (NOTE) SARS-CoV-2 target nucleic acids are NOT DETECTED. The SARS-CoV-2 RNA is generally detectable in upper and lower respiratory specimens during the acute phase of infection. Negative results do not preclude SARS-CoV-2 infection, do not rule out co-infections with other pathogens, and should not be used as the sole basis for treatment or other patient management decisions. Negative results must be combined with clinical observations, patient history, and epidemiological information. The expected result is Negative. Fact Sheet for Patients: HairSlick.no Fact Sheet for Healthcare Providers: quierodirigir.com This test is not yet approved or cleared by the [place] FDA and  has been authorized for detection and/or diagnosis of SARS-CoV-2 by FDA under an Emergency Use Authorization (EUA). This EUA will remain  in effect (meaning this test can be used) for the duration of the COVID-19 declaration under Section 56 4(b)(1) of the Act, 21 U.S.C. section 360bbb-3(b)(1), unless the authorization is terminated or revoked sooner. Performed at [facility], [address], [place], [place] [address]   MRSA PCR Screening     Status: None   Collection Time: 03/21/19  1:38 PM   Specimen: Nasal Mucosa; Nasopharyngeal  Result Value Ref Range Status   MRSA by PCR NEGATIVE NEGATIVE Final    Comment:        The GeneXpert MRSA Assay (FDA approved for NASAL specimens only), is one component of a comprehensive MRSA colonization surveillance program. It is not intended to diagnose MRSA infection nor to guide or monitor treatment  for MRSA infections. Performed at [facility], [address] [place], [place] [address]      Labs: BNP (last 3 results) No results for input(s): BNP in the last 8760 hours. Basic Metabolic Panel:  Recent Labs  Lab 03/21/19 0927 03/22/19 0520  NA 138 137  K 3.1* 3.8  CL 102 102  CO2 25 24  GLUCOSE 113* 106*  BUN 17 16  CREATININE 0.91 0.93  CALCIUM 8.6* 9.2  MG  --  1.8   Liver Function Tests: No results for input(s): AST, ALT, ALKPHOS, BILITOT, PROT, ALBUMIN in the last 168 hours. No results for input(s): LIPASE, AMYLASE in the last 168 hours. No results for input(s): AMMONIA in the last 168 hours. CBC: Recent Labs  Lab 03/21/19 0927 03/22/19 0520  WBC 10.0 11.9*  NEUTROABS 7.1  --   HGB 12.9 12.6  HCT 39.5 39.2  MCV 88.8 89.9  PLT 300 319   Cardiac Enzymes: No results for input(s): CKTOTAL, CKMB, CKMBINDEX, TROPONINI in the last 168 hours. BNP: Invalid input(s): POCBNP CBG: Recent Labs  Lab 03/17/19 0451  GLUCAP 102*   D-Dimer No results for input(s): DDIMER in the last 72 hours. Hgb A1c No results for input(s): HGBA1C in the last 72 hours. Lipid Profile No results for input(s): CHOL, HDL, LDLCALC, TRIG, CHOLHDL, LDLDIRECT in the last 72 hours. Thyroid function studies Recent Labs    03/21/19 1404  TSH 3.657   Anemia work up No results for input(s): VITAMINB12, FOLATE, FERRITIN, TIBC, IRON, RETICCTPCT in the last 72 hours. Urinalysis No results found for: COLORURINE, APPEARANCEUR, LABSPEC, PHURINE, GLUCOSEU, HGBUR, BILIRUBINUR, KETONESUR, PROTEINUR, UROBILINOGEN, NITRITE, LEUKOCYTESUR Sepsis Labs Invalid input(s): PROCALCITONIN,  WBC,  LACTICIDVEN Microbiology Recent Results (from the past 240 hour(s))  SARS CORONAVIRUS 2 (TAT 6-24 HRS) Nasopharyngeal Nasopharyngeal Swab     Status: None   Collection Time: 03/21/19  9:27 AM   Specimen: Nasopharyngeal Swab  Result Value Ref Range Status   SARS Coronavirus 2 NEGATIVE  NEGATIVE Final    Comment: (NOTE) SARS-CoV-2 target nucleic acids are NOT DETECTED. The SARS-CoV-2 RNA is generally detectable in upper and lower respiratory specimens during the acute phase of infection. Negative results do not preclude SARS-CoV-2 infection, do not rule out co-infections with other pathogens, and should not be used as the sole basis for treatment or other patient management decisions. Negative results must be combined with clinical observations, patient history, and epidemiological information. The expected result is Negative. Fact Sheet for Patients: HairSlick.nohttps://www.fda.gov/media/138098/download Fact Sheet for Healthcare Providers: quierodirigir.comhttps://www.fda.gov/media/138095/download This test is not yet approved or cleared by the [place] FDA and  has been authorized for detection and/or diagnosis of SARS-CoV-2 by FDA under an Emergency Use Authorization (EUA). This EUA will remain  in effect (meaning this test can be used) for the duration of the COVID-19 declaration under Section 56 4(b)(1) of the Act, 21 U.S.C. section 360bbb-3(b)(1), unless the authorization is terminated or revoked sooner. Performed at [facility], [address] [place], [place] [address]   MRSA PCR Screening     Status: None   Collection Time: 03/21/19  1:38 PM   Specimen: Nasal Mucosa; Nasopharyngeal  Result Value Ref Range Status   MRSA by PCR NEGATIVE NEGATIVE Final    Comment:        The GeneXpert MRSA Assay (FDA approved for NASAL specimens only), is one component of a comprehensive MRSA colonization surveillance program. It is not intended to diagnose MRSA infection nor to guide or monitor treatment for MRSA infections. Performed at [facility], [address] [place], [place] [address]      Time coordinating discharge:  35 minutes  SIGNED:   Dorcas CarrowKuber Noal Abshier, MD  [facility] 03/22/2019, 10:58 AM

## 2019-03-22 NOTE — TOC Transition Note (Addendum)
Transition of Care [facility]) - CM/SW Discharge Note   Patient Details  Name: [name] MRN: [id number] Date of Birth: 1922-08-20  [facility]) CM/SW Contact:  Ida Rogue, LCSW Phone Number: 03/22/2019, 11:41 AM   Clinical Narrative:  Patient to return to [facility] today.  PTAR set up. Paperwork faxed.  Nursing, please call Burna Mortimer at [phone] for report.  TOC sign off.   Addendum:  Katie RN reports that patient's niece will transport.  PTAR will NOT be used. Rn will send with DNR paperwork.    Final next level of care: Memory Care Barriers to Discharge: No Barriers Identified   Patient Goals and CMS Choice        Discharge Placement                       Discharge Plan and Services   Discharge Planning Services: CM Consult                                 Social Determinants of Health (SDOH) Interventions     Readmission Risk Interventions No flowsheet data found.

## 2019-03-22 NOTE — Evaluation (Signed)
Physical Therapy Evaluation Patient Details Name: [name] MRN: [id number] DOB: 09-29-1922 Today's Date: 03/22/2019   History of Present Illness  Pt is 86 y.o. female with medical history significant of dementia, essential hypertension, hyperlipidemia, GERD who presents from [facility], SNF/memory care for recurrent fall.  Pt with subdural hematoma that is improving per CT, no midline shift.  Seen by neurosurgery and no surgery indicated.  Clinical Impression   Pt admitted with above diagnosis. Pt was able to ambulate 25' x 2 with min assist.  Needs cues for safety and AD use.  Pt with history of falls and is likely near her baseline (unable to provide PLOF), but will benefit from PT to advance mobility and safety as able while hospitalized. Pt currently with functional limitations due to the deficits listed below (see PT Problem List). Pt will benefit from skilled PT to increase their independence and safety with mobility to allow discharge to the venue listed below.       Follow Up Recommendations Other (comment)(from long term care)    Equipment Recommendations  Other (comment)(defer to facility)    Recommendations for Other Services       Precautions / Restrictions Precautions Precautions: Fall      Mobility  Bed Mobility Overal bed mobility: Needs Assistance Bed Mobility: Supine to Sit;Sit to Supine     Supine to sit: Min assist Sit to supine: Min assist   General bed mobility comments: increased time and encouragement  Transfers Overall transfer level: Needs assistance Equipment used: Rolling walker (2 wheeled) Transfers: Sit to/from Stand Sit to Stand: Min assist         General transfer comment: sit to stand x 3, cues for safe hand placement  Ambulation/Gait Ambulation/Gait assistance: Min assist Gait Distance (Feet): 25 Feet Assistive device: Rolling walker (2 wheeled) Gait Pattern/deviations: Decreased stride length;Narrow base of support Gait  velocity: decreased   General Gait Details: cues for use of RW; pt tending to let go of walker to soon and needs assist to keep it with her; ambulated 25'x2  Stairs            Wheelchair Mobility    Modified Rankin (Stroke Patients Only)       Balance Overall balance assessment: Needs assistance Sitting-balance support: Bilateral upper extremity supported;Feet supported Sitting balance-Leahy Scale: Fair     Standing balance support: Bilateral upper extremity supported;During functional activity Standing balance-Leahy Scale: Fair                               Pertinent Vitals/Pain Pain Assessment: No/denies pain    Home Living Family/patient expects to be discharged to:: Other (Comment)(long term memory unit)                      Prior Function Level of Independence: Needs assistance         Comments: Pt unreliable historian.  She does walk at facility but has had multiple falls.  Likely needs assist with ADLs due to dementia.     Hand Dominance        Extremity/Trunk Assessment   Upper Extremity Assessment Upper Extremity Assessment: Defer to OT evaluation(did note decreased shoulder elevation on L)    Lower Extremity Assessment Lower Extremity Assessment: LLE deficits/detail;RLE deficits/detail RLE Deficits / Details: ROM WFL; strength 4/5 LLE Deficits / Details: ROM WFL; strength 4/5    Cervical / Trunk Assessment Cervical / Trunk Assessment: Kyphotic  Communication   Communication: No difficulties  Cognition Arousal/Alertness: Awake/alert Behavior During Therapy: WFL for tasks assessed/performed Overall Cognitive Status: No family/caregiver present to determine baseline cognitive functioning                                 General Comments: Pt confused, oriented to self only, unable to provide PLOF, does follow 1 step commands consistently      General Comments General comments (skin integrity, edema,  etc.): VSS; noted ecchymosis bil knees, chest, and face from frequent falls.  Required min A for balance with toileting ADLs.    Exercises     Assessment/Plan    PT Assessment Patient needs continued PT services  PT Problem List Decreased strength;Decreased mobility;Decreased safety awareness;Decreased activity tolerance;Decreased knowledge of precautions;Decreased balance;Decreased knowledge of use of DME;Decreased range of motion       PT Treatment Interventions DME instruction;Therapeutic activities;Gait training;Therapeutic exercise;Patient/family education;Balance training;Functional mobility training    PT Goals (Current goals can be found in the Care Plan section)  Acute Rehab PT Goals Patient Stated Goal: "get some rest" PT Goal Formulation: With patient Time For Goal Achievement: 04/05/19 Potential to Achieve Goals: Good    Frequency Min 2X/week   Barriers to discharge        Co-evaluation               AM-PAC PT "6 Clicks" Mobility  Outcome Measure Help needed turning from your back to your side while in a flat bed without using bedrails?: None Help needed moving from lying on your back to sitting on the side of a flat bed without using bedrails?: None Help needed moving to and from a bed to a chair (including a wheelchair)?: None Help needed standing up from a chair using your arms (e.g., wheelchair or bedside chair)?: A Little Help needed to walk in hospital room?: A Little Help needed climbing 3-5 steps with a railing? : A Lot 6 Click Score: 20    End of Session Equipment Utilized During Treatment: Gait belt Activity Tolerance: Patient tolerated treatment well Patient left: in bed;with call bell/phone within reach;with bed alarm set(declined sitting in chair) Nurse Communication: Mobility status PT Visit Diagnosis: Unsteadiness on feet (R26.81);History of falling (Z91.81)    Time: 8003-4917 PT Time Calculation (min) (ACUTE ONLY): 30 min   Charges:    PT Evaluation $PT Eval Low Complexity: 1 Low PT Treatments $Gait Training: 8-22 mins        Maggie Font, PT Acute Rehab Services Pager [phone] [facility] [phone] [facility] [phone]   Karlton Lemon 03/22/2019, 10:43 AM

## 2019-03-22 NOTE — Progress Notes (Signed)
Report called to Joni Reining RN at [facility].  All questions answered.  VSS.  Patient niece to transport patient back.

## 2019-03-22 NOTE — Progress Notes (Signed)
OT Cancellation Note  Patient Details Name: [name] MRN: [id number] DOB: Aug 09, 1922   Cancelled Treatment:    Reason Eval/Treat Not Completed: Other (comment)(defer to Long Term Care Facility) Pt already set to dc back to long term care facility. Will defer OT evaluation to [facility].   Evern Bio Shilo Pauwels 03/22/2019, 1:44 PM   Nyoka Cowden OTR/L Acute Rehabilitation Services Pager: [phone] Office: [phone]

## 2019-05-02 ENCOUNTER — Emergency Department (HOSPITAL_COMMUNITY): Payer: Medicare Other

## 2019-05-02 ENCOUNTER — Other Ambulatory Visit: Payer: Self-pay

## 2019-05-02 ENCOUNTER — Inpatient Hospital Stay (HOSPITAL_COMMUNITY)
Admission: EM | Admit: 2019-05-02 | Discharge: 2019-05-07 | DRG: 482 | Disposition: A | Discharge status: Skilled Nursing Facility | Payer: Medicare Other | Source: Skilled Nursing Facility | Attending: Internal Medicine | Admitting: Internal Medicine

## 2019-05-02 DIAGNOSIS — E559 Vitamin D deficiency, unspecified: Secondary | ICD-10-CM | POA: Diagnosis present

## 2019-05-02 DIAGNOSIS — W010XXA Fall on same level from slipping, tripping and stumbling without subsequent striking against object, initial encounter: Secondary | ICD-10-CM | POA: Diagnosis present

## 2019-05-02 DIAGNOSIS — E876 Hypokalemia: Secondary | ICD-10-CM | POA: Diagnosis present

## 2019-05-02 DIAGNOSIS — E785 Hyperlipidemia, unspecified: Secondary | ICD-10-CM | POA: Diagnosis present

## 2019-05-02 DIAGNOSIS — K219 Gastro-esophageal reflux disease without esophagitis: Secondary | ICD-10-CM | POA: Diagnosis present

## 2019-05-02 DIAGNOSIS — S72031A Displaced midcervical fracture of right femur, initial encounter for closed fracture: Principal | ICD-10-CM | POA: Diagnosis present

## 2019-05-02 DIAGNOSIS — Z87891 Personal history of nicotine dependence: Secondary | ICD-10-CM | POA: Diagnosis not present

## 2019-05-02 DIAGNOSIS — R531 Weakness: Secondary | ICD-10-CM | POA: Diagnosis not present

## 2019-05-02 DIAGNOSIS — D72829 Elevated white blood cell count, unspecified: Secondary | ICD-10-CM | POA: Diagnosis present

## 2019-05-02 DIAGNOSIS — R296 Repeated falls: Secondary | ICD-10-CM | POA: Diagnosis present

## 2019-05-02 DIAGNOSIS — Y92098 Other place in other non-institutional residence as the place of occurrence of the external cause: Secondary | ICD-10-CM | POA: Diagnosis not present

## 2019-05-02 DIAGNOSIS — Z515 Encounter for palliative care: Secondary | ICD-10-CM | POA: Diagnosis not present

## 2019-05-02 DIAGNOSIS — G309 Alzheimer's disease, unspecified: Secondary | ICD-10-CM | POA: Diagnosis present

## 2019-05-02 DIAGNOSIS — F0280 Dementia in other diseases classified elsewhere without behavioral disturbance: Secondary | ICD-10-CM | POA: Diagnosis present

## 2019-05-02 DIAGNOSIS — Z7189 Other specified counseling: Secondary | ICD-10-CM | POA: Diagnosis not present

## 2019-05-02 DIAGNOSIS — S51011A Laceration without foreign body of right elbow, initial encounter: Secondary | ICD-10-CM | POA: Diagnosis present

## 2019-05-02 DIAGNOSIS — I10 Essential (primary) hypertension: Secondary | ICD-10-CM | POA: Diagnosis present

## 2019-05-02 DIAGNOSIS — S42401A Unspecified fracture of lower end of right humerus, initial encounter for closed fracture: Secondary | ICD-10-CM

## 2019-05-02 DIAGNOSIS — F0390 Unspecified dementia without behavioral disturbance: Secondary | ICD-10-CM | POA: Diagnosis not present

## 2019-05-02 DIAGNOSIS — S52124A Nondisplaced fracture of head of right radius, initial encounter for closed fracture: Secondary | ICD-10-CM | POA: Diagnosis present

## 2019-05-02 DIAGNOSIS — Z9181 History of falling: Secondary | ICD-10-CM | POA: Diagnosis not present

## 2019-05-02 DIAGNOSIS — Z66 Do not resuscitate: Secondary | ICD-10-CM | POA: Diagnosis present

## 2019-05-02 DIAGNOSIS — W19XXXA Unspecified fall, initial encounter: Secondary | ICD-10-CM | POA: Diagnosis not present

## 2019-05-02 DIAGNOSIS — Z20822 Contact with and (suspected) exposure to covid-19: Secondary | ICD-10-CM | POA: Diagnosis present

## 2019-05-02 DIAGNOSIS — M25551 Pain in right hip: Secondary | ICD-10-CM | POA: Diagnosis present

## 2019-05-02 DIAGNOSIS — S72001A Fracture of unspecified part of neck of right femur, initial encounter for closed fracture: Secondary | ICD-10-CM | POA: Diagnosis present

## 2019-05-02 DIAGNOSIS — S72001D Fracture of unspecified part of neck of right femur, subsequent encounter for closed fracture with routine healing: Secondary | ICD-10-CM | POA: Diagnosis not present

## 2019-05-02 LAB — COMPREHENSIVE METABOLIC PANEL
ALT: 18 U/L (ref 0–44)
AST: 24 U/L (ref 15–41)
Albumin: 4.3 g/dL (ref 3.5–5.0)
Alkaline Phosphatase: 76 U/L (ref 38–126)
Anion gap: 12 (ref 5–15)
BUN: 19 mg/dL (ref 8–23)
CO2: 26 mmol/L (ref 22–32)
Calcium: 9.4 mg/dL (ref 8.9–10.3)
Chloride: 104 mmol/L (ref 98–111)
Creatinine, Ser: 0.89 mg/dL (ref 0.44–1.00)
GFR calc Af Amer: >60 mL/min (ref >60)
GFR calc non Af Amer: 55 mL/min — ABNORMAL LOW (ref >60)
Glucose, Bld: 108 mg/dL — ABNORMAL HIGH (ref 70–99)
Potassium: 3.2 mmol/L — ABNORMAL LOW (ref 3.5–5.1)
Sodium: 142 mmol/L (ref 135–145)
Total Bilirubin: 1.7 mg/dL — ABNORMAL HIGH (ref 0.3–1.2)
Total Protein: 7.6 g/dL (ref 6.5–8.1)

## 2019-05-02 LAB — CBC WITH DIFFERENTIAL/PLATELET
Abs Immature Granulocytes: 0.05 10*3/uL (ref 0.00–0.07)
Basophils Absolute: 0.1 10*3/uL (ref 0.0–0.1)
Basophils Relative: 0 %
Eosinophils Absolute: 0 10*3/uL (ref 0.0–0.5)
Eosinophils Relative: 0 %
HCT: 43.1 % (ref 36.0–46.0)
Hemoglobin: 14.1 g/dL (ref 12.0–15.0)
Immature Granulocytes: 0 %
Lymphocytes Relative: 12 %
Lymphs Abs: 1.6 10*3/uL (ref 0.7–4.0)
MCH: 29.5 pg (ref 26.0–34.0)
MCHC: 32.7 g/dL (ref 30.0–36.0)
MCV: 90.2 fL (ref 80.0–100.0)
Monocytes Absolute: 1.3 10*3/uL — ABNORMAL HIGH (ref 0.1–1.0)
Monocytes Relative: 9 %
Neutro Abs: 10.7 10*3/uL — ABNORMAL HIGH (ref 1.7–7.7)
Neutrophils Relative %: 79 %
Platelets: 301 10*3/uL (ref 150–400)
RBC: 4.78 MIL/uL (ref 3.87–5.11)
RDW: 14 % (ref 11.5–15.5)
WBC: 13.7 10*3/uL — ABNORMAL HIGH (ref 4.0–10.5)
nRBC: 0 % (ref 0.0–0.2)

## 2019-05-02 LAB — CREATININE, SERUM
Creatinine, Ser: 0.84 mg/dL (ref 0.44–1.00)
GFR calc Af Amer: >60 mL/min (ref >60)
GFR calc non Af Amer: 59 mL/min — ABNORMAL LOW (ref >60)

## 2019-05-02 LAB — CBC
HCT: 41.1 % (ref 36.0–46.0)
Hemoglobin: 13 g/dL (ref 12.0–15.0)
MCH: 28.7 pg (ref 26.0–34.0)
MCHC: 31.6 g/dL (ref 30.0–36.0)
MCV: 90.7 fL (ref 80.0–100.0)
Platelets: 286 10*3/uL (ref 150–400)
RBC: 4.53 MIL/uL (ref 3.87–5.11)
RDW: 14 % (ref 11.5–15.5)
WBC: 12.1 10*3/uL — ABNORMAL HIGH (ref 4.0–10.5)
nRBC: 0 % (ref 0.0–0.2)

## 2019-05-02 LAB — MAGNESIUM: Magnesium: 2.1 mg/dL (ref 1.7–2.4)

## 2019-05-02 LAB — MRSA PCR SCREENING: MRSA by PCR: NEGATIVE

## 2019-05-02 LAB — CBG MONITORING, ED: Glucose-Capillary: 129 mg/dL — ABNORMAL HIGH (ref 70–99)

## 2019-05-02 MED ORDER — HYDROCODONE-ACETAMINOPHEN 5-325 MG PO TABS: 1.0000 | ORAL_TABLET | Freq: Four times a day (QID) | ORAL | Status: DC | PRN

## 2019-05-02 MED ORDER — PANTOPRAZOLE SODIUM 40 MG PO TBEC
40.0000 mg | DELAYED_RELEASE_TABLET | Freq: Every day | ORAL | Status: DC
  Administered 2019-05-02 – 2019-05-07 (×5): 40 mg via ORAL
  Filled 2019-05-02 (×6): qty 1

## 2019-05-02 MED ORDER — POLYETHYLENE GLYCOL 3350 17 G PO PACK: 17.0000 g | PACK | Freq: Every day | ORAL | Status: DC | PRN

## 2019-05-02 MED ORDER — POTASSIUM CHLORIDE CRYS ER 20 MEQ PO TBCR
40.0000 meq | EXTENDED_RELEASE_TABLET | Freq: Once | ORAL | Status: AC
  Administered 2019-05-02: 40 meq via ORAL
  Filled 2019-05-02: qty 2

## 2019-05-02 MED ORDER — ACETAMINOPHEN 325 MG PO TABS: 650.0000 mg | ORAL_TABLET | Freq: Four times a day (QID) | ORAL | Status: DC | PRN

## 2019-05-02 MED ORDER — HEPARIN SODIUM (PORCINE) 5000 UNIT/ML IJ SOLN
5000.0000 [IU] | Freq: Three times a day (TID) | INTRAMUSCULAR | Status: DC
  Administered 2019-05-02 – 2019-05-07 (×14): 5000 [IU] via SUBCUTANEOUS
  Filled 2019-05-02 (×13): qty 1

## 2019-05-02 MED ORDER — TRAZODONE HCL 50 MG PO TABS
50.0000 mg | ORAL_TABLET | Freq: Every day | ORAL | Status: DC
  Administered 2019-05-02 – 2019-05-06 (×5): 50 mg via ORAL
  Filled 2019-05-02 (×5): qty 1

## 2019-05-02 MED ORDER — MORPHINE SULFATE (PF) 2 MG/ML IV SOLN
0.5000 mg | INTRAVENOUS | Status: DC | PRN
  Administered 2019-05-02 – 2019-05-03 (×2): 0.5 mg via INTRAVENOUS
  Filled 2019-05-02 (×2): qty 1

## 2019-05-02 MED ORDER — HYDROMORPHONE HCL 1 MG/ML IJ SOLN
0.5000 mg | Freq: Once | INTRAMUSCULAR | Status: AC
  Administered 2019-05-02: 0.5 mg via INTRAVENOUS
  Filled 2019-05-02: qty 1

## 2019-05-02 MED ORDER — PRAVASTATIN SODIUM 20 MG PO TABS
10.0000 mg | ORAL_TABLET | Freq: Every day | ORAL | Status: DC
  Administered 2019-05-04 – 2019-05-07 (×4): 10 mg via ORAL
  Filled 2019-05-02 (×4): qty 1

## 2019-05-02 MED ORDER — CEFAZOLIN SODIUM-DEXTROSE 1-4 GM/50ML-% IV SOLN
1.0000 g | Freq: Three times a day (TID) | INTRAVENOUS | Status: DC
  Administered 2019-05-02 – 2019-05-04 (×6): 1 g via INTRAVENOUS
  Filled 2019-05-02 (×7): qty 50

## 2019-05-02 MED ORDER — ORAL CARE MOUTH RINSE
15.0000 mL | Freq: Two times a day (BID) | OROMUCOSAL | Status: DC
  Administered 2019-05-02 – 2019-05-06 (×8): 15 mL via OROMUCOSAL

## 2019-05-02 NOTE — ED Notes (Signed)
Levan Hurst, niece, [place] Gully, will come back if you need her, [phone].

## 2019-05-02 NOTE — ED Provider Notes (Signed)
[facility]-EMERGENCY DEPT Provider Note   CSN: [id number] Arrival date & time: 05/02/19  1425     History Chief Complaint  Patient presents with  . Fall  . Hip Pain    [name] is a 86 y.o. female.  Patient fell at the nursing home.  Patient has dementia.  Patient complains of some right hip pain  The history is provided by the patient and the nursing home. No language interpreter was used.  Fall This is a new problem. The current episode started 12 to 24 hours ago. The problem occurs constantly. The problem has not changed since onset.Pertinent negatives include no chest pain, no abdominal pain and no headaches. Nothing aggravates the symptoms. Nothing relieves the symptoms. She has tried nothing for the symptoms. The treatment provided no relief.       Past Medical History:  Diagnosis Date  . Alzheimer disease (HCC)   . Dementia (HCC)   . Essential hypertension   . Hyperlipidemia     Patient Active Problem List   Diagnosis Date Noted  . Closed right hip fracture (HCC) 05/02/2019  . Subdural hematoma (HCC) 03/21/2019  . Essential hypertension 03/21/2019  . Dementia without behavioral disturbance (HCC) 03/21/2019  . Hyperlipidemia 03/21/2019  . Hypokalemia 03/21/2019    No past surgical history on file.   OB History   No obstetric history on file.     No family history on file.  Social History   Tobacco Use  . Smoking status: Former Smoker    Packs/day: 0.25    Types: Cigarettes  . Smokeless tobacco: Never Used  . Tobacco comment: smoked socially  Substance Use Topics  . Alcohol use: Never  . Drug use: Never    Home Medications Prior to Admission medications   Medication Sig Start Date End Date Taking? Authorizing Provider  acetaminophen (TYLENOL) 325 MG tablet Take 650 mg by mouth every 6 (six) hours as needed for mild pain or headache.   Yes [provider]  guaiFENesin-codeine (ROBITUSSIN AC) 100-10 MG/5ML syrup  Take 5 mLs by mouth every 8 (eight) hours as needed for cough.   Yes [provider]  losartan-hydrochlorothiazide (HYZAAR) 100-25 MG tablet Take 1 tablet by mouth daily. 03/05/19  Yes [provider]  Melatonin 5 MG TABS Take 5 mg by mouth at bedtime as needed (sleep).   Yes [provider]  omeprazole (PRILOSEC) 20 MG capsule Take 20 mg by mouth every other day. 03/05/19  Yes [provider]  pravastatin (PRAVACHOL) 10 MG tablet Take 10 mg by mouth daily.  03/05/19  Yes [provider]  traZODone (DESYREL) 50 MG tablet Take 50 mg by mouth at bedtime. 03/05/19  Yes [provider]    Allergies    Penicillins  Review of Systems   Review of Systems  Unable to perform ROS: Dementia  Constitutional: Negative for appetite change and fatigue.  HENT: Negative for congestion, ear discharge and sinus pressure.   Eyes: Negative for discharge.  Respiratory: Negative for cough.   Cardiovascular: Negative for chest pain.  Gastrointestinal: Negative for abdominal pain and diarrhea.  Genitourinary: Negative for frequency and hematuria.  Musculoskeletal: Negative for back pain.  Skin: Negative for rash.  Neurological: Negative for seizures and headaches.  Psychiatric/Behavioral: Negative for hallucinations.    Physical Exam Updated Vital Signs BP (!) 138/105   Pulse 100   Temp 99.1 F (37.3 C) (Oral)   Resp (!) 26   Ht 5\' 9"  (1.753  m)   SpO2 94%   BMI 22.15 kg/m   Physical Exam Vitals and nursing note reviewed.  Constitutional:      Appearance: She is well-developed.  HENT:     Head: Normocephalic.     Nose: Nose normal.  Eyes:     General: No scleral icterus.    Conjunctiva/sclera: Conjunctivae normal.  Neck:     Thyroid: No thyromegaly.  Cardiovascular:     Rate and Rhythm: Normal rate and regular rhythm.     Heart sounds: No murmur. No friction rub. No gallop.   Pulmonary:     Breath sounds: No stridor. No wheezing or rales.    Chest:     Chest wall: No tenderness.  Abdominal:     General: There is no distension.     Tenderness: There is no abdominal tenderness. There is no rebound.  Musculoskeletal:        General: Normal range of motion.     Cervical back: Neck supple.     Comments: Tender right hip and laceration to right elbow  Lymphadenopathy:     Cervical: No cervical adenopathy.  Skin:    Findings: No erythema or rash.  Neurological:     Mental Status: She is alert and oriented to person, place, and time.     Motor: No abnormal muscle tone.     Coordination: Coordination normal.  Psychiatric:        Behavior: Behavior normal.     ED Results / Procedures / Treatments   Labs (all labs ordered are listed, but only abnormal results are displayed) Labs Reviewed  CBC WITH DIFFERENTIAL/PLATELET - Abnormal; Notable for the following components:      Result Value   WBC 13.7 (*)    Neutro Abs 10.7 (*)    Monocytes Absolute 1.3 (*)    All other components within normal limits  COMPREHENSIVE METABOLIC PANEL - Abnormal; Notable for the following components:   Potassium 3.2 (*)    Glucose, Bld 108 (*)    Total Bilirubin 1.7 (*)    GFR calc non Af Amer 55 (*)    All other components within normal limits  CBG MONITORING, ED - Abnormal; Notable for the following components:   Glucose-Capillary 129 (*)    All other components within normal limits  SARS CORONAVIRUS 2 (TAT 6-24 HRS)  MAGNESIUM    EKG None  Radiology DG Chest 1 View  Result Date: 05/02/2019 CLINICAL DATA:  Fall EXAM: CHEST  1 VIEW COMPARISON:  None. FINDINGS: Low lung volumes with atelectatic changes. Some hazy interstitial changes and indistinctness of the vascularity is noted as well. Cardiac prominence is likely related to portable technique and low volumes. The aorta is calcified. The remaining cardiomediastinal contours are unremarkable. No acute osseous or soft tissue abnormality. Cardiac monitoring leads overlie chest.  IMPRESSION: Low lung volumes with atelectatic changes. Hazy interstitial changes and indistinctness of the vascularity could suggest mild edema well. Aortic Atherosclerosis (ICD10-I70.0). Electronically Signed   By: Lovena Le M.D.   On: 05/02/2019 16:19   DG Elbow Complete Right  Result Date: 05/02/2019 CLINICAL DATA:  Fall EXAM: RIGHT ELBOW - COMPLETE 3+ VIEW COMPARISON:  None. FINDINGS: Nondisplaced intra-articular fracture involving the lateral articular surface of the radial head. No significant joint effusion. No other acute fracture or traumatic malalignment. Mild degenerative changes the elbow. Remaining soft tissues are unremarkable. IMPRESSION: Nondisplaced intra-articular fracture involving the lateral articular surface of the radial head. Electronically Signed   By: March Rummage  [facility] M.D.   On: 05/02/2019 16:15   DG Hip Unilat W or Wo Pelvis 2-3 Views Right  Result Date: 05/02/2019 CLINICAL DATA:  Fall EXAM: DG HIP (WITH OR WITHOUT PELVIS) 2-3V RIGHT COMPARISON:  None. FINDINGS: The osseous structures appear diffusely demineralized which may limit detection of small or nondisplaced fractures. Impacted, externally rotated and slightly varus angulated transcervical fracture of the right femur. Femoral head remains normally located within the acetabulum albeit with some notable a sphericity of the right femoral head which is asymmetric to the contralateral side. The left femur is intact. Remaining bones of the pelvis are intact and congruent with degenerative changes in the lower lumbar spine SI joints and symphysis pubis. These a pathic changes noted upon the iliac crests and greater trochanters bilaterally. Vascular calcium noted in the medial thighs. IMPRESSION: 1. Impacted, externally rotated and slightly varus angulated transcervical fracture of the right femur. 2. Diffuse bony demineralization. 3. Degenerative changes in the lower lumbar spine, SI joints, and symphysis pubis. Electronically Signed    By: Kreg Shropshire M.D.   On: 05/02/2019 16:14    Procedures Procedures (including critical care time)  Medications Ordered in ED Medications  potassium chloride SA (KLOR-CON) CR tablet 40 mEq (has no administration in time range)  HYDROmorphone (DILAUDID) injection 0.5 mg (0.5 mg Intravenous Given 05/02/19 1842)    ED Course  I have reviewed the triage vital signs and the nursing notes.  Pertinent labs & imaging results that were available during my care of the patient were reviewed by me and considered in my medical decision making (see chart for details).    MDM Rules/Calculators/A&P                      Patient with a fracture to her right hip along with nondisplaced fracture to the radial head and right elbow.  I spoke with Dr. Magnus Ivan orthopedics and he will see the patient.  Medicine will admit Final Clinical Impression(s) / ED Diagnoses Final diagnoses:  Fall    Rx / DC Orders ED Discharge Orders    None       Bethann Berkshire, MD 05/02/19 1845

## 2019-05-02 NOTE — ED Notes (Addendum)
Right leg noted to be outwardly rotated. Cool to touch. Unable to palpate pulse. Able to locate pulse using doppler. EDP made aware. FSBS=129.

## 2019-05-02 NOTE — Progress Notes (Signed)
Pharmacy Antibiotic Note  [name] is a 86 y.o. female admitted on 05/02/2019 s/p fall, with laceration to R forearm.  Pharmacy has been consulted for cefazolin dosing.  Plan:  Ancef 1g IV q8 hr  Height: 5\' 9"  (175.3 cm) IBW/kg (Calculated) : 66.2  Temp (24hrs), Avg:99.1 F (37.3 C), Min:99.1 F (37.3 C), Max:99.1 F (37.3 C)  Recent Labs  Lab 05/02/19 1620  WBC 13.7*  CREATININE 0.89    CrCl cannot be calculated (Unknown ideal weight.).    Allergies  Allergen Reactions  . Penicillins Other (See Comments)    Unknown Tolerates Keflex    Thank you for allowing pharmacy to be a part of this patient's care.  Lynn Recendiz A 05/02/2019 7:39 PM

## 2019-05-02 NOTE — Progress Notes (Signed)
Patient ID: [name], female   DOB: 07/30/1922, 85 y.o.   MRN: [id number] I have reviewed the patient's right hip x-rays and will see her later today.  She does have a right hip femoral neck fracture.  I will need to talk with her healthcare POA.  If surgery is indicated, will plan on surgery for tomorrow.  She can have a regular diet today.  Will need a Covid-19 test due to the potential for surgery.

## 2019-05-02 NOTE — ED Notes (Signed)
Pt noted to have partial thickness avulsion to right elbow. Cleaned and dressing applied. Bleeding controlled. Dr. Estell Harpin made aware.

## 2019-05-02 NOTE — ED Notes (Signed)
Transported to radiology via stretcher

## 2019-05-02 NOTE — Progress Notes (Addendum)
Pt admitted from ED. MD at bedside during transfer. Instructed to dress skin tear on R elbow with xeroform, kerlix, and ace wrap. Orders placed for informed consent for surgery. Per MD note, niece to be called for informed consent. Pt's niece, Levan Hurst, called and verbal consent given for surgery. Consent verified with another RN and placed in chart. Pt will be NPO after midnight per orders. Pt not complaining of any pain at this moment.

## 2019-05-02 NOTE — ED Provider Notes (Signed)
Patient's niece is the healthcare power of attorney.  Her name is [name] phone number [phone]   Bethann Berkshire, MD 05/02/19 1659

## 2019-05-02 NOTE — Plan of Care (Signed)
  Problem: Clinical Measurements: Goal: Ability to maintain clinical measurements within normal limits will improve Outcome: Progressing Goal: Will remain free from infection Outcome: Progressing Goal: Respiratory complications will improve Outcome: Progressing Goal: Cardiovascular complication will be avoided Outcome: Progressing   Problem: Elimination: Goal: Will not experience complications related to urinary retention Outcome: Progressing   Problem: Pain Managment: Goal: General experience of comfort will improve Outcome: Progressing   Problem: Safety: Goal: Ability to remain free from injury will improve Outcome: Progressing   Problem: Skin Integrity: Goal: Risk for impaired skin integrity will decrease Outcome: Progressing

## 2019-05-02 NOTE — Consult Note (Signed)
Reason for Consult: Right hip fracture Referring Physician: Dr. Amil Amen is an 86 y.o. female.  HPI: The patient is a 86 year old female with dementia who does stay at a nursing care facility.  She is alert but not oriented.  She does have advanced dementia.  At the nursing facility she does walk.  I have spoken with her niece who works in the [employer] system.  She is healthcare power of attorney.  She states that her aunt has been doing well for many years but then over the last few months has had multiple falls.  The patient did have a fall today and after complaining of hip pain and difficulty ambulating she was brought to the [facility] emergency room.  She was found to have a nondisplaced right hip femoral neck fracture.  I believe there is also a right radial head fracture.  She does have a skin abrasion which is a deep laceration over the right elbow.  Orthopedic surgery is consulted for further ration treatment of the right hip and right elbow.  I was able to see and assess the patient at the bedside as she was graciously admitted by [facility] to an inpatient unit.  Past Medical History:  Diagnosis Date  . Alzheimer disease (HCC)   . Dementia (HCC)   . Essential hypertension   . Hyperlipidemia     No past surgical history on file.  No family history on file.  Social History:  reports that she has quit smoking. Her smoking use included cigarettes. She smoked 0.25 packs per day. She has never used smokeless tobacco. She reports that she does not drink alcohol or use drugs.  Allergies:  Allergies  Allergen Reactions  . Penicillins Other (See Comments)    Unknown Tolerates Keflex    Medications: I have reviewed the patient's current medications.  Results for orders placed or performed during the hospital encounter of 05/02/19 (from the past 48 hour(s))  CBG monitoring, ED     Status: Abnormal   Collection Time: 05/02/19  2:44 PM  Result Value Ref Range    Glucose-Capillary 129 (H) 70 - 99 mg/dL    Comment: Glucose reference range applies only to samples taken after fasting for at least 8 hours.  CBC with Differential/Platelet     Status: Abnormal   Collection Time: 05/02/19  4:20 PM  Result Value Ref Range   WBC 13.7 (H) 4.0 - 10.5 K/uL   RBC 4.78 3.87 - 5.11 MIL/uL   Hemoglobin 14.1 12.0 - 15.0 g/dL   HCT 02.6 37.8 - 58.8 %   MCV 90.2 80.0 - 100.0 fL   MCH 29.5 26.0 - 34.0 pg   MCHC 32.7 30.0 - 36.0 g/dL   RDW 50.2 77.4 - 12.8 %   Platelets 301 150 - 400 K/uL   nRBC 0.0 0.0 - 0.2 %   Neutrophils Relative % 79 %   Neutro Abs 10.7 (H) 1.7 - 7.7 K/uL   Lymphocytes Relative 12 %   Lymphs Abs 1.6 0.7 - 4.0 K/uL   Monocytes Relative 9 %   Monocytes Absolute 1.3 (H) 0.1 - 1.0 K/uL   Eosinophils Relative 0 %   Eosinophils Absolute 0.0 0.0 - 0.5 K/uL   Basophils Relative 0 %   Basophils Absolute 0.1 0.0 - 0.1 K/uL   Immature Granulocytes 0 %   Abs Immature Granulocytes 0.05 0.00 - 0.07 K/uL    Comment: Performed at [facility], [address]  [place], [place] [address]  Comprehensive metabolic panel     Status: Abnormal   Collection Time: 05/02/19  4:20 PM  Result Value Ref Range   Sodium 142 135 - 145 mmol/L   Potassium 3.2 (L) 3.5 - 5.1 mmol/L   Chloride 104 98 - 111 mmol/L   CO2 26 22 - 32 mmol/L   Glucose, Bld 108 (H) 70 - 99 mg/dL    Comment: Glucose reference range applies only to samples taken after fasting for at least 8 hours.   BUN 19 8 - 23 mg/dL   Creatinine, Ser 8.29 0.44 - 1.00 mg/dL   Calcium 9.4 8.9 - 93.7 mg/dL   Total Protein 7.6 6.5 - 8.1 g/dL   Albumin 4.3 3.5 - 5.0 g/dL   AST 24 15 - 41 U/L   ALT 18 0 - 44 U/L   Alkaline Phosphatase 76 38 - 126 U/L   Total Bilirubin 1.7 (H) 0.3 - 1.2 mg/dL   GFR calc non Af Amer 55 (L) >60 mL/min   GFR calc Af Amer >60 >60 mL/min   Anion gap 12 5 - 15    Comment: Performed at [facility], [address], [place], [place] [address]     DG Chest 1 View  Result Date: 05/02/2019 CLINICAL DATA:  Fall EXAM: CHEST  1 VIEW COMPARISON:  None. FINDINGS: Low lung volumes with atelectatic changes. Some hazy interstitial changes and indistinctness of the vascularity is noted as well. Cardiac prominence is likely related to portable technique and low volumes. The aorta is calcified. The remaining cardiomediastinal contours are unremarkable. No acute osseous or soft tissue abnormality. Cardiac monitoring leads overlie chest. IMPRESSION: Low lung volumes with atelectatic changes. Hazy interstitial changes and indistinctness of the vascularity could suggest mild edema well. Aortic Atherosclerosis (ICD10-I70.0). Electronically Signed   By: Kreg Shropshire M.D.   On: 05/02/2019 16:19   DG Elbow Complete Right  Result Date: 05/02/2019 CLINICAL DATA:  Fall EXAM: RIGHT ELBOW - COMPLETE 3+ VIEW COMPARISON:  None. FINDINGS: Nondisplaced intra-articular fracture involving the lateral articular surface of the radial head. No significant joint effusion. No other acute fracture or traumatic malalignment. Mild degenerative changes the elbow. Remaining soft tissues are unremarkable. IMPRESSION: Nondisplaced intra-articular fracture involving the lateral articular surface of the radial head. Electronically Signed   By: Kreg Shropshire M.D.   On: 05/02/2019 16:15   CT Head Wo Contrast  Result Date: 05/02/2019 CLINICAL DATA:  Fall EXAM: CT HEAD WITHOUT CONTRAST CT CERVICAL SPINE WITHOUT CONTRAST TECHNIQUE: Multidetector CT imaging of the head and cervical spine was performed following the standard protocol without intravenous contrast. Multiplanar CT image reconstructions of the cervical spine were also generated. COMPARISON:  CT head 03/22/2019, CT head cervical spine and maxillofacial 03/21/2019 FINDINGS: CT HEAD FINDINGS Brain: Interval resolution of a previously seen subdural hematomas along the left frontal convexity and falx seen on comparison studies. Stable  partially calcified 8 mm sub frontal mass, compatible with meningioma. No associated edema or significant mass effect. No evidence of acute infarction, hemorrhage, hydrocephalus, new extra-axial collection or mass lesion/mass effect. Symmetric prominence of the ventricles, cisterns and sulci compatible with frontal predominant parenchymal volume loss. Patchy and con areas of white matter hypoattenuation are most compatible with moderate chronic microvascular angiopathy. Vascular: Atherosclerotic calcification of the carotid siphons. No hyperdense vessel. Skull: No calvarial fracture or suspicious osseous lesion. No scalp swelling or hematoma. Sinuses/Orbits: Paranasal sinuses and mastoid air cells are predominantly clear. Included orbital structures are  unremarkable. Other: None CT CERVICAL SPINE FINDINGS Alignment: Straightening of the normal cervical lordosis without traumatic listhesis. Appearance is similar to prior. No abnormally widened, jumped or perched facets. Normal alignment of the craniocervical and atlantoaxial articulations accounting for rightward head rotation. Skull base and vertebrae: Streak artifact from dental amalgam and patient's metallic necklace may limit detection of subtle nondisplaced fractures. No acute fracture. No primary bone lesion or focal pathologic process. Soft tissues and spinal canal: No pre or paravertebral fluid or swelling. No visible canal hematoma. Disc levels: Multilevel intervertebral disc height loss with spondylitic endplate changes, facet hypertrophy and uncinate spurring. Disc osteophyte complexes are most pronounced at C5-6 and C6-7 resulting in mild canal stenosis. No severe canal stenosis or foraminal narrowing. Upper chest: No acute abnormality in the upper chest or imaged lung apices. Other: Atherosclerotic calcification of the thoracic aortic arch and left common carotid. Thyroid is largely obscured by streak artifact. IMPRESSION: 1. No acute intracranial  abnormality. No scalp swelling or calvarial fracture. 2. Interval resolution of previously seen left frontal convexity and parafalcine subdural hematomas. 3. Chronic frontal predominant parenchymal volume loss and moderate microvascular angiopathy. 4. No acute cervical spine fracture or traumatic listhesis. 5. Multilevel cervical spondylitic changes, as detailed above. 6. Aortic Atherosclerosis (ICD10-I70.0). Electronically Signed   By: Kreg Shropshire M.D.   On: 05/02/2019 18:12   CT Cervical Spine Wo Contrast  Result Date: 05/02/2019 CLINICAL DATA:  Fall EXAM: CT HEAD WITHOUT CONTRAST CT CERVICAL SPINE WITHOUT CONTRAST TECHNIQUE: Multidetector CT imaging of the head and cervical spine was performed following the standard protocol without intravenous contrast. Multiplanar CT image reconstructions of the cervical spine were also generated. COMPARISON:  CT head 03/22/2019, CT head cervical spine and maxillofacial 03/21/2019 FINDINGS: CT HEAD FINDINGS Brain: Interval resolution of a previously seen subdural hematomas along the left frontal convexity and falx seen on comparison studies. Stable partially calcified 8 mm sub frontal mass, compatible with meningioma. No associated edema or significant mass effect. No evidence of acute infarction, hemorrhage, hydrocephalus, new extra-axial collection or mass lesion/mass effect. Symmetric prominence of the ventricles, cisterns and sulci compatible with frontal predominant parenchymal volume loss. Patchy and con areas of white matter hypoattenuation are most compatible with moderate chronic microvascular angiopathy. Vascular: Atherosclerotic calcification of the carotid siphons. No hyperdense vessel. Skull: No calvarial fracture or suspicious osseous lesion. No scalp swelling or hematoma. Sinuses/Orbits: Paranasal sinuses and mastoid air cells are predominantly clear. Included orbital structures are unremarkable. Other: None CT CERVICAL SPINE FINDINGS Alignment: Straightening  of the normal cervical lordosis without traumatic listhesis. Appearance is similar to prior. No abnormally widened, jumped or perched facets. Normal alignment of the craniocervical and atlantoaxial articulations accounting for rightward head rotation. Skull base and vertebrae: Streak artifact from dental amalgam and patient's metallic necklace may limit detection of subtle nondisplaced fractures. No acute fracture. No primary bone lesion or focal pathologic process. Soft tissues and spinal canal: No pre or paravertebral fluid or swelling. No visible canal hematoma. Disc levels: Multilevel intervertebral disc height loss with spondylitic endplate changes, facet hypertrophy and uncinate spurring. Disc osteophyte complexes are most pronounced at C5-6 and C6-7 resulting in mild canal stenosis. No severe canal stenosis or foraminal narrowing. Upper chest: No acute abnormality in the upper chest or imaged lung apices. Other: Atherosclerotic calcification of the thoracic aortic arch and left common carotid. Thyroid is largely obscured by streak artifact. IMPRESSION: 1. No acute intracranial abnormality. No scalp swelling or calvarial fracture. 2. Interval resolution of previously seen  left frontal convexity and parafalcine subdural hematomas. 3. Chronic frontal predominant parenchymal volume loss and moderate microvascular angiopathy. 4. No acute cervical spine fracture or traumatic listhesis. 5. Multilevel cervical spondylitic changes, as detailed above. 6. Aortic Atherosclerosis (ICD10-I70.0). Electronically Signed   By: Lovena Le M.D.   On: 05/02/2019 18:12   DG Hip Unilat W or Wo Pelvis 2-3 Views Right  Result Date: 05/02/2019 CLINICAL DATA:  Fall EXAM: DG HIP (WITH OR WITHOUT PELVIS) 2-3V RIGHT COMPARISON:  None. FINDINGS: The osseous structures appear diffusely demineralized which may limit detection of small or nondisplaced fractures. Impacted, externally rotated and slightly varus angulated transcervical  fracture of the right femur. Femoral head remains normally located within the acetabulum albeit with some notable a sphericity of the right femoral head which is asymmetric to the contralateral side. The left femur is intact. Remaining bones of the pelvis are intact and congruent with degenerative changes in the lower lumbar spine SI joints and symphysis pubis. These a pathic changes noted upon the iliac crests and greater trochanters bilaterally. Vascular calcium noted in the medial thighs. IMPRESSION: 1. Impacted, externally rotated and slightly varus angulated transcervical fracture of the right femur. 2. Diffuse bony demineralization. 3. Degenerative changes in the lower lumbar spine, SI joints, and symphysis pubis. Electronically Signed   By: Lovena Le M.D.   On: 05/02/2019 16:14    Review of Systems Blood pressure 131/63, pulse 80, temperature 99.5 F (37.5 C), temperature source Oral, resp. rate (!) 22, height 5\' 7"  (1.702 m), weight 55.2 kg, SpO2 94 %. Physical Exam  Constitutional: She appears well-developed.  HENT:  Head: Normocephalic.  Eyes: Pupils are equal, round, and reactive to light.  Cardiovascular: Normal rate.  Respiratory: Effort normal.  GI: Soft.  Musculoskeletal:     Cervical back: Normal range of motion.     Right hip: Tenderness and bony tenderness present. Decreased range of motion. Decreased strength.  Neurological: She is alert.  Skin: Skin is warm and dry.  Psychiatric: She has a normal mood and affect.   With the patient laying supine her leg lengths are equal.  I can flex her hip and she does respond with some pain in the hip.  I did independently review the x-rays of the right hip and the right elbow.  The femoral neck fracture is impacted with no significant angulation and no shortening.  The displacement is minimal.  There is a small nondisplaced radial head fracture on the right elbow.  Assessment/Plan: Right hip with minimally displaced femoral neck  fracture and a right nondisplaced radial head fracture  I have talked to the niece in detail.  I went over recommendations for operative versus nonoperative treatment.  Given the fact that the patient does respond to pain stimulus and is quite alert, we are recommending an operative intervention.  After review the x-rays and examined the patient we will attempt a least invasive surgery with cannulated screw fixation.  Our plan will be to proceed to the operating room tomorrow morning for screw placement to the right hip.  We can certainly clean the elbow wound in the same setting.  All questions and concerns were answered and addressed with her niece.  She will be called for informed consent.  Mcarthur Rossetti 05/02/2019, 7:56 PM

## 2019-05-02 NOTE — ED Triage Notes (Signed)
86 yo female from [facility]. Pt arrives to ED after 2nd fall today. Most recently, pt was walking to [place] and tripped per EMS. C/O right hip pain. Lac to right forearm. Denies head injury. No LOC. Denies neck/back pain. This morning pt possibly rolled out of bed per EMS. No injury at that time.

## 2019-05-02 NOTE — H&P (Signed)
History and Physical        Hospital Admission Note Date: 05/02/2019  Patient name: [name] Medical record number: [id number] Date of birth: [birth year] Age: 86 y.o. Gender: female  PCP: Wonda Cheng, MD  Patient coming from: [facility]  Chief Complaint    Fall   HPI:   This is a 86 year old female with a history of advanced dementia from memory care unit, hypertension, hyperlipidemia, subdural hematoma, GERD, multiple falls in the past several months recently completed PT who presented to [facility] ED status post fall with right hip pain after she was noted to be walking to the bathroom and tripped per EMS.  Also noted to have laceration to right forearm.  Denied head injury or LOC on admission.  Patient is a very poor historian and much of history is obtained from prior notes as well as niece over the phone who is an Scientist, physiological of the [facility] internal medicine residency.  In ED found to have right femoral fracture and right nondisplaced intra-articular fracture.  Orthopedic surgery was contacted by ED physician  Currently, patient was trying to get out of bed and states she wanted to go home and back to her room.  Admitted to some right hip pain and right elbow pain but otherwise denied complaints.  Per daughter, patient is a DNR and has had many discussions at length in the past.  Notable ED labs: K3.2, glucose 108, T bili 1.7, WBC 13.7, COVID 19 pending ED treatments: Dilaudid 0.5 mg x 1, K-Dur 40 mEq x 1 ED imaging:  Right hip x-ray: Impacted, externally rotated and slightly varus angulated transcervical fracture of right femur, diffuse bony demineralization, degenerative changes of lumbar spine and pelvis;  Right elbow x-ray: Nondisplaced intra-articular fracture involving the lateral articular surface of radial head CXR: Low lung volumes with atelectasis  ED vitals:    Vitals:   05/02/19 1700 05/02/19 1806  BP: 134/77 129/65  Pulse: (!) 109 100  Resp: (!) 21 17  Temp:    SpO2: 92% (!) 89%     Review of Systems:  Review of Systems  Unable to perform ROS: Dementia    Medical/Social/Family History   Past Medical History: Past Medical History:  Diagnosis Date  . Alzheimer disease ([place])   . Dementia ([place])   . Essential hypertension   . Hyperlipidemia     No past surgical history on file.  Medications: Prior to Admission medications   Medication Sig Start Date End Date Taking? Authorizing Provider  acetaminophen (TYLENOL) 325 MG tablet Take 650 mg by mouth every 6 (six) hours as needed for mild pain or headache.   Yes [provider]  guaiFENesin-codeine (ROBITUSSIN AC) 100-10 MG/5ML syrup Take 5 mLs by mouth every 8 (eight) hours as needed for cough.   Yes [provider]  losartan-hydrochlorothiazide (HYZAAR) 100-25 MG tablet Take 1 tablet by mouth daily. 03/05/19  Yes [provider]  Melatonin 5 MG TABS Take 5 mg by mouth at bedtime as needed (sleep).   Yes [provider]  omeprazole (PRILOSEC) 20 MG capsule Take 20 mg by mouth every other day. 03/05/19  Yes [provider]  pravastatin (PRAVACHOL) 10 MG tablet  Take 10 mg by mouth daily.  03/05/19  Yes [provider]  traZODone (DESYREL) 50 MG tablet Take 50 mg by mouth at bedtime. 03/05/19  Yes [provider]    Allergies:   Allergies  Allergen Reactions  . Penicillins Other (See Comments)    unknown    Social History:  reports that she has quit smoking. Her smoking use included cigarettes. She smoked 0.25 packs per day. She has never used smokeless tobacco. She reports that she does not drink alcohol or use drugs.  Family History: No family history on file.   Objective   Physical Exam: Blood pressure 129/65, pulse 100, temperature 99.1 F (37.3 C), temperature source Oral, resp. rate 17, height 5\' 9"  (1.753 m),  SpO2 (!) 89 %.  Physical Exam Vitals and nursing note reviewed.  Constitutional:      Appearance: She is not ill-appearing.     Comments: Pleasantly confused trying to get out of bed  HENT:     Mouth/Throat:     Comments: Mask Eyes:     Conjunctiva/sclera: Conjunctivae normal.  Cardiovascular:     Rate and Rhythm: Regular rhythm. Tachycardia present.     Pulses: Normal pulses.     Heart sounds: No murmur.  Pulmonary:     Effort: Pulmonary effort is normal.     Breath sounds: Normal breath sounds.  Abdominal:     General: Abdomen is flat.     Palpations: Abdomen is soft.  Musculoskeletal:     Comments: Patient tried to move right hip and had severe pain Hip was flexed Right elbow laceration   Neurological:     Mental Status: She is alert. She is disoriented.     Comments: Awake and alert, oriented x0     LABS on Admission: I have personally reviewed all the labs and imaging below    Basic Metabolic Panel: Recent Labs  Lab 05/02/19 1620  NA 142  K 3.2*  CL 104  CO2 26  GLUCOSE 108*  BUN 19  CREATININE 0.89  CALCIUM 9.4   Liver Function Tests: Recent Labs  Lab 05/02/19 1620  AST 24  ALT 18  ALKPHOS 76  BILITOT 1.7*  PROT 7.6  ALBUMIN 4.3   No results for input(s): LIPASE, AMYLASE in the last 168 hours. No results for input(s): AMMONIA in the last 168 hours. CBC: Recent Labs  Lab 05/02/19 1620  WBC 13.7*  NEUTROABS 10.7*  HGB 14.1  HCT 43.1  MCV 90.2  PLT 301   Cardiac Enzymes: No results for input(s): CKTOTAL, CKMB, CKMBINDEX, TROPONINI in the last 168 hours. BNP: Invalid input(s): POCBNP CBG: Recent Labs  Lab 05/02/19 1444  GLUCAP 129*    Radiological Exams on Admission:  DG Chest 1 View  Result Date: 05/02/2019 CLINICAL DATA:  Fall EXAM: CHEST  1 VIEW COMPARISON:  None. FINDINGS: Low lung volumes with atelectatic changes. Some hazy interstitial changes and indistinctness of the vascularity is noted as well. Cardiac prominence is  likely related to portable technique and low volumes. The aorta is calcified. The remaining cardiomediastinal contours are unremarkable. No acute osseous or soft tissue abnormality. Cardiac monitoring leads overlie chest. IMPRESSION: Low lung volumes with atelectatic changes. Hazy interstitial changes and indistinctness of the vascularity could suggest mild edema well. Aortic Atherosclerosis (ICD10-I70.0). Electronically Signed   By: 07/02/2019 M.D.   On: 05/02/2019 16:19   DG Elbow Complete Right  Result Date: 05/02/2019 CLINICAL DATA:  Fall EXAM: RIGHT ELBOW - COMPLETE  3+ VIEW COMPARISON:  None. FINDINGS: Nondisplaced intra-articular fracture involving the lateral articular surface of the radial head. No significant joint effusion. No other acute fracture or traumatic malalignment. Mild degenerative changes the elbow. Remaining soft tissues are unremarkable. IMPRESSION: Nondisplaced intra-articular fracture involving the lateral articular surface of the radial head. Electronically Signed   By: Kreg Shropshire M.D.   On: 05/02/2019 16:15   DG Hip Unilat W or Wo Pelvis 2-3 Views Right  Result Date: 05/02/2019 CLINICAL DATA:  Fall EXAM: DG HIP (WITH OR WITHOUT PELVIS) 2-3V RIGHT COMPARISON:  None. FINDINGS: The osseous structures appear diffusely demineralized which may limit detection of small or nondisplaced fractures. Impacted, externally rotated and slightly varus angulated transcervical fracture of the right femur. Femoral head remains normally located within the acetabulum albeit with some notable a sphericity of the right femoral head which is asymmetric to the contralateral side. The left femur is intact. Remaining bones of the pelvis are intact and congruent with degenerative changes in the lower lumbar spine SI joints and symphysis pubis. These a pathic changes noted upon the iliac crests and greater trochanters bilaterally. Vascular calcium noted in the medial thighs. IMPRESSION: 1. Impacted,  externally rotated and slightly varus angulated transcervical fracture of the right femur. 2. Diffuse bony demineralization. 3. Degenerative changes in the lower lumbar spine, SI joints, and symphysis pubis. Electronically Signed   By: Kreg Shropshire M.D.   On: 05/02/2019 16:14      EKG: Independently reviewed.  Low voltage, sinus tachycardia   A & P   Active Problems:   Closed right hip fracture (HCC)   1. Status post mechanical fall with resultant right hip fracture and right elbow fracture/laceration a. Orthopedic surgery consulted b. Multiple falls in the past few months - palliative care consult c. SLP eval prior to advancing diet today, n.p.o. after midnight for possible procedure d. Will empirically cover with ancef for right elbow laceration e. Pain management per Ortho order set f. PT eval 2. Hypertension a. Will hold home losartan-HCTZ due to hypokalemia and this may be causing her multiple falls 3. GERD a. Protonix PO 4. Hypokalemia a. Replete PO b. Check Mg 5. Leukocytosis, likely reactive to fractures a. Will check UA 6. Advanced Dementia a. Continue home trazodone nightly b. Delirium precautions   DVT prophylaxis: SCD, heparin   Code Status: Prior  Diet: NPO pending SLP eval, NPO after midnight Family Communication: Admission, patients condition and plan of care including tests being ordered have been discussed with the patient who indicates understanding and agrees with the plan and Code Status. Patient's Niece was updated  Disposition Plan: The appropriate patient status for this patient is INPATIENT. Inpatient status is judged to be reasonable and necessary in order to provide the required intensity of service to ensure the patient's safety. The patient's presenting symptoms, physical exam findings, and initial radiographic and laboratory data in the context of their chronic comorbidities is felt to place them at high risk for further clinical deterioration.  Furthermore, it is not anticipated that the patient will be medically stable for discharge from the hospital within 2 midnights of admission. The following factors support the patient status of inpatient.   " The patient's presenting symptoms include right hip pain, fall. " The worrisome physical exam findings include right hip pain with . " The initial radiographic and laboratory data are worrisome because of right hip fracture, right elbow fracture. " The chronic co-morbidities include hypertension, dementia, falls.   * I  certify that at the point of admission it is my clinical judgment that the patient will require inpatient hospital care spanning beyond 2 midnights from the point of admission due to high intensity of service, high risk for further deterioration and high frequency of surveillance required.*    The medical decision making on this patient was of high complexity and the patient is at high risk for clinical deterioration, therefore this is a level 3  admission.  Consultants  . Ortho  Procedures  . none  Time Spent on Admission: 76 minutes    Jae Dire, DO [facility] Pager [phone] 05/02/2019, 6:26 PM

## 2019-05-03 ENCOUNTER — Encounter (HOSPITAL_COMMUNITY): Payer: Self-pay | Admitting: Internal Medicine

## 2019-05-03 ENCOUNTER — Encounter (HOSPITAL_COMMUNITY)
Admission: EM | Disposition: A | Discharge status: Skilled Nursing Facility | Payer: Self-pay | Source: Skilled Nursing Facility | Attending: Internal Medicine

## 2019-05-03 ENCOUNTER — Inpatient Hospital Stay (HOSPITAL_COMMUNITY): Payer: Medicare Other

## 2019-05-03 ENCOUNTER — Inpatient Hospital Stay (HOSPITAL_COMMUNITY): Payer: Medicare Other | Admitting: Anesthesiology

## 2019-05-03 DIAGNOSIS — F0390 Unspecified dementia without behavioral disturbance: Secondary | ICD-10-CM

## 2019-05-03 DIAGNOSIS — E559 Vitamin D deficiency, unspecified: Secondary | ICD-10-CM

## 2019-05-03 DIAGNOSIS — S72001D Fracture of unspecified part of neck of right femur, subsequent encounter for closed fracture with routine healing: Secondary | ICD-10-CM

## 2019-05-03 DIAGNOSIS — Z515 Encounter for palliative care: Secondary | ICD-10-CM

## 2019-05-03 DIAGNOSIS — Z7189 Other specified counseling: Secondary | ICD-10-CM

## 2019-05-03 DIAGNOSIS — R531 Weakness: Secondary | ICD-10-CM

## 2019-05-03 DIAGNOSIS — S72001A Fracture of unspecified part of neck of right femur, initial encounter for closed fracture: Secondary | ICD-10-CM

## 2019-05-03 HISTORY — PX: HIP PINNING,CANNULATED: SHX1758

## 2019-05-03 LAB — URINALYSIS, ROUTINE W REFLEX MICROSCOPIC
Bacteria, UA: NONE SEEN
Bilirubin Urine: NEGATIVE
Glucose, UA: NEGATIVE mg/dL
Hgb urine dipstick: NEGATIVE
Ketones, ur: NEGATIVE mg/dL
Nitrite: NEGATIVE
Protein, ur: NEGATIVE mg/dL
Specific Gravity, Urine: 1.015 (ref 1.005–1.030)
pH: 6 (ref 5.0–8.0)

## 2019-05-03 LAB — BASIC METABOLIC PANEL WITH GFR
Anion gap: 8 (ref 5–15)
BUN: 16 mg/dL (ref 8–23)
CO2: 25 mmol/L (ref 22–32)
Calcium: 8.8 mg/dL — ABNORMAL LOW (ref 8.9–10.3)
Chloride: 106 mmol/L (ref 98–111)
Creatinine, Ser: 0.76 mg/dL (ref 0.44–1.00)
GFR calc Af Amer: >60 mL/min (ref >60)
GFR calc non Af Amer: >60 mL/min (ref >60)
Glucose, Bld: 105 mg/dL — ABNORMAL HIGH (ref 70–99)
Potassium: 3.5 mmol/L (ref 3.5–5.1)
Sodium: 139 mmol/L (ref 135–145)

## 2019-05-03 LAB — SARS CORONAVIRUS 2 (TAT 6-24 HRS): SARS Coronavirus 2: NEGATIVE

## 2019-05-03 LAB — CBC
HCT: 39.2 % (ref 36.0–46.0)
Hemoglobin: 12.5 g/dL (ref 12.0–15.0)
MCH: 29.1 pg (ref 26.0–34.0)
MCHC: 31.9 g/dL (ref 30.0–36.0)
MCV: 91.4 fL (ref 80.0–100.0)
Platelets: 254 K/uL (ref 150–400)
RBC: 4.29 MIL/uL (ref 3.87–5.11)
RDW: 14.2 % (ref 11.5–15.5)
WBC: 11.2 K/uL — ABNORMAL HIGH (ref 4.0–10.5)
nRBC: 0 % (ref 0.0–0.2)

## 2019-05-03 LAB — VITAMIN D 25 HYDROXY (VIT D DEFICIENCY, FRACTURES): Vit D, 25-Hydroxy: 18.33 ng/mL — ABNORMAL LOW (ref 30–100)

## 2019-05-03 SURGERY — FIXATION, FEMUR, NECK, PERCUTANEOUS, USING SCREW
Anesthesia: General | Site: Hip | Laterality: Right

## 2019-05-03 MED ORDER — METHOCARBAMOL 500 MG IVPB - SIMPLE MED
INTRAVENOUS | Status: AC
  Filled 2019-05-03: qty 50

## 2019-05-03 MED ORDER — MUPIROCIN 2 % EX OINT
1.0000 "application " | TOPICAL_OINTMENT | Freq: Two times a day (BID) | CUTANEOUS | Status: DC
  Administered 2019-05-03 – 2019-05-06 (×5): 1 via NASAL
  Filled 2019-05-03 (×2): qty 22

## 2019-05-03 MED ORDER — METHOCARBAMOL 500 MG PO TABS
500.0000 mg | ORAL_TABLET | Freq: Four times a day (QID) | ORAL | Status: DC | PRN
  Administered 2019-05-03 – 2019-05-05 (×2): 500 mg via ORAL
  Filled 2019-05-03 (×2): qty 1

## 2019-05-03 MED ORDER — HYDROMORPHONE HCL 1 MG/ML IJ SOLN
INTRAMUSCULAR | Status: AC
  Filled 2019-05-03: qty 1

## 2019-05-03 MED ORDER — ROCURONIUM BROMIDE 10 MG/ML (PF) SYRINGE
PREFILLED_SYRINGE | INTRAVENOUS | Status: AC
  Filled 2019-05-03: qty 10

## 2019-05-03 MED ORDER — SUCCINYLCHOLINE CHLORIDE 20 MG/ML IJ SOLN
INTRAMUSCULAR | Status: DC | PRN
  Administered 2019-05-03: 140 mg via INTRAVENOUS

## 2019-05-03 MED ORDER — LIDOCAINE HCL (CARDIAC) PF 100 MG/5ML IV SOSY
PREFILLED_SYRINGE | INTRAVENOUS | Status: DC | PRN
  Administered 2019-05-03: 50 mg via INTRAVENOUS

## 2019-05-03 MED ORDER — FENTANYL CITRATE (PF) 100 MCG/2ML IJ SOLN
INTRAMUSCULAR | Status: DC | PRN
  Administered 2019-05-03: 75 ug via INTRAVENOUS
  Administered 2019-05-03: 25 ug via INTRAVENOUS

## 2019-05-03 MED ORDER — CEFAZOLIN SODIUM-DEXTROSE 2-4 GM/100ML-% IV SOLN: 2.0000 g | Freq: Four times a day (QID) | INTRAVENOUS | Status: DC

## 2019-05-03 MED ORDER — CHLORHEXIDINE GLUCONATE CLOTH 2 % EX PADS
6.0000 | MEDICATED_PAD | Freq: Every day | CUTANEOUS | Status: DC
  Administered 2019-05-03: 6 via TOPICAL

## 2019-05-03 MED ORDER — EPHEDRINE SULFATE 50 MG/ML IJ SOLN
INTRAMUSCULAR | Status: DC | PRN
  Administered 2019-05-03: 10 mg via INTRAVENOUS

## 2019-05-03 MED ORDER — METOCLOPRAMIDE HCL 5 MG/ML IJ SOLN: 5.0000 mg | Freq: Three times a day (TID) | INTRAMUSCULAR | Status: DC | PRN

## 2019-05-03 MED ORDER — ONDANSETRON HCL 4 MG/2ML IJ SOLN
INTRAMUSCULAR | Status: DC | PRN
  Administered 2019-05-03: 4 mg via INTRAVENOUS

## 2019-05-03 MED ORDER — PROPOFOL 10 MG/ML IV BOLUS
INTRAVENOUS | Status: DC | PRN
  Administered 2019-05-03: 100 mg via INTRAVENOUS
  Administered 2019-05-03: 20 mg via INTRAVENOUS

## 2019-05-03 MED ORDER — HYDROCODONE-ACETAMINOPHEN 7.5-325 MG PO TABS
1.0000 | ORAL_TABLET | ORAL | Status: DC | PRN
  Administered 2019-05-06 (×2): 2 via ORAL
  Filled 2019-05-03 (×2): qty 2

## 2019-05-03 MED ORDER — LIDOCAINE 2% (20 MG/ML) 5 ML SYRINGE
INTRAMUSCULAR | Status: AC
  Filled 2019-05-03: qty 5

## 2019-05-03 MED ORDER — METOCLOPRAMIDE HCL 5 MG PO TABS: 5.0000 mg | ORAL_TABLET | Freq: Three times a day (TID) | ORAL | Status: DC | PRN

## 2019-05-03 MED ORDER — ONDANSETRON HCL 4 MG/2ML IJ SOLN: 4.0000 mg | Freq: Once | INTRAMUSCULAR | Status: DC | PRN

## 2019-05-03 MED ORDER — SUGAMMADEX SODIUM 200 MG/2ML IV SOLN
INTRAVENOUS | Status: DC | PRN
  Administered 2019-05-03: 200 mg via INTRAVENOUS

## 2019-05-03 MED ORDER — PHENYLEPHRINE HCL-NACL 10-0.9 MG/250ML-% IV SOLN
INTRAVENOUS | Status: DC | PRN
  Administered 2019-05-03: 60 ug/min via INTRAVENOUS

## 2019-05-03 MED ORDER — PHENYLEPHRINE HCL (PRESSORS) 10 MG/ML IV SOLN
INTRAVENOUS | Status: AC
  Filled 2019-05-03: qty 1

## 2019-05-03 MED ORDER — DEXAMETHASONE SODIUM PHOSPHATE 10 MG/ML IJ SOLN
INTRAMUSCULAR | Status: AC
  Filled 2019-05-03: qty 1

## 2019-05-03 MED ORDER — ACETAMINOPHEN 325 MG PO TABS
325.0000 mg | ORAL_TABLET | Freq: Four times a day (QID) | ORAL | Status: DC | PRN
  Administered 2019-05-03: 650 mg via ORAL
  Filled 2019-05-03: qty 2

## 2019-05-03 MED ORDER — MORPHINE SULFATE (PF) 4 MG/ML IV SOLN: 0.5000 mg | INTRAVENOUS | Status: DC | PRN

## 2019-05-03 MED ORDER — VITAMIN D 25 MCG (1000 UNIT) PO TABS
1000.0000 [IU] | ORAL_TABLET | Freq: Every day | ORAL | Status: DC
  Administered 2019-05-03 – 2019-05-07 (×5): 1000 [IU] via ORAL
  Filled 2019-05-03 (×5): qty 1

## 2019-05-03 MED ORDER — FENTANYL CITRATE (PF) 100 MCG/2ML IJ SOLN
INTRAMUSCULAR | Status: AC
  Filled 2019-05-03: qty 2

## 2019-05-03 MED ORDER — HYDROMORPHONE HCL 1 MG/ML IJ SOLN: 0.2500 mg | INTRAMUSCULAR | Status: DC | PRN

## 2019-05-03 MED ORDER — CEFAZOLIN SODIUM-DEXTROSE 2-4 GM/100ML-% IV SOLN
INTRAVENOUS | Status: AC
  Filled 2019-05-03: qty 200

## 2019-05-03 MED ORDER — PROPOFOL 10 MG/ML IV BOLUS
INTRAVENOUS | Status: AC
  Filled 2019-05-03: qty 20

## 2019-05-03 MED ORDER — PHENOL 1.4 % MT LIQD: 1.0000 | OROMUCOSAL | Status: DC | PRN

## 2019-05-03 MED ORDER — METHOCARBAMOL 500 MG IVPB - SIMPLE MED
500.0000 mg | Freq: Four times a day (QID) | INTRAVENOUS | Status: DC | PRN
  Filled 2019-05-03: qty 50

## 2019-05-03 MED ORDER — SUCCINYLCHOLINE CHLORIDE 200 MG/10ML IV SOSY
PREFILLED_SYRINGE | INTRAVENOUS | Status: AC
  Filled 2019-05-03: qty 10

## 2019-05-03 MED ORDER — DOCUSATE SODIUM 100 MG PO CAPS
100.0000 mg | ORAL_CAPSULE | Freq: Two times a day (BID) | ORAL | Status: DC
  Administered 2019-05-03 – 2019-05-07 (×9): 100 mg via ORAL
  Filled 2019-05-03 (×9): qty 1

## 2019-05-03 MED ORDER — MEPERIDINE HCL 50 MG/ML IJ SOLN: 6.2500 mg | INTRAMUSCULAR | Status: DC | PRN

## 2019-05-03 MED ORDER — 0.9 % SODIUM CHLORIDE (POUR BTL) OPTIME
TOPICAL | Status: DC | PRN
  Administered 2019-05-03: 09:00:00 1000 mL

## 2019-05-03 MED ORDER — ONDANSETRON HCL 4 MG/2ML IJ SOLN: 4.0000 mg | Freq: Four times a day (QID) | INTRAMUSCULAR | Status: DC | PRN

## 2019-05-03 MED ORDER — ROCURONIUM BROMIDE 100 MG/10ML IV SOLN
INTRAVENOUS | Status: DC | PRN
  Administered 2019-05-03: 30 mg via INTRAVENOUS

## 2019-05-03 MED ORDER — EPHEDRINE 5 MG/ML INJ
INTRAVENOUS | Status: AC
  Filled 2019-05-03: qty 10

## 2019-05-03 MED ORDER — LACTATED RINGERS IV SOLN: INTRAVENOUS | Status: DC | PRN

## 2019-05-03 MED ORDER — ONDANSETRON HCL 4 MG/2ML IJ SOLN
INTRAMUSCULAR | Status: AC
  Filled 2019-05-03: qty 2

## 2019-05-03 MED ORDER — MENTHOL 3 MG MT LOZG: 1.0000 | LOZENGE | OROMUCOSAL | Status: DC | PRN

## 2019-05-03 MED ORDER — HYDROCODONE-ACETAMINOPHEN 5-325 MG PO TABS
1.0000 | ORAL_TABLET | ORAL | Status: DC | PRN
  Administered 2019-05-05: 1 via ORAL
  Filled 2019-05-03: qty 1

## 2019-05-03 MED ORDER — DEXAMETHASONE SODIUM PHOSPHATE 4 MG/ML IJ SOLN
INTRAMUSCULAR | Status: DC | PRN
  Administered 2019-05-03: 5 mg via INTRAVENOUS

## 2019-05-03 MED ORDER — ONDANSETRON HCL 4 MG PO TABS: 4.0000 mg | ORAL_TABLET | Freq: Four times a day (QID) | ORAL | Status: DC | PRN

## 2019-05-03 MED ORDER — ASPIRIN EC 325 MG PO TBEC
325.0000 mg | DELAYED_RELEASE_TABLET | Freq: Every day | ORAL | Status: DC
  Administered 2019-05-04 – 2019-05-07 (×4): 325 mg via ORAL
  Filled 2019-05-03 (×4): qty 1

## 2019-05-03 SURGICAL SUPPLY — 43 items
BAG ZIPLOCK 12X15 (MISCELLANEOUS) ×3 IMPLANT
BIT DRILL 4.8X300 (BIT) ×2 IMPLANT
BIT DRILL 4.8X300MM (BIT) ×1
BNDG GAUZE ELAST 4 BULKY (GAUZE/BANDAGES/DRESSINGS) ×3 IMPLANT
COVER SURGICAL LIGHT HANDLE (MISCELLANEOUS) ×3 IMPLANT
COVER WAND RF STERILE (DRAPES) IMPLANT
DRAPE STERI IOBAN 125X83 (DRAPES) ×3 IMPLANT
DRESSING AQUACEL AG SP 3.5X4 (GAUZE/BANDAGES/DRESSINGS) ×1 IMPLANT
DRSG AQUACEL AG SP 3.5X4 (GAUZE/BANDAGES/DRESSINGS) ×3
DRSG PAD ABDOMINAL 8X10 ST (GAUZE/BANDAGES/DRESSINGS) ×3 IMPLANT
DRSG TEGADERM 4X4.75 (GAUZE/BANDAGES/DRESSINGS) ×3 IMPLANT
DURAPREP 26ML APPLICATOR (WOUND CARE) ×3 IMPLANT
ELECT REM PT RETURN 15FT ADLT (MISCELLANEOUS) ×3 IMPLANT
FACESHIELD WRAPAROUND (MASK) IMPLANT
GAUZE SPONGE 4X4 12PLY STRL (GAUZE/BANDAGES/DRESSINGS) ×3 IMPLANT
GAUZE XEROFORM 1X8 LF (GAUZE/BANDAGES/DRESSINGS) ×3 IMPLANT
GAUZE XEROFORM 5X9 LF (GAUZE/BANDAGES/DRESSINGS) ×3 IMPLANT
GLOVE BIO SURGEON STRL SZ7.5 (GLOVE) ×3 IMPLANT
GLOVE BIOGEL PI IND STRL 8 (GLOVE) ×1 IMPLANT
GLOVE BIOGEL PI INDICATOR 8 (GLOVE) ×2
GLOVE ECLIPSE 8.0 STRL XLNG CF (GLOVE) ×3 IMPLANT
GOWN STRL REUS W/TWL XL LVL3 (GOWN DISPOSABLE) ×3 IMPLANT
KIT TURNOVER KIT A (KITS) IMPLANT
NS IRRIG 1000ML POUR BTL (IV SOLUTION) ×3 IMPLANT
PACK GENERAL/GYN (CUSTOM PROCEDURE TRAY) ×3 IMPLANT
PAD CAST 4YDX4 CTTN HI CHSV (CAST SUPPLIES) ×1 IMPLANT
PADDING CAST COTTON 4X4 STRL (CAST SUPPLIES) ×2
PENCIL SMOKE EVACUATOR (MISCELLANEOUS) IMPLANT
PIN GUIDE DRILL TIP 2.8X300 (DRILL) ×9 IMPLANT
PROTECTOR NERVE ULNAR (MISCELLANEOUS) ×3 IMPLANT
SCREW CANN THRD 6.5X70 (Screw) ×6 IMPLANT
SCREW CANN THRD 6.5X80 (Screw) ×3 IMPLANT
SPONGE LAP 18X18 RF (DISPOSABLE) IMPLANT
STAPLER VISISTAT 35W (STAPLE) ×3 IMPLANT
SUT VIC AB 0 CT1 27 (SUTURE) ×2
SUT VIC AB 0 CT1 27XBRD ANTBC (SUTURE) ×1 IMPLANT
SUT VIC AB 1 CT1 27 (SUTURE) ×4
SUT VIC AB 1 CT1 27XBRD ANTBC (SUTURE) ×2 IMPLANT
SUT VIC AB 2-0 CT1 27 (SUTURE) ×2
SUT VIC AB 2-0 CT1 TAPERPNT 27 (SUTURE) ×1 IMPLANT
TOWEL OR 17X26 10 PK STRL BLUE (TOWEL DISPOSABLE) ×6 IMPLANT
TRAY FOLEY MTR SLVR 16FR STAT (SET/KITS/TRAYS/PACK) IMPLANT
WATER STERILE IRR 1000ML POUR (IV SOLUTION) ×3 IMPLANT

## 2019-05-03 NOTE — Plan of Care (Signed)
  Problem: Clinical Measurements: Goal: Ability to maintain clinical measurements within normal limits will improve Outcome: Progressing Goal: Will remain free from infection Outcome: Progressing Goal: Diagnostic test results will improve Outcome: Progressing Goal: Respiratory complications will improve Outcome: Progressing Goal: Cardiovascular complication will be avoided Outcome: Progressing   Problem: Activity: Goal: Risk for activity intolerance will decrease Outcome: Progressing   Problem: Nutrition: Goal: Adequate nutrition will be maintained Outcome: Progressing   Problem: Pain Managment: Goal: General experience of comfort will improve Outcome: Progressing   Problem: Safety: Goal: Ability to remain free from injury will improve Outcome: Progressing   Problem: Skin Integrity: Goal: Risk for impaired skin integrity will decrease Outcome: Progressing   Problem: Clinical Measurements: Goal: Postoperative complications will be avoided or minimized Outcome: Progressing   Problem: Pain Management: Goal: Pain level will decrease Outcome: Progressing

## 2019-05-03 NOTE — Consult Note (Signed)
Consultation Note Date: 05/03/2019   Patient Name: [name]  DOB: 29-Dec-1922  MRN: [id number]  Age / Sex: 86 y.o., female  PCP: Soledad Gerlach, MD Referring Physician: Jae Dire, MD  Reason for Consultation: Establishing goals of care  HPI/Patient Profile: 86 y.o. female  admitted on 05/02/2019   86 year old lady with dementia, from facility, admitted with fall and femoral neck fracture status post repair.  Also with possible radial head fracture. Earlier today on 05-03-19, the patient underwent cannulated hip pinning for fractured right hip.  Palliative medicine consult for goals of care.  Clinical Assessment and Goals of Care: [name] is awake alert resting in bed.  There is a breakfast tray in front of her.  She has been transferred back to her room after her surgery this morning.  She is able to answer some questions.  She is able to state her name.  She is able to state her niece [name]'s name.  There is no family at the bedside.  Patient believes that she still lives by herself in her home in [place], [place].  She admits to pain in her back and right hip area.  She is not aware that she fell.  She is not aware of her fractures and surgery done this morning.  Please note additional recommendations as listed below.  Thank you for the consult.  NEXT OF KIN Niece is healthcare power of attorney agent  SUMMARY OF RECOMMENDATIONS   Agree with DO NOT RESUSCITATE Continue current pain and nonpain symptom management Follow immediate postop course, p.o. intake, participation with physical therapy to help guide further decision making and disposition.  Would recommend SNF rehab with palliative care if the patient is able to participate. Thank you for the consult.  Code Status/Advance Care Planning:  DNR    Symptom Management:   Medication history noted.  Pain and nonpain symptom  management active medications noted.  Continue the same.  Palliative Prophylaxis:   Delirium Protocol   Psycho-social/Spiritual:   Desire for further Chaplaincy support:yes  Additional Recommendations: Caregiving  Support/Resources  Prognosis:   Unable to determine  Discharge Planning: To Be Determined      Primary Diagnoses: Present on Admission: . Closed right hip fracture (HCC)   I have reviewed the medical record, interviewed the patient and family, and examined the patient. The following aspects are pertinent.  Past Medical History:  Diagnosis Date  . Alzheimer disease (HCC)   . Dementia (HCC)   . Essential hypertension   . Hyperlipidemia    Social History   Socioeconomic History  . Marital status: Single    Spouse name: Not on file  . Number of children: Not on file  . Years of education: Not on file  . Highest education level: Not on file  Occupational History  . Not on file  Tobacco Use  . Smoking status: Former Smoker    Packs/day: 0.25    Types: Cigarettes  . Smokeless tobacco: Never Used  . Tobacco comment: smoked  socially  Substance and Sexual Activity  . Alcohol use: Never  . Drug use: Never  . Sexual activity: Not on file  Other Topics Concern  . Not on file  Social History Narrative  . Not on file   Social Determinants of Health   Financial Resource Strain:   . Difficulty of Paying Living Expenses: Not on file  Food Insecurity:   . Worried About Charity fundraiser in the Last Year: Not on file  . Ran Out of Food in the Last Year: Not on file  Transportation Needs:   . Lack of Transportation (Medical): Not on file  . Lack of Transportation (Non-Medical): Not on file  Physical Activity:   . Days of Exercise per Week: Not on file  . Minutes of Exercise per Session: Not on file  Stress:   . Feeling of Stress : Not on file  Social Connections:   . Frequency of Communication with Friends and Family: Not on file  . Frequency of  Social Gatherings with Friends and Family: Not on file  . Attends Religious Services: Not on file  . Active Member of Clubs or Organizations: Not on file  . Attends Archivist Meetings: Not on file  . Marital Status: Not on file   History reviewed. No pertinent family history. Scheduled Meds: . [START ON 05/04/2019] aspirin EC  325 mg Oral Q breakfast  . Chlorhexidine Gluconate Cloth  6 each Topical Daily  . docusate sodium  100 mg Oral BID  . heparin  5,000 Units Subcutaneous Q8H  . mouth rinse  15 mL Mouth Rinse BID  . mupirocin ointment  1 application Nasal BID  . pantoprazole  40 mg Oral Daily  . pravastatin  10 mg Oral Daily  . traZODone  50 mg Oral QHS   Continuous Infusions: . ceFAZolin    .  ceFAZolin (ANCEF) IV Stopped (05/03/19 0601)  . methocarbamol (ROBAXIN) IV    . methocarbamol     PRN Meds:.[START ON 05/04/2019] acetaminophen, HYDROcodone-acetaminophen, HYDROcodone-acetaminophen, menthol-cetylpyridinium **OR** phenol, methocarbamol **OR** methocarbamol (ROBAXIN) IV, metoCLOPramide **OR** metoCLOPramide (REGLAN) injection, morphine injection, ondansetron **OR** ondansetron (ZOFRAN) IV, polyethylene glycol Medications Prior to Admission:  Prior to Admission medications   Medication Sig Start Date End Date Taking? Authorizing Provider  acetaminophen (TYLENOL) 325 MG tablet Take 650 mg by mouth every 6 (six) hours as needed for mild pain or headache.   Yes [provider]  guaiFENesin-codeine (ROBITUSSIN AC) 100-10 MG/5ML syrup Take 5 mLs by mouth every 8 (eight) hours as needed for cough.   Yes [provider]  losartan-hydrochlorothiazide (HYZAAR) 100-25 MG tablet Take 1 tablet by mouth daily. 03/05/19  Yes [provider]  Melatonin 5 MG TABS Take 5 mg by mouth at bedtime as needed (sleep).   Yes [provider]  omeprazole (PRILOSEC) 20 MG capsule Take 20 mg by mouth every other day. 03/05/19  Yes [provider]   pravastatin (PRAVACHOL) 10 MG tablet Take 10 mg by mouth daily.  03/05/19  Yes [provider]  traZODone (DESYREL) 50 MG tablet Take 50 mg by mouth at bedtime. 03/05/19  Yes [provider]   Allergies  Allergen Reactions  . Penicillins Other (See Comments)    Unknown Tolerates Keflex   Review of Systems + pain in back and hip area  Physical Exam Elderly lady who is awake reasonably alert resting in bed Patient has dementia.  Does not know that she is in a hospital.  Does not know whether she fell or not.  She does admit to some pain in her back and hip area Regular work of breathing S1-S2 Abdomen is not distended No edema Also admits to pain in right elbow.  Dressing noted.  Vital Signs: BP 114/61 (BP Location: Right Arm)   Pulse 81   Temp 98 F (36.7 C) (Oral)   Resp 17   Ht 5\' 7"  (1.702 m)   Wt 55.2 kg   SpO2 97%   BMI 19.05 kg/m  Pain Scale: 0-10   Pain Score: 0-No pain   SpO2: SpO2: 97 % O2 Device:SpO2: 97 % O2 Flow Rate: .O2 Flow Rate (L/min): 3 L/min  IO: Intake/output summary:   Intake/Output Summary (Last 24 hours) at 05/03/2019 1208 Last data filed at 05/03/2019 1100 Gross per 24 hour  Intake 1099.94 ml  Output 330 ml  Net 769.94 ml    LBM: Last BM Date: (pt unsure) Baseline Weight: Weight: 55.2 kg Most recent weight: Weight: 55.2 kg     Palliative Assessment/Data:   PPS 40%  Time In:  11 Time Out:  12 Time Total:  60  Greater than 50%  of this time was spent counseling and coordinating care related to the above assessment and plan.  Signed by: 07/03/2019, MD   Please contact Palliative Medicine Team phone at [phone] for questions and concerns.  For individual provider: See [phone]

## 2019-05-03 NOTE — Progress Notes (Signed)
PT Cancellation Note  Patient Details Name: [name] MRN: [id number] DOB: 05-Aug-1922   Cancelled Treatment:    Reason Eval/Treat Not Completed: Patient not medically ready - PT orders for start tomorrow, will check back when appropriate.  Richrd Sox, PT Acute Rehabilitation Services Pager [phone]  Office [phone]    [name] 05/03/2019, 12:03 PM

## 2019-05-03 NOTE — Transfer of Care (Signed)
Immediate Anesthesia Transfer of Care Note  Patient: [name]  Procedure(s) Performed: CANNULATED HIP PINNING,dressing change right elbow (Right Hip)  Patient Location: PACU  Anesthesia Type:General  Level of Consciousness: awake and patient cooperative  Airway & Oxygen Therapy: Patient Spontanous Breathing and Patient connected to face mask oxygen  Post-op Assessment: Report given to RN, Post -op Vital signs reviewed and stable and Patient moving all extremities X 4  Post vital signs: stable  Last Vitals:  Vitals Value Taken Time  BP 128/60 05/03/19 0916  Temp    Pulse 80 05/03/19 0921  Resp 17 05/03/19 0921  SpO2 95 % 05/03/19 0921  Vitals shown include unvalidated device data.  Last Pain:  Vitals:   05/03/19 0650  TempSrc:   PainSc: Asleep         Complications: No apparent anesthesia complications

## 2019-05-03 NOTE — Anesthesia Preprocedure Evaluation (Signed)
Anesthesia Evaluation  Patient identified by MRN, date of birth, ID band Patient awake    Reviewed: Allergy & Precautions, NPO status , Patient's Chart, lab work & pertinent test results  Airway Mallampati: I  TM Distance: >3 FB Neck ROM: Full    Dental   Pulmonary former smoker,    Pulmonary exam normal        Cardiovascular hypertension, Pt. on medications Normal cardiovascular exam     Neuro/Psych Dementia    GI/Hepatic   Endo/Other    Renal/GU      Musculoskeletal   Abdominal   Peds  Hematology   Anesthesia Other Findings   Reproductive/Obstetrics                             Anesthesia Physical Anesthesia Plan  ASA: III  Anesthesia Plan: General   Post-op Pain Management:    Induction: Intravenous  PONV Risk Score and Plan: 3 and Ondansetron and Treatment may vary due to age or medical condition  Airway Management Planned: Oral ETT  Additional Equipment:   Intra-op Plan:   Post-operative Plan: Extubation in OR  Informed Consent: I have reviewed the patients History and Physical, chart, labs and discussed the procedure including the risks, benefits and alternatives for the proposed anesthesia with the patient or authorized representative who has indicated his/her understanding and acceptance.       Plan Discussed with: CRNA and Surgeon  Anesthesia Plan Comments:         Anesthesia Quick Evaluation

## 2019-05-03 NOTE — Progress Notes (Signed)
OT Cancellation Note  Patient Details Name: [name] MRN: [id number] DOB: 1922-12-01   Cancelled Treatment:    Reason Eval/Treat Not Completed: Patient not medically ready. Will attempt OT evaluation on different date and time.  Dejia Ebron OTR/L   [name] 05/03/2019, 2:42 PM

## 2019-05-03 NOTE — Evaluation (Signed)
Clinical/Bedside Swallow Evaluation Patient Details  Name: [name] MRN: [id number] Date of Birth: 1923-01-24  Today's Date: 05/03/2019 Time: SLP Start Time (ACUTE ONLY): 1125 SLP Stop Time (ACUTE ONLY): 1145 SLP Time Calculation (min) (ACUTE ONLY): 20 min  Past Medical History:  Past Medical History:  Diagnosis Date  . Alzheimer disease ([place])   . Dementia ([place])   . Essential hypertension   . Hyperlipidemia    Past Surgical History: History reviewed. No pertinent surgical history. HPI:  Patient is a 86 y.o. female who is a resident at [facility] care unit, who was admitted with fall and femoral neck fracture and is s/p repair. PMH: advanced dementia, GERD, HTN, HLD, subdural hematoma, multiple falls in past several months. She was intubated for cannulated hip pinning on 3/6 and extubated after procedure.   Assessment / Plan / Recommendation Clinical Impression  Patient presents with swallow function that appears to be at or near her baseline, secondary to patient reports of throat clearing prior to this hospital admission and history of GERD. She exhibited mild intensity and frequency of throat clearing after sips of thin liquids but her voice remained clear throughout. Throat clearing is likely secondary to GERD, potentially presbyesophagus due to advanced age, but is not likely to be caused by brief intubation that she  had for surgical procedure today ([id number]). SLP Visit Diagnosis: Dysphagia, unspecified (R13.10)    Aspiration Risk  No limitations    Diet Recommendation Thin liquid;Regular   Liquid Administration via: Straw;Cup Medication Administration: Whole meds with liquid Supervision: Patient able to self feed;Comment(patient needs cues to ensure she is eating secondary to dementia-related cognitive impairment) Compensations: Minimize environmental distractions;Slow rate;Small sips/bites    Other  Recommendations Oral Care Recommendations: Oral care  BID;Staff/trained caregiver to provide oral care   Follow up Recommendations None      Frequency and Duration   N/A         Prognosis   N/A     Swallow Study   General Date of Onset: 05/02/19 HPI: Patient is a 86 y.o. female who is a resident at [facility] care unit, who was admitted with fall and femoral neck fracture and is s/p repair. PMH: advanced dementia, GERD, HTN, HLD, subdural hematoma, multiple falls in past several months. She was intubated for cannulated hip pinning on 3/6 and extubated after procedure. Type of Study: Bedside Swallow Evaluation Previous Swallow Assessment: N/A Diet Prior to this Study: Regular;Thin liquids Temperature Spikes Noted: No Respiratory Status: Room air History of Recent Intubation: Yes Length of Intubations (days): 0 days(for surgical procedure only (9326-7124)) Behavior/Cognition: Alert;Pleasant mood;Cooperative;Confused Oral Cavity Assessment: Within Functional Limits Oral Care Completed by SLP: No Oral Cavity - Dentition: Adequate natural dentition Vision: Functional for self-feeding Self-Feeding Abilities: Able to feed self Patient Positioning: Upright in bed Baseline Vocal Quality: Normal Volitional Cough: Weak Volitional Swallow: Able to elicit    Oral/Motor/Sensory Function Overall Oral Motor/Sensory Function: Within functional limits   Ice Chips     Thin Liquid Thin Liquid: Impaired Presentation: Straw;Self Fed Pharyngeal  Phase Impairments: Throat Clearing - Delayed;Throat Clearing - Immediate Other Comments: mild throat clearing observed but voice remained clear. Patient with PMH of GERD which could be causing this. Unlikely that brief intubation is causing throat clearing    Nectar Thick     Honey Thick     Puree Puree: Not tested Other Comments: patient declined   Solid     Solid: Not tested Other Comments: patient declined  Elio Forget Tarrell 05/03/2019,5:25 PM   Angela Nevin, MA,  CCC-SLP Speech Therapy [facility] Acute Rehab Pager: [phone]

## 2019-05-03 NOTE — Anesthesia Postprocedure Evaluation (Signed)
Anesthesia Post Note  Patient: [name]  Procedure(s) Performed: CANNULATED HIP PINNING,dressing change right elbow (Right Hip)     Patient location during evaluation: PACU Anesthesia Type: General Level of consciousness: awake and alert Pain management: pain level controlled Vital Signs Assessment: post-procedure vital signs reviewed and stable Respiratory status: spontaneous breathing, nonlabored ventilation, respiratory function stable and patient connected to nasal cannula oxygen Cardiovascular status: blood pressure returned to baseline and stable Postop Assessment: no apparent nausea or vomiting Anesthetic complications: no    Last Vitals:  Vitals:   05/03/19 1111 05/03/19 1218  BP: 114/61 107/62  Pulse: 81 76  Resp: 17   Temp: 36.7 C 36.6 C  SpO2: 97% 99%    Last Pain:  Vitals:   05/03/19 1218  TempSrc: Oral  PainSc:                  [name] DAVID

## 2019-05-03 NOTE — Progress Notes (Signed)
Patient ID: [name], female   DOB: 1922-03-16, 86 y.o.   MRN: [id number] I have seen the patient this am and have marked her left hip.  She is scheduled for cannulated screw fixation of a right hip femoral neck fracture.  The risks and benefits of surgery have been discussed with her niece who is her POA.  Vitals and labs are stable.  Informed consent is obtained.

## 2019-05-03 NOTE — Progress Notes (Signed)
SLP Cancellation Note  Patient Details Name: [name] MRN: [id number] DOB: 16-Dec-1922   Cancelled treatment:       Reason Eval/Treat Not Completed: Patient at procedure or test/unavailable. SLP will attempt to see patient later this date.  Angela Nevin, MA, CCC-SLP Speech Therapy [facility] Acute Rehab Pager: [phone]

## 2019-05-03 NOTE — Brief Op Note (Signed)
05/02/2019 - 05/03/2019  8:49 AM  PATIENT:  [name]  86 y.o. female  PRE-OPERATIVE DIAGNOSIS:  FRACTURED RIGHT HIP  POST-OPERATIVE DIAGNOSIS:  fractured right hip  PROCEDURE:  Procedure(s): CANNULATED HIP PINNING,dressing change right elbow (Right)  SURGEON:  Surgeon(s) and Role:    Kathryne Hitch, MD - Primary  PHYSICIAN ASSISTANT: Rexene Edison, PA-C  ANESTHESIA:   general  EBL:  5 mL   COUNTS:  YES  DICTATION: .Other Dictation: Dictation Number [id number]  PLAN OF CARE: Admit to inpatient   PATIENT DISPOSITION:  PACU - hemodynamically stable.   Delay start of Pharmacological VTE agent (>24hrs) due to surgical blood loss or risk of bleeding: no

## 2019-05-03 NOTE — Progress Notes (Signed)
PROGRESS NOTE    [name]    Code Status: DNR  KGU:[id number] DOB: 12/08/1922 DOA: 05/02/2019 LOS: 1 days  PCP: Soledad Gerlach, MD CC:  Chief Complaint  Patient presents with   Fall   Hip Pain       Hospital Summary   This is a 86 year old female with a history of advanced dementia from memory care unit, hypertension, hyperlipidemia, subdural hematoma, GERD, multiple falls in the past several months recently completed PT who presented to [facility] ED status post fall with right hip pain after she was noted to be walking to the bathroom and tripped per EMS.  Also noted to have laceration to right forearm.   Notable ED labs: K3.2, glucose 108, T bili 1.7, WBC 13.7, COVID 19 pending ED treatments: Dilaudid 0.5 mg x 1, K-Dur 40 mEq x 1 ED imaging:  Right hip x-ray: Impacted, externally rotated and slightly varus angulated transcervical fracture of right femur, diffuse bony demineralization, degenerative changes of lumbar spine and pelvis;  Right elbow x-ray: Nondisplaced intra-articular fracture involving the lateral articular surface of radial head CXR: Low lung volumes with atelectasis  3/6: S/p right hip pinning with Dr. Magnus Ivan, dressing change of right elbow.  Palliative care consulted: Recommending SNF with palliative care at discharge  A & P   Active Problems:   Closed right hip fracture (HCC)    1. Mechanical fall with resultant right hip fracture and right elbow fracture/laceration; S/p right hip pinning with Dr. Magnus Ivan (05/03/2019) a. Multiple falls in the past few months b. PT eval c. Right elbow dressing change today in OR d. SLP eval e. Palliative care consulted: Recommending SNF with palliative care discharge f. Continue Ancef for now 2. Vitamin D deficiency a. Start supplement 3. Hypertension a. Continue to hold home losartan-HCTZ due to hypokalemia also this may be leading to orthostasis and multiple falls 4. GERD a. Protonix PO 5. Hypokalemia a. Replete  PO b. Check Mg 6. Leukocytosis, likely reactive to fractures a. Will check UA 7. Advanced Dementia a. Continue home trazodone nightly b. Delirium precautions   DVT prophylaxis: Heparin Family Communication: Patient's niece at bedside has been updated  Disposition Plan:   Patient came from:   ALF                                                                                          Anticipated d/c place: TBD  Barriers to d/c: PT eval  Pressure injury documentation    None  Consultants  Orthopedic surgery Palliative care  Procedures  3/6 right hip pinning with Ortho  Antibiotics   Anti-infectives (From admission, onward)   Start     Dose/Rate Route Frequency Ordered Stop   05/03/19 1015  ceFAZolin (ANCEF) IVPB 2g/100 mL premix  Status:  Discontinued     2 g 200 mL/hr over 30 Minutes Intravenous Every 6 hours 05/03/19 1003 05/03/19 1111   05/03/19 0745  ceFAZolin (ANCEF) 2-4 GM/100ML-% IVPB    Note to Pharmacy: Quinn Axe   : cabinet override      05/03/19 0745 05/03/19 1959   05/02/19 2000  ceFAZolin (ANCEF) IVPB  1 g/50 mL premix     1 g 100 mL/hr over 30 Minutes Intravenous Every 8 hours 05/02/19 1938          Subjective   Seen and examined post surgery at bedside in no acute distress resting comfortably and in good spirits.  She is pleasantly confused.  Niece at bedside states no complaints.  Patient denies any complaints  Objective   Vitals:   05/03/19 0948 05/03/19 1001 05/03/19 1111 05/03/19 1218  BP:  125/64 114/61 107/62  Pulse: 82 80 81 76  Resp: (!) 24 16 17    Temp:  98 F (36.7 C) 98 F (36.7 C) 97.9 F (36.6 C)  TempSrc:  Oral Oral Oral  SpO2: 90% 94% 97% 99%  Weight:      Height:        Intake/Output Summary (Last 24 hours) at 05/03/2019 1545 Last data filed at 05/03/2019 1100 Gross per 24 hour  Intake 1099.94 ml  Output 330 ml  Net 769.94 ml   Filed Weights   05/02/19 1947  Weight: 55.2 kg    Examination:  Physical  Exam Vitals and nursing note reviewed.  Constitutional:      Appearance: Normal appearance.  HENT:     Head: Normocephalic and atraumatic.  Eyes:     Conjunctiva/sclera: Conjunctivae normal.  Cardiovascular:     Rate and Rhythm: Normal rate and regular rhythm.  Pulmonary:     Effort: Pulmonary effort is normal.     Breath sounds: Normal breath sounds.  Abdominal:     General: Abdomen is flat.     Palpations: Abdomen is soft.  Musculoskeletal:     Comments: Right elbow with dressing intact Right hip with dressing intact  Skin:    Coloration: Skin is not jaundiced or pale.  Neurological:     Mental Status: She is alert. Mental status is at baseline.  Psychiatric:        Mood and Affect: Mood normal.        Behavior: Behavior normal.     Data Reviewed: I have personally reviewed following labs and imaging studies  CBC: Recent Labs  Lab 05/02/19 1620 05/02/19 2028 05/03/19 0508  WBC 13.7* 12.1* 11.2*  NEUTROABS 10.7*  --   --   HGB 14.1 13.0 12.5  HCT 43.1 41.1 39.2  MCV 90.2 90.7 91.4  PLT 301 286 254   Basic Metabolic Panel: Recent Labs  Lab 05/02/19 1620 05/02/19 1900 05/02/19 2028 05/03/19 0508  NA 142  --   --  139  K 3.2*  --   --  3.5  CL 104  --   --  106  CO2 26  --   --  25  GLUCOSE 108*  --   --  105*  BUN 19  --   --  16  CREATININE 0.89  --  0.84 0.76  CALCIUM 9.4  --   --  8.8*  MG  --  2.1  --   --    GFR: Estimated Creatinine Clearance: 37.5 mL/min (by C-G formula based on SCr of 0.76 mg/dL). Liver Function Tests: Recent Labs  Lab 05/02/19 1620  AST 24  ALT 18  ALKPHOS 76  BILITOT 1.7*  PROT 7.6  ALBUMIN 4.3   No results for input(s): LIPASE, AMYLASE in the last 168 hours. No results for input(s): AMMONIA in the last 168 hours. Coagulation Profile: No results for input(s): INR, PROTIME in the last 168 hours. Cardiac Enzymes: No results  for input(s): CKTOTAL, CKMB, CKMBINDEX, TROPONINI in the last 168 hours. BNP (last 3  results) No results for input(s): PROBNP in the last 8760 hours. HbA1C: No results for input(s): HGBA1C in the last 72 hours. CBG: Recent Labs  Lab 05/02/19 1444  GLUCAP 129*   Lipid Profile: No results for input(s): CHOL, HDL, LDLCALC, TRIG, CHOLHDL, LDLDIRECT in the last 72 hours. Thyroid Function Tests: No results for input(s): TSH, T4TOTAL, FREET4, T3FREE, THYROIDAB in the last 72 hours. Anemia Panel: No results for input(s): VITAMINB12, FOLATE, FERRITIN, TIBC, IRON, RETICCTPCT in the last 72 hours. Sepsis Labs: No results for input(s): PROCALCITON, LATICACIDVEN in the last 168 hours.  Recent Results (from the past 240 hour(s))  SARS CORONAVIRUS 2 (TAT 6-24 HRS) Nasopharyngeal Nasopharyngeal Swab     Status: None   Collection Time: 05/02/19  7:31 PM   Specimen: Nasopharyngeal Swab  Result Value Ref Range Status   SARS Coronavirus 2 NEGATIVE NEGATIVE Final    Comment: (NOTE) SARS-CoV-2 target nucleic acids are NOT DETECTED. The SARS-CoV-2 RNA is generally detectable in upper and lower respiratory specimens during the acute phase of infection. Negative results do not preclude SARS-CoV-2 infection, do not rule out co-infections with other pathogens, and should not be used as the sole basis for treatment or other patient management decisions. Negative results must be combined with clinical observations, patient history, and epidemiological information. The expected result is Negative. Fact Sheet for Patients: HairSlick.no Fact Sheet for Healthcare Providers: quierodirigir.com This test is not yet approved or cleared by the [place] FDA and  has been authorized for detection and/or diagnosis of SARS-CoV-2 by FDA under an Emergency Use Authorization (EUA). This EUA will remain  in effect (meaning this test can be used) for the duration of the COVID-19 declaration under Section 56 4(b)(1) of the Act, 21 U.S.C. section  360bbb-3(b)(1), unless the authorization is terminated or revoked sooner. Performed at [facility], [address], [place], [place] [address]   MRSA PCR Screening     Status: None   Collection Time: 05/02/19  8:24 PM   Specimen: Nasal Mucosa; Nasopharyngeal  Result Value Ref Range Status   MRSA by PCR NEGATIVE NEGATIVE Final    Comment:        The GeneXpert MRSA Assay (FDA approved for NASAL specimens only), is one component of a comprehensive MRSA colonization surveillance program. It is not intended to diagnose MRSA infection nor to guide or monitor treatment for MRSA infections. Performed at [facility], [address], [place], [place] [address]          Radiology Studies: DG Chest 1 View  Result Date: 05/02/2019 CLINICAL DATA:  Fall EXAM: CHEST  1 VIEW COMPARISON:  None. FINDINGS: Low lung volumes with atelectatic changes. Some hazy interstitial changes and indistinctness of the vascularity is noted as well. Cardiac prominence is likely related to portable technique and low volumes. The aorta is calcified. The remaining cardiomediastinal contours are unremarkable. No acute osseous or soft tissue abnormality. Cardiac monitoring leads overlie chest. IMPRESSION: Low lung volumes with atelectatic changes. Hazy interstitial changes and indistinctness of the vascularity could suggest mild edema well. Aortic Atherosclerosis (ICD10-I70.0). Electronically Signed   By: Kreg Shropshire M.D.   On: 05/02/2019 16:19   DG Elbow Complete Right  Result Date: 05/02/2019 CLINICAL DATA:  Fall EXAM: RIGHT ELBOW - COMPLETE 3+ VIEW COMPARISON:  None. FINDINGS: Nondisplaced intra-articular fracture involving the lateral articular surface of the radial head. No significant joint  effusion. No other acute fracture or traumatic malalignment. Mild degenerative changes the elbow. Remaining soft tissues are unremarkable. IMPRESSION: Nondisplaced intra-articular fracture involving the  lateral articular surface of the radial head. Electronically Signed   By: Kreg Shropshire M.D.   On: 05/02/2019 16:15   CT Head Wo Contrast  Result Date: 05/02/2019 CLINICAL DATA:  Fall EXAM: CT HEAD WITHOUT CONTRAST CT CERVICAL SPINE WITHOUT CONTRAST TECHNIQUE: Multidetector CT imaging of the head and cervical spine was performed following the standard protocol without intravenous contrast. Multiplanar CT image reconstructions of the cervical spine were also generated. COMPARISON:  CT head 03/22/2019, CT head cervical spine and maxillofacial 03/21/2019 FINDINGS: CT HEAD FINDINGS Brain: Interval resolution of a previously seen subdural hematomas along the left frontal convexity and falx seen on comparison studies. Stable partially calcified 8 mm sub frontal mass, compatible with meningioma. No associated edema or significant mass effect. No evidence of acute infarction, hemorrhage, hydrocephalus, new extra-axial collection or mass lesion/mass effect. Symmetric prominence of the ventricles, cisterns and sulci compatible with frontal predominant parenchymal volume loss. Patchy and con areas of white matter hypoattenuation are most compatible with moderate chronic microvascular angiopathy. Vascular: Atherosclerotic calcification of the carotid siphons. No hyperdense vessel. Skull: No calvarial fracture or suspicious osseous lesion. No scalp swelling or hematoma. Sinuses/Orbits: Paranasal sinuses and mastoid air cells are predominantly clear. Included orbital structures are unremarkable. Other: None CT CERVICAL SPINE FINDINGS Alignment: Straightening of the normal cervical lordosis without traumatic listhesis. Appearance is similar to prior. No abnormally widened, jumped or perched facets. Normal alignment of the craniocervical and atlantoaxial articulations accounting for rightward head rotation. Skull base and vertebrae: Streak artifact from dental amalgam and patient's metallic necklace may limit detection of subtle  nondisplaced fractures. No acute fracture. No primary bone lesion or focal pathologic process. Soft tissues and spinal canal: No pre or paravertebral fluid or swelling. No visible canal hematoma. Disc levels: Multilevel intervertebral disc height loss with spondylitic endplate changes, facet hypertrophy and uncinate spurring. Disc osteophyte complexes are most pronounced at C5-6 and C6-7 resulting in mild canal stenosis. No severe canal stenosis or foraminal narrowing. Upper chest: No acute abnormality in the upper chest or imaged lung apices. Other: Atherosclerotic calcification of the thoracic aortic arch and left common carotid. Thyroid is largely obscured by streak artifact. IMPRESSION: 1. No acute intracranial abnormality. No scalp swelling or calvarial fracture. 2. Interval resolution of previously seen left frontal convexity and parafalcine subdural hematomas. 3. Chronic frontal predominant parenchymal volume loss and moderate microvascular angiopathy. 4. No acute cervical spine fracture or traumatic listhesis. 5. Multilevel cervical spondylitic changes, as detailed above. 6. Aortic Atherosclerosis (ICD10-I70.0). Electronically Signed   By: Kreg Shropshire M.D.   On: 05/02/2019 18:12   CT Cervical Spine Wo Contrast  Result Date: 05/02/2019 CLINICAL DATA:  Fall EXAM: CT HEAD WITHOUT CONTRAST CT CERVICAL SPINE WITHOUT CONTRAST TECHNIQUE: Multidetector CT imaging of the head and cervical spine was performed following the standard protocol without intravenous contrast. Multiplanar CT image reconstructions of the cervical spine were also generated. COMPARISON:  CT head 03/22/2019, CT head cervical spine and maxillofacial 03/21/2019 FINDINGS: CT HEAD FINDINGS Brain: Interval resolution of a previously seen subdural hematomas along the left frontal convexity and falx seen on comparison studies. Stable partially calcified 8 mm sub frontal mass, compatible with meningioma. No associated edema or significant mass  effect. No evidence of acute infarction, hemorrhage, hydrocephalus, new extra-axial collection or mass lesion/mass effect. Symmetric prominence of the ventricles, cisterns and sulci compatible  with frontal predominant parenchymal volume loss. Patchy and con areas of white matter hypoattenuation are most compatible with moderate chronic microvascular angiopathy. Vascular: Atherosclerotic calcification of the carotid siphons. No hyperdense vessel. Skull: No calvarial fracture or suspicious osseous lesion. No scalp swelling or hematoma. Sinuses/Orbits: Paranasal sinuses and mastoid air cells are predominantly clear. Included orbital structures are unremarkable. Other: None CT CERVICAL SPINE FINDINGS Alignment: Straightening of the normal cervical lordosis without traumatic listhesis. Appearance is similar to prior. No abnormally widened, jumped or perched facets. Normal alignment of the craniocervical and atlantoaxial articulations accounting for rightward head rotation. Skull base and vertebrae: Streak artifact from dental amalgam and patient's metallic necklace may limit detection of subtle nondisplaced fractures. No acute fracture. No primary bone lesion or focal pathologic process. Soft tissues and spinal canal: No pre or paravertebral fluid or swelling. No visible canal hematoma. Disc levels: Multilevel intervertebral disc height loss with spondylitic endplate changes, facet hypertrophy and uncinate spurring. Disc osteophyte complexes are most pronounced at C5-6 and C6-7 resulting in mild canal stenosis. No severe canal stenosis or foraminal narrowing. Upper chest: No acute abnormality in the upper chest or imaged lung apices. Other: Atherosclerotic calcification of the thoracic aortic arch and left common carotid. Thyroid is largely obscured by streak artifact. IMPRESSION: 1. No acute intracranial abnormality. No scalp swelling or calvarial fracture. 2. Interval resolution of previously seen left frontal  convexity and parafalcine subdural hematomas. 3. Chronic frontal predominant parenchymal volume loss and moderate microvascular angiopathy. 4. No acute cervical spine fracture or traumatic listhesis. 5. Multilevel cervical spondylitic changes, as detailed above. 6. Aortic Atherosclerosis (ICD10-I70.0). Electronically Signed   By: Kreg Shropshire M.D.   On: 05/02/2019 18:12   DG C-Arm 1-60 Min-No Report  Result Date: 05/03/2019 Fluoroscopy was utilized by the requesting physician.  No radiographic interpretation.   DG HIP OPERATIVE UNILAT W OR W/O PELVIS RIGHT  Result Date: 05/03/2019 CLINICAL DATA:  Right hip fracture. EXAM: OPERATIVE RIGHT HIP (WITH PELVIS IF PERFORMED) 2 VIEWS TECHNIQUE: Fluoroscopic spot image(s) were submitted for interpretation post-operatively. COMPARISON:  05/02/2019 FINDINGS: Internal fixation of the right hip fracture with 3 screws extending through the femoral head and neck. Right hip is located. IMPRESSION: Internal fixation of the right hip fracture Electronically Signed   By: Richarda Overlie M.D.   On: 05/03/2019 11:00   DG Hip Unilat W or Wo Pelvis 2-3 Views Right  Result Date: 05/02/2019 CLINICAL DATA:  Fall EXAM: DG HIP (WITH OR WITHOUT PELVIS) 2-3V RIGHT COMPARISON:  None. FINDINGS: The osseous structures appear diffusely demineralized which may limit detection of small or nondisplaced fractures. Impacted, externally rotated and slightly varus angulated transcervical fracture of the right femur. Femoral head remains normally located within the acetabulum albeit with some notable a sphericity of the right femoral head which is asymmetric to the contralateral side. The left femur is intact. Remaining bones of the pelvis are intact and congruent with degenerative changes in the lower lumbar spine SI joints and symphysis pubis. These a pathic changes noted upon the iliac crests and greater trochanters bilaterally. Vascular calcium noted in the medial thighs. IMPRESSION: 1. Impacted,  externally rotated and slightly varus angulated transcervical fracture of the right femur. 2. Diffuse bony demineralization. 3. Degenerative changes in the lower lumbar spine, SI joints, and symphysis pubis. Electronically Signed   By: Kreg Shropshire M.D.   On: 05/02/2019 16:14        Scheduled Meds:  [START ON 05/04/2019] aspirin EC  325 mg Oral Q  breakfast   Chlorhexidine Gluconate Cloth  6 each Topical Daily   docusate sodium  100 mg Oral BID   heparin  5,000 Units Subcutaneous Q8H   mouth rinse  15 mL Mouth Rinse BID   mupirocin ointment  1 application Nasal BID   pantoprazole  40 mg Oral Daily   pravastatin  10 mg Oral Daily   traZODone  50 mg Oral QHS   Continuous Infusions:  ceFAZolin      ceFAZolin (ANCEF) IV 1 g (05/03/19 1332)   methocarbamol (ROBAXIN) IV     methocarbamol       Time spent: 20 minutes with over 50% of the time coordinating the patient's care    Harold Hedge, DO [facility] Pager [phone]  Call night coverage person covering after 7pm

## 2019-05-03 NOTE — Anesthesia Procedure Notes (Signed)
Procedure Name: Intubation Date/Time: 05/03/2019 7:57 AM Performed by: Lissa Morales, CRNA Pre-anesthesia Checklist: Patient identified, Emergency Drugs available, Suction available and Patient being monitored Patient Re-evaluated:Patient Re-evaluated prior to induction Oxygen Delivery Method: Circle system utilized Preoxygenation: Pre-oxygenation with 100% oxygen Induction Type: IV induction Ventilation: Mask ventilation without difficulty Laryngoscope Size: Mac and 4 Grade View: Grade II Tube type: Oral Number of attempts: 1 Airway Equipment and Method: Stylet and Oral airway Placement Confirmation: ETT inserted through vocal cords under direct vision,  positive ETCO2 and breath sounds checked- equal and bilateral Tube secured with: Tape Dental Injury: Teeth and Oropharynx as per pre-operative assessment  Difficulty Due To: Difficult Airway- due to anterior larynx

## 2019-05-04 LAB — CBC
HCT: 31.8 % — ABNORMAL LOW (ref 36.0–46.0)
Hemoglobin: 10.1 g/dL — ABNORMAL LOW (ref 12.0–15.0)
MCH: 28.9 pg (ref 26.0–34.0)
MCHC: 31.8 g/dL (ref 30.0–36.0)
MCV: 90.9 fL (ref 80.0–100.0)
Platelets: 227 10*3/uL (ref 150–400)
RBC: 3.5 MIL/uL — ABNORMAL LOW (ref 3.87–5.11)
RDW: 14 % (ref 11.5–15.5)
WBC: 10.3 10*3/uL (ref 4.0–10.5)
nRBC: 0 % (ref 0.0–0.2)

## 2019-05-04 LAB — BASIC METABOLIC PANEL
Anion gap: 8 (ref 5–15)
BUN: 22 mg/dL (ref 8–23)
CO2: 25 mmol/L (ref 22–32)
Calcium: 8.4 mg/dL — ABNORMAL LOW (ref 8.9–10.3)
Chloride: 104 mmol/L (ref 98–111)
Creatinine, Ser: 0.89 mg/dL (ref 0.44–1.00)
GFR calc Af Amer: >60 mL/min (ref >60)
GFR calc non Af Amer: 55 mL/min — ABNORMAL LOW (ref >60)
Glucose, Bld: 100 mg/dL — ABNORMAL HIGH (ref 70–99)
Potassium: 3.5 mmol/L (ref 3.5–5.1)
Sodium: 137 mmol/L (ref 135–145)

## 2019-05-04 NOTE — Progress Notes (Signed)
PROGRESS NOTE    [name]    Code Status: DNR  RDE:[id number] DOB: 11-23-1922 DOA: 05/02/2019 LOS: 2 days  PCP: Soledad Gerlach, MD CC:  Chief Complaint  Patient presents with  . Fall  . Hip Pain       Hospital Summary   This is a 86 year old female with a history of advanced dementia from memory care unit, hypertension, hyperlipidemia, subdural hematoma, GERD, multiple falls in the past several months recently completed PT who presented to [facility] ED status post fall with right hip pain after she was noted to be walking to the bathroom and tripped per EMS.  Also noted to have laceration to right forearm.   Notable ED labs: K3.2, glucose 108, T bili 1.7, WBC 13.7, COVID 19 pending ED treatments: Dilaudid 0.5 mg x 1, K-Dur 40 mEq x 1 ED imaging:  Right hip x-ray: Impacted, externally rotated and slightly varus angulated transcervical fracture of right femur, diffuse bony demineralization, degenerative changes of lumbar spine and pelvis;  Right elbow x-ray: Nondisplaced intra-articular fracture involving the lateral articular surface of radial head CXR: Low lung volumes with atelectasis  3/6: S/p right hip pinning with Dr. Magnus Ivan, dressing change of right elbow.  Palliative care consulted: Recommending SNF with palliative care at discharge  A & P   Active Problems:   Closed right hip fracture (HCC)    1. Mechanical fall with resultant right hip fracture and right elbow fracture/laceration; S/p right hip pinning with Dr. Magnus Ivan (05/03/2019) a. Multiple falls in the past few months b. PT eval -SNF unless ALF can accommodate c. Right elbow dressing changed in OR d. Palliative care consulted: Recommending SNF with palliative care discharge e. Per Ortho: Limit mobility x4 weeks, can put weight on right hip and right elbow but limited mobility with chest pivoting to a wheelchair f. Can discontinue Ancef (completed 3 days) 2. Vitamin D deficiency Start  supplement 3. Hypertension a. Continue to hold home losartan-HCTZ due to hypokalemia also this may be leading to orthostasis and multiple falls 4. GERD Protonix PO 5. Hypokalemia Resolved 6. Leukocytosis, likely reactive to fractures, resolved 7. Advanced Dementia a. Continue home trazodone nightly b. Delirium precautions   DVT prophylaxis: Heparin Family Communication: Patient's niece at bedside has been updated today Disposition Plan:   Patient came from:   ALF                                                                                          Anticipated d/c place: SNF unless ALF can accommodate  Barriers to d/c: [facility] consult for help with placement, can likely discharge tomorrow if bed available  Pressure injury documentation    None  Consultants  Orthopedic surgery Palliative care  Procedures  3/6 right hip pinning with Ortho  Antibiotics   Anti-infectives (From admission, onward)   Start     Dose/Rate Route Frequency Ordered Stop   05/03/19 1015  ceFAZolin (ANCEF) IVPB 2g/100 mL premix  Status:  Discontinued     2 g 200 mL/hr over 30 Minutes Intravenous Every 6 hours 05/03/19 1003 05/03/19 1111   05/03/19 0745  ceFAZolin (ANCEF)  2-4 GM/100ML-% IVPB    Note to Pharmacy: Quinn Axe   : cabinet override      05/03/19 0745 05/03/19 2005   05/02/19 2000  ceFAZolin (ANCEF) IVPB 1 g/50 mL premix     1 g 100 mL/hr over 30 Minutes Intravenous Every 8 hours 05/02/19 1938          Subjective   Patient seen and examined at bedside no acute distress and resting comfortably.  No events overnight.  Tolerating diet.  Denies any chest pain, shortness of breath, fever, nausea, vomiting, urinary complaints. Otherwise ROS negative   Objective   Vitals:   05/03/19 2348 05/04/19 0442 05/04/19 1201 05/04/19 1356  BP: (!) 102/52 109/60 (!) 100/46 (!) 108/53  Pulse: 74 66 78 72  Resp: 18 18 (!) 21   Temp: 97.9 F (36.6 C) 98 F (36.7 C) 98.1 F (36.7 C) 98.2 F  (36.8 C)  TempSrc: Oral Oral Oral Oral  SpO2: 94% 100% 91% 94%  Weight:      Height:        Intake/Output Summary (Last 24 hours) at 05/04/2019 1509 Last data filed at 05/04/2019 1400 Gross per 24 hour  Intake 560 ml  Output 350 ml  Net 210 ml   Filed Weights   05/02/19 1947  Weight: 55.2 kg    Examination:  Physical Exam Vitals and nursing note reviewed.  Constitutional:      Appearance: Normal appearance.  HENT:     Head: Normocephalic and atraumatic.  Eyes:     Conjunctiva/sclera: Conjunctivae normal.  Cardiovascular:     Rate and Rhythm: Normal rate and regular rhythm.  Pulmonary:     Effort: Pulmonary effort is normal.     Breath sounds: Normal breath sounds.  Abdominal:     General: Abdomen is flat.     Palpations: Abdomen is soft.  Musculoskeletal:     Comments: Right hip dressing C/D/I  Skin:    Coloration: Skin is not jaundiced or pale.  Neurological:     Mental Status: She is alert. Mental status is at baseline.  Psychiatric:        Mood and Affect: Mood normal.        Behavior: Behavior normal.     Data Reviewed: I have personally reviewed following labs and imaging studies  CBC: Recent Labs  Lab 05/02/19 1620 05/02/19 2028 05/03/19 0508 05/04/19 0510  WBC 13.7* 12.1* 11.2* 10.3  NEUTROABS 10.7*  --   --   --   HGB 14.1 13.0 12.5 10.1*  HCT 43.1 41.1 39.2 31.8*  MCV 90.2 90.7 91.4 90.9  PLT 301 286 254 227   Basic Metabolic Panel: Recent Labs  Lab 05/02/19 1620 05/02/19 1900 05/02/19 2028 05/03/19 0508 05/04/19 0510  NA 142  --   --  139 137  K 3.2*  --   --  3.5 3.5  CL 104  --   --  106 104  CO2 26  --   --  25 25  GLUCOSE 108*  --   --  105* 100*  BUN 19  --   --  16 22  CREATININE 0.89  --  0.84 0.76 0.89  CALCIUM 9.4  --   --  8.8* 8.4*  MG  --  2.1  --   --   --    GFR: Estimated Creatinine Clearance: 33.7 mL/min (by C-G formula based on SCr of 0.89 mg/dL). Liver Function Tests: Recent Labs  Lab 05/02/19 1620  AST  24  ALT 18  ALKPHOS 76  BILITOT 1.7*  PROT 7.6  ALBUMIN 4.3   No results for input(s): LIPASE, AMYLASE in the last 168 hours. No results for input(s): AMMONIA in the last 168 hours. Coagulation Profile: No results for input(s): INR, PROTIME in the last 168 hours. Cardiac Enzymes: No results for input(s): CKTOTAL, CKMB, CKMBINDEX, TROPONINI in the last 168 hours. BNP (last 3 results) No results for input(s): PROBNP in the last 8760 hours. HbA1C: No results for input(s): HGBA1C in the last 72 hours. CBG: Recent Labs  Lab 05/02/19 1444  GLUCAP 129*   Lipid Profile: No results for input(s): CHOL, HDL, LDLCALC, TRIG, CHOLHDL, LDLDIRECT in the last 72 hours. Thyroid Function Tests: No results for input(s): TSH, T4TOTAL, FREET4, T3FREE, THYROIDAB in the last 72 hours. Anemia Panel: No results for input(s): VITAMINB12, FOLATE, FERRITIN, TIBC, IRON, RETICCTPCT in the last 72 hours. Sepsis Labs: No results for input(s): PROCALCITON, LATICACIDVEN in the last 168 hours.  Recent Results (from the past 240 hour(s))  SARS CORONAVIRUS 2 (TAT 6-24 HRS) Nasopharyngeal Nasopharyngeal Swab     Status: None   Collection Time: 05/02/19  7:31 PM   Specimen: Nasopharyngeal Swab  Result Value Ref Range Status   SARS Coronavirus 2 NEGATIVE NEGATIVE Final    Comment: (NOTE) SARS-CoV-2 target nucleic acids are NOT DETECTED. The SARS-CoV-2 RNA is generally detectable in upper and lower respiratory specimens during the acute phase of infection. Negative results do not preclude SARS-CoV-2 infection, do not rule out co-infections with other pathogens, and should not be used as the sole basis for treatment or other patient management decisions. Negative results must be combined with clinical observations, patient history, and epidemiological information. The expected result is Negative. Fact Sheet for Patients: HairSlick.no Fact Sheet for Healthcare  Providers: quierodirigir.com This test is not yet approved or cleared by the [place] FDA and  has been authorized for detection and/or diagnosis of SARS-CoV-2 by FDA under an Emergency Use Authorization (EUA). This EUA will remain  in effect (meaning this test can be used) for the duration of the COVID-19 declaration under Section 56 4(b)(1) of the Act, 21 U.S.C. section 360bbb-3(b)(1), unless the authorization is terminated or revoked sooner. Performed at [facility], [address] [place], [place] [address]   MRSA PCR Screening     Status: None   Collection Time: 05/02/19  8:24 PM   Specimen: Nasal Mucosa; Nasopharyngeal  Result Value Ref Range Status   MRSA by PCR NEGATIVE NEGATIVE Final    Comment:        The GeneXpert MRSA Assay (FDA approved for NASAL specimens only), is one component of a comprehensive MRSA colonization surveillance program. It is not intended to diagnose MRSA infection nor to guide or monitor treatment for MRSA infections. Performed at [facility], [address] [place], [place] [address]          Radiology Studies: DG Chest 1 View  Result Date: 05/02/2019 CLINICAL DATA:  Fall EXAM: CHEST  1 VIEW COMPARISON:  None. FINDINGS: Low lung volumes with atelectatic changes. Some hazy interstitial changes and indistinctness of the vascularity is noted as well. Cardiac prominence is likely related to portable technique and low volumes. The aorta is calcified. The remaining cardiomediastinal contours are unremarkable. No acute osseous or soft tissue abnormality. Cardiac monitoring leads overlie chest. IMPRESSION: Low lung volumes with atelectatic changes. Hazy interstitial changes and indistinctness of the vascularity could suggest mild edema well.  Aortic Atherosclerosis (ICD10-I70.0). Electronically Signed   By: Kreg ShropshirePrice  DeHay M.D.   On: 05/02/2019 16:19   DG Elbow Complete Right  Result Date:  05/02/2019 CLINICAL DATA:  Fall EXAM: RIGHT ELBOW - COMPLETE 3+ VIEW COMPARISON:  None. FINDINGS: Nondisplaced intra-articular fracture involving the lateral articular surface of the radial head. No significant joint effusion. No other acute fracture or traumatic malalignment. Mild degenerative changes the elbow. Remaining soft tissues are unremarkable. IMPRESSION: Nondisplaced intra-articular fracture involving the lateral articular surface of the radial head. Electronically Signed   By: Kreg ShropshirePrice  DeHay M.D.   On: 05/02/2019 16:15   CT Head Wo Contrast  Result Date: 05/02/2019 CLINICAL DATA:  Fall EXAM: CT HEAD WITHOUT CONTRAST CT CERVICAL SPINE WITHOUT CONTRAST TECHNIQUE: Multidetector CT imaging of the head and cervical spine was performed following the standard protocol without intravenous contrast. Multiplanar CT image reconstructions of the cervical spine were also generated. COMPARISON:  CT head 03/22/2019, CT head cervical spine and maxillofacial 03/21/2019 FINDINGS: CT HEAD FINDINGS Brain: Interval resolution of a previously seen subdural hematomas along the left frontal convexity and falx seen on comparison studies. Stable partially calcified 8 mm sub frontal mass, compatible with meningioma. No associated edema or significant mass effect. No evidence of acute infarction, hemorrhage, hydrocephalus, new extra-axial collection or mass lesion/mass effect. Symmetric prominence of the ventricles, cisterns and sulci compatible with frontal predominant parenchymal volume loss. Patchy and con areas of white matter hypoattenuation are most compatible with moderate chronic microvascular angiopathy. Vascular: Atherosclerotic calcification of the carotid siphons. No hyperdense vessel. Skull: No calvarial fracture or suspicious osseous lesion. No scalp swelling or hematoma. Sinuses/Orbits: Paranasal sinuses and mastoid air cells are predominantly clear. Included orbital structures are unremarkable. Other: None CT  CERVICAL SPINE FINDINGS Alignment: Straightening of the normal cervical lordosis without traumatic listhesis. Appearance is similar to prior. No abnormally widened, jumped or perched facets. Normal alignment of the craniocervical and atlantoaxial articulations accounting for rightward head rotation. Skull base and vertebrae: Streak artifact from dental amalgam and patient's metallic necklace may limit detection of subtle nondisplaced fractures. No acute fracture. No primary bone lesion or focal pathologic process. Soft tissues and spinal canal: No pre or paravertebral fluid or swelling. No visible canal hematoma. Disc levels: Multilevel intervertebral disc height loss with spondylitic endplate changes, facet hypertrophy and uncinate spurring. Disc osteophyte complexes are most pronounced at C5-6 and C6-7 resulting in mild canal stenosis. No severe canal stenosis or foraminal narrowing. Upper chest: No acute abnormality in the upper chest or imaged lung apices. Other: Atherosclerotic calcification of the thoracic aortic arch and left common carotid. Thyroid is largely obscured by streak artifact. IMPRESSION: 1. No acute intracranial abnormality. No scalp swelling or calvarial fracture. 2. Interval resolution of previously seen left frontal convexity and parafalcine subdural hematomas. 3. Chronic frontal predominant parenchymal volume loss and moderate microvascular angiopathy. 4. No acute cervical spine fracture or traumatic listhesis. 5. Multilevel cervical spondylitic changes, as detailed above. 6. Aortic Atherosclerosis (ICD10-I70.0). Electronically Signed   By: Kreg ShropshirePrice  DeHay M.D.   On: 05/02/2019 18:12   CT Cervical Spine Wo Contrast  Result Date: 05/02/2019 CLINICAL DATA:  Fall EXAM: CT HEAD WITHOUT CONTRAST CT CERVICAL SPINE WITHOUT CONTRAST TECHNIQUE: Multidetector CT imaging of the head and cervical spine was performed following the standard protocol without intravenous contrast. Multiplanar CT image  reconstructions of the cervical spine were also generated. COMPARISON:  CT head 03/22/2019, CT head cervical spine and maxillofacial 03/21/2019 FINDINGS: CT HEAD FINDINGS Brain: Interval resolution of  a previously seen subdural hematomas along the left frontal convexity and falx seen on comparison studies. Stable partially calcified 8 mm sub frontal mass, compatible with meningioma. No associated edema or significant mass effect. No evidence of acute infarction, hemorrhage, hydrocephalus, new extra-axial collection or mass lesion/mass effect. Symmetric prominence of the ventricles, cisterns and sulci compatible with frontal predominant parenchymal volume loss. Patchy and con areas of white matter hypoattenuation are most compatible with moderate chronic microvascular angiopathy. Vascular: Atherosclerotic calcification of the carotid siphons. No hyperdense vessel. Skull: No calvarial fracture or suspicious osseous lesion. No scalp swelling or hematoma. Sinuses/Orbits: Paranasal sinuses and mastoid air cells are predominantly clear. Included orbital structures are unremarkable. Other: None CT CERVICAL SPINE FINDINGS Alignment: Straightening of the normal cervical lordosis without traumatic listhesis. Appearance is similar to prior. No abnormally widened, jumped or perched facets. Normal alignment of the craniocervical and atlantoaxial articulations accounting for rightward head rotation. Skull base and vertebrae: Streak artifact from dental amalgam and patient's metallic necklace may limit detection of subtle nondisplaced fractures. No acute fracture. No primary bone lesion or focal pathologic process. Soft tissues and spinal canal: No pre or paravertebral fluid or swelling. No visible canal hematoma. Disc levels: Multilevel intervertebral disc height loss with spondylitic endplate changes, facet hypertrophy and uncinate spurring. Disc osteophyte complexes are most pronounced at C5-6 and C6-7 resulting in mild canal  stenosis. No severe canal stenosis or foraminal narrowing. Upper chest: No acute abnormality in the upper chest or imaged lung apices. Other: Atherosclerotic calcification of the thoracic aortic arch and left common carotid. Thyroid is largely obscured by streak artifact. IMPRESSION: 1. No acute intracranial abnormality. No scalp swelling or calvarial fracture. 2. Interval resolution of previously seen left frontal convexity and parafalcine subdural hematomas. 3. Chronic frontal predominant parenchymal volume loss and moderate microvascular angiopathy. 4. No acute cervical spine fracture or traumatic listhesis. 5. Multilevel cervical spondylitic changes, as detailed above. 6. Aortic Atherosclerosis (ICD10-I70.0). Electronically Signed   By: Lovena Le M.D.   On: 05/02/2019 18:12   DG C-Arm 1-60 Min-No Report  Result Date: 05/03/2019 Fluoroscopy was utilized by the requesting physician.  No radiographic interpretation.   DG HIP OPERATIVE UNILAT W OR W/O PELVIS RIGHT  Result Date: 05/03/2019 CLINICAL DATA:  Right hip fracture. EXAM: OPERATIVE RIGHT HIP (WITH PELVIS IF PERFORMED) 2 VIEWS TECHNIQUE: Fluoroscopic spot image(s) were submitted for interpretation post-operatively. COMPARISON:  05/02/2019 FINDINGS: Internal fixation of the right hip fracture with 3 screws extending through the femoral head and neck. Right hip is located. IMPRESSION: Internal fixation of the right hip fracture Electronically Signed   By: Markus Daft M.D.   On: 05/03/2019 11:00   DG Hip Unilat W or Wo Pelvis 2-3 Views Right  Result Date: 05/02/2019 CLINICAL DATA:  Fall EXAM: DG HIP (WITH OR WITHOUT PELVIS) 2-3V RIGHT COMPARISON:  None. FINDINGS: The osseous structures appear diffusely demineralized which may limit detection of small or nondisplaced fractures. Impacted, externally rotated and slightly varus angulated transcervical fracture of the right femur. Femoral head remains normally located within the acetabulum albeit with some  notable a sphericity of the right femoral head which is asymmetric to the contralateral side. The left femur is intact. Remaining bones of the pelvis are intact and congruent with degenerative changes in the lower lumbar spine SI joints and symphysis pubis. These a pathic changes noted upon the iliac crests and greater trochanters bilaterally. Vascular calcium noted in the medial thighs. IMPRESSION: 1. Impacted, externally rotated and slightly varus angulated  transcervical fracture of the right femur. 2. Diffuse bony demineralization. 3. Degenerative changes in the lower lumbar spine, SI joints, and symphysis pubis. Electronically Signed   By: Kreg Shropshire M.D.   On: 05/02/2019 16:14        Scheduled Meds: . aspirin EC  325 mg Oral Q breakfast  . cholecalciferol  1,000 Units Oral Daily  . docusate sodium  100 mg Oral BID  . heparin  5,000 Units Subcutaneous Q8H  . mouth rinse  15 mL Mouth Rinse BID  . mupirocin ointment  1 application Nasal BID  . pantoprazole  40 mg Oral Daily  . pravastatin  10 mg Oral Daily  . traZODone  50 mg Oral QHS   Continuous Infusions: .  ceFAZolin (ANCEF) IV Stopped (05/04/19 1358)  . methocarbamol (ROBAXIN) IV       Time spent: 16 minutes with over 50% of the time coordinating the patient's care    Jae Dire, DO [facility] Pager [phone]  Call night coverage person covering after 7pm

## 2019-05-04 NOTE — Evaluation (Signed)
Occupational Therapy Evaluation Patient Details Name: [name] MRN: [id number] DOB: 13-Jan-1923 Today's Date: 05/04/2019    History of Present Illness 86 year old lady with dementia, from facility, admitted with fall and femoral neck fracture status post repair.  Also with possible radial head fracture. on 05-03-19, the patient underwent cannulated hip pinning for fractured right hip.   Clinical Impression   Pt admitted with the above diagnoses and presents with below problem list. Pt will benefit from continued acute OT to address the below listed deficits and maximize independence with basic ADLs prior to d/c to venue below. PTA pt was residing in memory care unit at [facility] where she has assist available for ADLs. Pt currently min -mod A +2 with LB ADLs and stand pivot transfers to be able to access [facility] for toileting. Pt tolerated session fairly well, pleasantly confused. Niece present throughout session.      Follow Up Recommendations  Supervision/Assistance - 24 hour;Other (comment);Home health OT(back to memory care unit at [facility])    Equipment Recommendations  None recommended by OT    Recommendations for Other Services       Precautions / Restrictions Precautions Precautions: Fall Restrictions Weight Bearing Restrictions: Yes Other Position/Activity Restrictions: WBAT RLE and through R elbow      Mobility Bed Mobility Overal bed mobility: Needs Assistance Bed Mobility: Supine to Sit     Supine to sit: Min assist;+2 for physical assistance;HOB elevated     General bed mobility comments: utilized bed pad for pivoting hips to full EOB position as well as assist to [employer] and advance RLE across bed.   Transfers Overall transfer level: Needs assistance Equipment used: 2 person hand held assist Transfers: Sit to/from [employer] Sit to Stand: Min assist;+2 physical assistance Stand pivot transfers: Min assist;+2 physical assistance       General  transfer comment: EOB>recliner. assist to steady and sequence. Extra time and effort. Pt tolerated transfer well.     Balance Overall balance assessment: Needs assistance;History of Falls Sitting-balance support: Single extremity supported;Feet supported Sitting balance-Leahy Scale: Fair     Standing balance support: Bilateral upper extremity supported;During functional activity Standing balance-Leahy Scale: Poor Standing balance comment: +2 HHA min A to steady                            ADL either performed or assessed with clinical judgement   ADL Overall ADL's : Needs assistance/impaired Eating/Feeding: Set up;Sitting   Grooming: Minimal assistance;Sitting;Min guard Grooming Details (indicate cue type and reason): combed hair sitting EOB at min guard level.  Upper Body Bathing: Sitting;Moderate assistance   Lower Body Bathing: Moderate assistance;+2 for physical assistance;Sit to/from stand   Upper Body Dressing : Moderate assistance;Sitting   Lower Body Dressing: Moderate assistance;+2 for physical assistance;Sit to/from stand   Toilet Transfer: Minimal assistance;+2 for physical assistance;Stand-pivot;BSC Toilet Transfer Details (indicate cue type and reason): +2 HHA, simulated with EOB>recliner Toileting- Clothing Manipulation and Hygiene: Moderate assistance;+2 for physical assistance;Sit to/from stand         General ADL Comments: Pt completed bed mobility and stand pivot transfer at min A +2, sat EOB a couple of minutes at min guard level.      Vision         Perception     Praxis      Pertinent Vitals/Pain Pain Assessment: Faces Faces Pain Scale: Hurts little more Pain Location: RLE with transfer, grimacing. Pain Descriptors / Indicators: Grimacing;Guarding  Pain Intervention(s): Limited activity within patient's tolerance;Monitored during session;Repositioned     Hand Dominance     Extremity/Trunk Assessment Upper Extremity  Assessment Upper Extremity Assessment: Generalized weakness;RUE deficits/detail RUE Deficits / Details: able to use functionally to comb hair. cues for WBAT through elbow   Lower Extremity Assessment Lower Extremity Assessment: Defer to PT evaluation       Communication Communication Communication: No difficulties   Cognition Arousal/Alertness: Awake/alert Behavior During Therapy: WFL for tasks assessed/performed(pleasantly confused) Overall Cognitive Status: History of cognitive impairments - at baseline                                     General Comments  Niece present throughout session.     Exercises     Shoulder Instructions      Home Living Family/patient expects to be discharged to:: Skilled nursing facility                                        Prior Functioning/Environment Level of Independence: Needs assistance  Gait / Transfers Assistance Needed: walks at baseline, unclear what AD she uses ADL's / Homemaking Assistance Needed: resides at memory care unit of [facility]. assist with ADLs            OT Problem List: Decreased activity tolerance;Impaired balance (sitting and/or standing);Decreased cognition;Decreased safety awareness;Decreased knowledge of use of DME or AE;Decreased knowledge of precautions;Pain      OT Treatment/Interventions: Self-care/ADL training;DME and/or AE instruction;Therapeutic exercise;Therapeutic activities;Patient/family education;Balance training    OT Goals(Current goals can be found in the care plan section) Acute Rehab OT Goals OT Goal Formulation: With patient/family Time For Goal Achievement: 05/18/19 Potential to Achieve Goals: Good ADL Goals Pt Will Perform Grooming: with supervision;sitting Pt Will Perform Lower Body Bathing: with mod assist;sit to/from stand Pt Will Perform Lower Body Dressing: with mod assist;sit to/from stand Pt Will Transfer to Toilet: with mod assist;stand pivot  transfer;bedside commode Pt Will Perform Toileting - Clothing Manipulation and hygiene: with max assist;sit to/from stand Additional ADL Goal #1: Pt will complete bed mobility at min A level to prepare for EOB/OOB ADLs.  OT Frequency: Min 2X/week   Barriers to D/C:            Co-evaluation PT/OT/SLP Co-Evaluation/Treatment: Yes Reason for Co-Treatment: To address functional/ADL transfers;For patient/therapist safety;Necessary to address cognition/behavior during functional activity   OT goals addressed during session: ADL's and self-care      AM-PAC OT "6 Clicks" Daily Activity     Outcome Measure Help from another person eating meals?: None Help from another person taking care of personal grooming?: A Little Help from another person toileting, which includes using toliet, bedpan, or urinal?: A Lot Help from another person bathing (including washing, rinsing, drying)?: A Lot Help from another person to put on and taking off regular upper body clothing?: A Little Help from another person to put on and taking off regular lower body clothing?: A Lot 6 Click Score: 16   End of Session Equipment Utilized During Treatment: Gait belt  Activity Tolerance: Patient tolerated treatment well Patient left: in chair;with call bell/phone within reach;with chair alarm set;with family/visitor present  OT Visit Diagnosis: Unsteadiness on feet (R26.81);Pain;History of falling (Z91.81);Muscle weakness (generalized) (M62.81) Pain - Right/Left: Right Pain - part of body: Leg;Hip  Time: 8841-6606 OT Time Calculation (min): 21 min Charges:  OT General Charges $OT Visit: 1 Visit OT Evaluation $OT Eval Low Complexity: [place], OT Acute Rehabilitation Services Pager: [phone] Office: [phone]   Hortencia Pilar 05/04/2019, 12:51 PM
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
## 2019-05-04 NOTE — Progress Notes (Addendum)
Patient ID: [name], female   DOB: 12-02-1922, 86 y.o.   MRN: [id number] The patient is awake and alert this morning eating breakfast.  Obvious dementia as well.  Does answer some questions and follows commands.  Reports right hip pain appropriately when I do move her hip.. Her dressings are clean and dry.  I would certainly greatly limit her mobility for the next 4 weeks.  Can put weight on her right hip and right elbow, but I would limit her mobility with just pivoting to a wheelchair.  No significant walking thru her right hip for the next 4 weeks. Vitals and H/H stable this am.  Can likely return to memory care unit Monday or Tuesday.

## 2019-05-04 NOTE — Progress Notes (Signed)
   05/04/19 1201  Vitals  Temp 98.1 F (36.7 C)  Temp Source Oral  BP (!) 100/46  MAP (mmHg) (!) 63  BP Location Left Arm  BP Method Automatic  Patient Position (if appropriate) Sitting  Pulse Rate 78  Pulse Rate Source Monitor  Resp (!) 21  Oxygen Therapy  SpO2 91 %  O2 Device Room Air  MEWS Score  MEWS Temp 0  MEWS Systolic 1  MEWS Pulse 0  MEWS RR 1  MEWS LOC 0  MEWS Score 2  MEWS Score Color Yellow  MEWS Assessment  Is this an acute change? Yes  MEWS guidelines implemented *See Row Information* Yellow  Provider Notification  Provider Name/Title Dr. Dairl Ponder  Date Provider Notified 05/04/19  Time Provider Notified 1222  Notification Type Page  Notification Reason Other (Comment) (yellow MEWS )  Response No new orders  Date of Provider Response 05/04/19  Time of Provider Response 1223

## 2019-05-04 NOTE — Discharge Instructions (Signed)
She can get her incisions wet in the shower. New dry dressings as needed. Only needs to be up with assistance and would only get her into a wheelchair. Needs to greatly limit her mobility for the next 4 weeks with very minimal ambulation.

## 2019-05-04 NOTE — Evaluation (Signed)
Physical Therapy Evaluation Patient Details Name: [name] MRN: [id number] DOB: 09-15-1922 Today's Date: 05/04/2019   History of Present Illness  86 year old lady with dementia, from facility, admitted with fall and femoral neck fracture status post repair.  Also with possible radial head fracture. on 05-03-19, the patient underwent cannulated hip pinning for fractured right hip.  Clinical Impression  Pt admitted with above diagnosis.  PTA pt was residing at [facility]/ALF prior to admission. Pt was  very mobile and active per her niece [name]).  Pt currently min assist  +2 for bed mobility and transfers. She is pleasant and cooperative during session today  Will  Likely need SNF placement unless ALF able to manage care or family able to hire incr assist until pt returns to her functional baseline.   Pt currently with functional limitations due to the deficits listed below (see PT Problem List). Pt will benefit from skilled PT to increase their independence and safety with mobility to allow discharge to the venue listed below.       Follow Up Recommendations SNF    Equipment Recommendations  None recommended by PT    Recommendations for Other Services       Precautions / Restrictions Precautions Precautions: Fall;Other (comment) Precaution Comments: transfers only per Dr. Eliberto Ivory progress notes Restrictions Weight Bearing Restrictions: Yes Other Position/Activity Restrictions: WBAT RLE and through R elbow      Mobility  Bed Mobility Overal bed mobility: Needs Assistance Bed Mobility: Supine to Sit     Supine to sit: Min assist;+2 for physical assistance;HOB elevated     General bed mobility comments: utilized bed pad for pivoting hips to full EOB position as well as assist to [employer] and advance RLE across bed.   Transfers Overall transfer level: Needs assistance Equipment used: 2 person hand held assist Transfers: Sit to/from [employer] Sit to Stand:  Min assist;+2 physical assistance Stand pivot transfers: Min assist;+2 physical assistance;+2 safety/equipment       General transfer comment: EOB>recliner. assist to steady and sequence. Extra time and effort. Pt tolerated transfer well. WBing through R elbow and L hand on therapists' hands  Ambulation/Gait             General Gait Details: deferred per MD orders  Stairs            Wheelchair Mobility    Modified Rankin (Stroke Patients Only)       Balance Overall balance assessment: Needs assistance;History of Falls Sitting-balance support: Single extremity supported;Feet supported Sitting balance-Leahy Scale: Fair     Standing balance support: Bilateral upper extremity supported;During functional activity Standing balance-Leahy Scale: Poor Standing balance comment: +2 HHA min A to steady                              Pertinent Vitals/Pain Pain Assessment: Faces Faces Pain Scale: Hurts little more Pain Location: RLE with transfer, grimacing. Pain Descriptors / Indicators: Grimacing;Guarding Pain Intervention(s): Limited activity within patient's tolerance;Monitored during session;Repositioned    Home Living Family/patient expects to be discharged to:: Skilled nursing facility                      Prior Function Level of Independence: Needs assistance   Gait / Transfers Assistance Needed: walks at baseline, unclear what AD she uses  ADL's / Homemaking Assistance Needed: resides at memory care unit of [facility]. assist with ADLs  Hand Dominance        Extremity/Trunk Assessment   Upper Extremity Assessment Upper Extremity Assessment: Generalized weakness;Defer to OT evaluation RUE Deficits / Details: able to use functionally to comb hair. cues for WBAT through elbow    Lower Extremity Assessment Lower Extremity Assessment: RLE deficits/detail RLE Deficits / Details: limited by pain, grossly 2+/5,  resistant to  hip  flexion RLE: Unable to fully assess due to pain       Communication   Communication: No difficulties  Cognition Arousal/Alertness: Awake/alert Behavior During Therapy: WFL for tasks assessed/performed Overall Cognitive Status: History of cognitive impairments - at baseline                                 General Comments: follows commands consistently, oriented to self and her niece.  pleasant and cooperative      General Comments General comments (skin integrity, edema, etc.): Niece present throughout session.     Exercises General Exercises - Lower Extremity Ankle Circles/Pumps: AROM;Both;5 reps   Assessment/Plan    PT Assessment Patient needs continued PT services  PT Problem List Decreased strength;Decreased range of motion;Decreased activity tolerance;Decreased balance;Decreased knowledge of use of DME;Decreased cognition;Decreased mobility;Pain       PT Treatment Interventions DME instruction;Therapeutic exercise;Gait training;Functional mobility training;Therapeutic activities;Patient/family education;Balance training    PT Goals (Current goals can be found in the Care Plan section)  Acute Rehab PT Goals PT Goal Formulation: With patient Time For Goal Achievement: 05/18/19 Potential to Achieve Goals: Good    Frequency Min 3X/week   Barriers to discharge        Co-evaluation PT/OT/SLP Co-Evaluation/Treatment: Yes Reason for Co-Treatment: To address functional/ADL transfers;For patient/therapist safety;Necessary to address cognition/behavior during functional activity PT goals addressed during session: Mobility/safety with mobility OT goals addressed during session: ADL's and self-care       AM-PAC PT "6 Clicks" Mobility  Outcome Measure Help needed turning from your back to your side while in a flat bed without using bedrails?: A Little Help needed moving from lying on your back to sitting on the side of a flat bed without using bedrails?: A  Little Help needed moving to and from a bed to a chair (including a wheelchair)?: A Lot Help needed standing up from a chair using your arms (e.g., wheelchair or bedside chair)?: A Little Help needed to walk in hospital room?: A Lot Help needed climbing 3-5 steps with a railing? : Total 6 Click Score: 14    End of Session Equipment Utilized During Treatment: Gait belt Activity Tolerance: Patient tolerated treatment well Patient left: in chair;with call bell/phone within reach;with chair alarm set;with family/visitor present   PT Visit Diagnosis: Difficulty in walking, not elsewhere classified (R26.2);Muscle weakness (generalized) (M62.81)    Time: 3664-4034 PT Time Calculation (min) (ACUTE ONLY): 22 min   Charges:   PT Evaluation $PT Eval Low Complexity: 1 Low          Ocie Tino, PT   Acute Rehab Dept [facility]): [phone]   05/04/2019   [facility] 05/04/2019, 1:43 PM

## 2019-05-05 ENCOUNTER — Encounter: Payer: Self-pay | Admitting: *Deleted

## 2019-05-05 NOTE — Care Management Important Message (Signed)
Important Message  Patient Details IM Letter given to Lanier Clam RN Case Manager to present to the Patient Name: [name] MRN: [id number] Date of Birth: May 28, 1922   Medicare Important Message Given:  Yes     Caren Macadam 05/05/2019, 12:16 PM

## 2019-05-05 NOTE — TOC Progression Note (Signed)
Transition of Care [facility]) - Progression Note    Patient Details  Name: [name] MRN: [id number] Date of Birth: 08/14/1922  Transition of Care [facility]) CM/SW Contact  Stanley Helmuth, Olegario Messier, RN Phone Number: 05/05/2019, 1:59 PM  Clinical Narrative:   Bed offers given-[facility] chosen-rep [place] following. Awaiting pasrr, calling for auth.    Expected Discharge Plan: Skilled Nursing Facility Barriers to Discharge: SNF Pending bed offer, Awaiting State Approval Financial controller), Insurance Authorization  Expected Discharge Plan and Services Expected Discharge Plan: Skilled Nursing Facility                                               Social Determinants of Health (SDOH) Interventions    Readmission Risk Interventions No flowsheet data found.

## 2019-05-05 NOTE — TOC Initial Note (Signed)
Transition of Care [facility]) - Initial/Assessment Note    Patient Details  Name: [name] MRN: [id number] Date of Birth: June 02, 1922  Transition of Care [facility]) CM/SW Contact:    Lanier Clam, RN Phone Number: 05/05/2019, 12:12 PM  Clinical Narrative:  Need higher level of care.Faxed out to SNF-Awaiting pasrr,bed offers,insurance auth.                 Expected Discharge Plan: Skilled Nursing Facility Barriers to Discharge: SNF Pending bed offer, Awaiting State Approval (PASRR), Insurance Authorization   Patient Goals and CMS Choice   CMS Medicare.gov Compare Post Acute Care list provided to:: Patient Represenative (must comment)    Expected Discharge Plan and Services Expected Discharge Plan: Skilled Nursing Facility                                              Prior Living Arrangements/Services                       Activities of Daily Living Home Assistive Devices/Equipment: None ADL Screening (condition at time of admission) Patient's cognitive ability adequate to safely complete daily activities?: Yes Is the patient deaf or have difficulty hearing?: No Does the patient have difficulty seeing, even when wearing glasses/contacts?: No Does the patient have difficulty concentrating, remembering, or making decisions?: Yes Patient able to express need for assistance with ADLs?: Yes Does the patient have difficulty dressing or bathing?: No Independently performs ADLs?: Yes (appropriate for developmental age) Does the patient have difficulty walking or climbing stairs?: Yes Weakness of Legs: Right Weakness of Arms/Hands: Right  Permission Sought/Granted                  Emotional Assessment              Admission diagnosis:  Fall [W19.XXXA] Closed right hip fracture (HCC) [S72.001A] Patient Active Problem List   Diagnosis Date Noted  . Closed right hip fracture (HCC) 05/02/2019  . Subdural hematoma (HCC) 03/21/2019  . Essential hypertension  03/21/2019  . Dementia without behavioral disturbance (HCC) 03/21/2019  . Hyperlipidemia 03/21/2019  . Hypokalemia 03/21/2019   PCP:  Soledad Gerlach, MD Pharmacy:  No Pharmacies Listed    Social Determinants of Health (SDOH) Interventions    Readmission Risk Interventions No flowsheet data found.

## 2019-05-05 NOTE — NC FL2 (Signed)
[place] MEDICAID FL2 LEVEL OF CARE SCREENING TOOL     IDENTIFICATION  Patient Name: [name] Birthdate: 1922/12/11 Sex: female Admission Date (Current Location): 05/02/2019  [facility] and [place] Number:  Producer, television/film/video and Address:  [facility],  [address] [place], [place] [address]      Provider Number: [id number]  Attending Physician Name and Address:  Jae Dire, MD  Relative Name and Phone Number:  Levan Hurst [phone]    Current Level of Care: Hospital Recommended Level of Care: Skilled Nursing Facility Prior Approval Number:    Date Approved/Denied:   PASRR Number:    Discharge Plan: SNF    Current Diagnoses: Patient Active Problem List   Diagnosis Date Noted  . Closed right hip fracture (HCC) 05/02/2019  . Subdural hematoma (HCC) 03/21/2019  . Essential hypertension 03/21/2019  . Dementia without behavioral disturbance (HCC) 03/21/2019  . Hyperlipidemia 03/21/2019  . Hypokalemia 03/21/2019    Orientation RESPIRATION BLADDER Height & Weight     Self  Normal Incontinent Weight: 55.2 kg Height:  5\' 7"  (170.2 cm)  BEHAVIORAL SYMPTOMS/MOOD NEUROLOGICAL BOWEL NUTRITION STATUS      Incontinent Diet(Regular thin)  AMBULATORY STATUS COMMUNICATION OF NEEDS Skin   Limited Assist Verbally Normal                       Personal Care Assistance Level of Assistance  Bathing, Feeding, Dressing Bathing Assistance: Limited assistance Feeding assistance: Limited assistance Dressing Assistance: Limited assistance     Functional Limitations Info  Sight, Hearing, Speech Sight Info: Adequate Hearing Info: Adequate Speech Info: Adequate    SPECIAL CARE FACTORS FREQUENCY  PT (By licensed PT), OT (By licensed OT)     PT Frequency: 5x week OT Frequency: 5x week            Contractures Contractures Info: Not present    Additional Factors Info  Code Status, Allergies Code Status Info: DNR Allergies Info: Penicillins            Current Medications (05/05/2019):  This is the current hospital active medication list Current Facility-Administered Medications  Medication Dose Route Frequency Provider Last Rate Last Admin  . acetaminophen (TYLENOL) tablet 325-650 mg  325-650 mg Oral Q6H PRN 07/05/2019, MD   650 mg at 05/03/19 1845  . aspirin EC tablet 325 mg  325 mg Oral Q breakfast 07/03/19, MD   325 mg at 05/05/19 0825  . cholecalciferol (VITAMIN D3) tablet 1,000 Units  1,000 Units Oral Daily 07/05/19, MD   1,000 Units at 05/05/19 [phone]  . docusate sodium (COLACE) capsule 100 mg  100 mg Oral BID 4332, MD   100 mg at 05/05/19 0947  . heparin injection 5,000 Units  5,000 Units Subcutaneous Q8H 07/05/19, MD   5,000 Units at 05/05/19 [phone]  . HYDROcodone-acetaminophen (NORCO) 7.5-325 MG per tablet 1-2 tablet  1-2 tablet Oral Q4H PRN 06-27-1976, MD      . HYDROcodone-acetaminophen (NORCO/VICODIN) 5-325 MG per tablet 1-2 tablet  1-2 tablet Oral Q4H PRN Kathryne Hitch, MD      . MEDLINE mouth rinse  15 mL Mouth Rinse BID Kathryne Hitch, MD   15 mL at 05/05/19 0948  . menthol-cetylpyridinium (CEPACOL) lozenge 3 mg  1 lozenge Oral PRN 07/05/19, MD       Or  . phenol (CHLORASEPTIC) mouth spray 1 spray  1  spray Mouth/Throat PRN Mcarthur Rossetti, MD      . methocarbamol (ROBAXIN) tablet 500 mg  500 mg Oral Q6H PRN Mcarthur Rossetti, MD   500 mg at 05/03/19 1845   Or  . methocarbamol (ROBAXIN) 500 mg in dextrose 5 % 50 mL IVPB  500 mg Intravenous Q6H PRN Mcarthur Rossetti, MD      . metoCLOPramide (REGLAN) tablet 5-10 mg  5-10 mg Oral Q8H PRN Mcarthur Rossetti, MD       Or  . metoCLOPramide (REGLAN) injection 5-10 mg  5-10 mg Intravenous Q8H PRN Mcarthur Rossetti, MD      . morphine 4 MG/ML injection 0.52-1 mg  0.52-1 mg Intravenous Q2H PRN Mcarthur Rossetti, MD      . mupirocin ointment  (BACTROBAN) 2 % 1 application  1 application Nasal BID Mcarthur Rossetti, MD   1 application at 61/60/73 [phone]  . ondansetron (ZOFRAN) tablet 4 mg  4 mg Oral Q6H PRN Mcarthur Rossetti, MD       Or  . ondansetron [facility]) injection 4 mg  4 mg Intravenous Q6H PRN Mcarthur Rossetti, MD      . pantoprazole (PROTONIX) EC tablet 40 mg  40 mg Oral Daily Mcarthur Rossetti, MD   40 mg at 05/05/19 0947  . polyethylene glycol (MIRALAX / GLYCOLAX) packet 17 g  17 g Oral Daily PRN Mcarthur Rossetti, MD      . pravastatin (PRAVACHOL) tablet 10 mg  10 mg Oral Daily Mcarthur Rossetti, MD   10 mg at 05/05/19 0947  . traZODone (DESYREL) tablet 50 mg  50 mg Oral QHS Mcarthur Rossetti, MD   50 mg at 05/04/19 2204     Discharge Medications: Please see discharge summary for a list of discharge medications.  Relevant Imaging Results:  Relevant Lab Results:   Additional Information ss#[id number] [place], [place]

## 2019-05-05 NOTE — TOC Progression Note (Signed)
Transition of Care [facility]) - Progression Note    Patient Details  Name: [name] MRN: [id number] Date of Birth: November 11, 1922  Transition [facility] Care [facility]) CM/SW Contact  Kacen Mellinger, Olegario Messier, RN Phone Number: 05/05/2019, 2:32 PM  Clinical Narrative: Berkley Harvey started-ref#[id number] faxed w/confirmation H&P,PT/OT notes,MD notes.await auth for start of care 3/9,await pasrr#,covid results.      Expected Discharge Plan: Skilled Nursing Facility Barriers to Discharge: SNF Pending bed offer, Awaiting State Approval Financial controller), Insurance Authorization  Expected Discharge Plan and Services Expected Discharge Plan: Skilled Nursing Facility                                               Social Determinants of Health (SDOH) Interventions    Readmission Risk Interventions No flowsheet data found.

## 2019-05-05 NOTE — Progress Notes (Signed)
PROGRESS NOTE    Sagrario Lineberry    Code Status: DNR  MIW:[id number] DOB: October 02, 1922 DOA: 05/02/2019 LOS: 3 days  PCP: Soledad Gerlach, MD CC:  Chief Complaint  Patient presents with  . Fall  . Hip Pain       Hospital Summary   This is a 86 year old female with a history of advanced dementia from memory care unit, hypertension, hyperlipidemia, subdural hematoma, GERD, multiple falls in the past several months recently completed PT who presented to [facility] ED status post fall with right hip pain after she was noted to be walking to the bathroom and tripped per EMS.  Also noted to have laceration to right forearm.   Notable ED labs: K3.2, glucose 108, T bili 1.7, WBC 13.7, COVID 19 pending ED treatments: Dilaudid 0.5 mg x 1, K-Dur 40 mEq x 1 ED imaging:  Right hip x-ray: Impacted, externally rotated and slightly varus angulated transcervical fracture of right femur, diffuse bony demineralization, degenerative changes of lumbar spine and pelvis;  Right elbow x-ray: Nondisplaced intra-articular fracture involving the lateral articular surface of radial head CXR: Low lung volumes with atelectasis  3/6: S/p right hip pinning with Dr. Magnus Ivan, dressing change of right elbow.  Palliative care consulted: Recommending SNF with palliative care at discharge  A & P   Active Problems:   Closed right hip fracture (HCC)    1. Mechanical fall with resultant right hip fracture and right elbow fracture/laceration; S/p right hip pinning with Dr. Magnus Ivan (05/03/2019) a. Multiple falls in the past few months b. PT eval -SNF  c. Per Ortho: Limit mobility x4 weeks, can put weight on right hip and right elbow but limited mobility with chest pivoting to a wheelchair d. completed 3 days Ancef  2. Vitamin D deficiency Started supplement 3. Hypertension Continue to hold home losartan-HCTZ due to hypokalemia also this may be leading to orthostasis and multiple falls. BP stable 4. GERD Protonix PO 5. Hypokalemia  Resolved 6. Leukocytosis, likely reactive to fractures, resolved 7. Advanced Dementia a. Continue home trazodone nightly b. Delirium precautions c. Palliative care consulted: Recommending palliative care at SNF   DVT prophylaxis: Heparin Family Communication: Patient's niece at bedside has been updated today  Disposition Plan:   Patient came from:   ALF                                                                                          Anticipated d/c place: SNF   Barriers to d/c:  awaiting SNF availability  Pressure injury documentation    None  Consultants  Orthopedic surgery Palliative care  Procedures  3/6 right hip pinning with Ortho  Antibiotics   Anti-infectives (From admission, onward)   Start     Dose/Rate Route Frequency Ordered Stop   05/03/19 1015  ceFAZolin (ANCEF) IVPB 2g/100 mL premix  Status:  Discontinued     2 g 200 mL/hr over 30 Minutes Intravenous Every 6 hours 05/03/19 1003 05/03/19 1111   05/03/19 0745  ceFAZolin (ANCEF) 2-4 GM/100ML-% IVPB    Note to Pharmacy: Quinn Axe   : cabinet override      05/03/19  0745 05/03/19 2005   05/02/19 2000  ceFAZolin (ANCEF) IVPB 1 g/50 mL premix  Status:  Discontinued     1 g 100 mL/hr over 30 Minutes Intravenous Every 8 hours 05/02/19 1938 05/04/19 1510        Subjective   Patient seen and examined at bedside no acute distress and resting comfortably.  No events overnight.  Tolerating diet.  Denies any chest pain, shortness of breath, fever, nausea, vomiting, urinary complaints. Otherwise ROS negative   Objective   Vitals:   05/04/19 1956 05/05/19 0023 05/05/19 0450 05/05/19 1314  BP: (!) 110/52 (!) 122/58 120/61 (!) 115/49  Pulse: 84 78 68 79  Resp: 18 18 18  (!) 22  Temp: 98.4 F (36.9 C) (!) 97.5 F (36.4 C) (!) 97.5 F (36.4 C) 98.3 F (36.8 C)  TempSrc: Oral Oral Oral Oral  SpO2: 96% 97% 99% 100%  Weight:      Height:        Intake/Output Summary (Last 24 hours) at 05/05/2019  1328 Last data filed at 05/05/2019 0930 Gross per 24 hour  Intake 590 ml  Output 50 ml  Net 540 ml   Filed Weights   05/02/19 1947  Weight: 55.2 kg    Examination:  Physical Exam Vitals and nursing note reviewed.  Constitutional:      Appearance: Normal appearance.  HENT:     Head: Normocephalic and atraumatic.  Eyes:     Conjunctiva/sclera: Conjunctivae normal.  Cardiovascular:     Rate and Rhythm: Normal rate and regular rhythm.  Pulmonary:     Effort: Pulmonary effort is normal.     Breath sounds: Normal breath sounds.  Abdominal:     General: Abdomen is flat.     Palpations: Abdomen is soft.  Musculoskeletal:        General: No swelling or tenderness.  Skin:    Coloration: Skin is not jaundiced or pale.  Neurological:     Mental Status: She is alert. Mental status is at baseline.  Psychiatric:        Mood and Affect: Mood normal.        Behavior: Behavior normal.     Data Reviewed: I have personally reviewed following labs and imaging studies  CBC: Recent Labs  Lab 05/02/19 1620 05/02/19 2028 05/03/19 0508 05/04/19 0510  WBC 13.7* 12.1* 11.2* 10.3  NEUTROABS 10.7*  --   --   --   HGB 14.1 13.0 12.5 10.1*  HCT 43.1 41.1 39.2 31.8*  MCV 90.2 90.7 91.4 90.9  PLT 301 286 254 160   Basic Metabolic Panel: Recent Labs  Lab 05/02/19 1620 05/02/19 1900 05/02/19 2028 05/03/19 0508 05/04/19 0510  NA 142  --   --  139 137  K 3.2*  --   --  3.5 3.5  CL 104  --   --  106 104  CO2 26  --   --  25 25  GLUCOSE 108*  --   --  105* 100*  BUN 19  --   --  16 22  CREATININE 0.89  --  0.84 0.76 0.89  CALCIUM 9.4  --   --  8.8* 8.4*  MG  --  2.1  --   --   --    GFR: Estimated Creatinine Clearance: 33.7 mL/min (by C-G formula based on SCr of 0.89 mg/dL). Liver Function Tests: Recent Labs  Lab 05/02/19 1620  AST 24  ALT 18  ALKPHOS 76  BILITOT 1.7*  PROT 7.6  ALBUMIN 4.3   No results for input(s): LIPASE, AMYLASE in the last 168 hours. No results  for input(s): AMMONIA in the last 168 hours. Coagulation Profile: No results for input(s): INR, PROTIME in the last 168 hours. Cardiac Enzymes: No results for input(s): CKTOTAL, CKMB, CKMBINDEX, TROPONINI in the last 168 hours. BNP (last 3 results) No results for input(s): PROBNP in the last 8760 hours. HbA1C: No results for input(s): HGBA1C in the last 72 hours. CBG: Recent Labs  Lab 05/02/19 1444  GLUCAP 129*   Lipid Profile: No results for input(s): CHOL, HDL, LDLCALC, TRIG, CHOLHDL, LDLDIRECT in the last 72 hours. Thyroid Function Tests: No results for input(s): TSH, T4TOTAL, FREET4, T3FREE, THYROIDAB in the last 72 hours. Anemia Panel: No results for input(s): VITAMINB12, FOLATE, FERRITIN, TIBC, IRON, RETICCTPCT in the last 72 hours. Sepsis Labs: No results for input(s): PROCALCITON, LATICACIDVEN in the last 168 hours.  Recent Results (from the past 240 hour(s))  SARS CORONAVIRUS 2 (TAT 6-24 HRS) Nasopharyngeal Nasopharyngeal Swab     Status: None   Collection Time: 05/02/19  7:31 PM   Specimen: Nasopharyngeal Swab  Result Value Ref Range Status   SARS Coronavirus 2 NEGATIVE NEGATIVE Final    Comment: (NOTE) SARS-CoV-2 target nucleic acids are NOT DETECTED. The SARS-CoV-2 RNA is generally detectable in upper and lower respiratory specimens during the acute phase of infection. Negative results do not preclude SARS-CoV-2 infection, do not rule out co-infections with other pathogens, and should not be used as the sole basis for treatment or other patient management decisions. Negative results must be combined with clinical observations, patient history, and epidemiological information. The expected result is Negative. Fact Sheet for Patients: HairSlick.no Fact Sheet for Healthcare Providers: quierodirigir.com This test is not yet approved or cleared by the [place] FDA and  has been authorized for detection  and/or diagnosis of SARS-CoV-2 by FDA under an Emergency Use Authorization (EUA). This EUA will remain  in effect (meaning this test can be used) for the duration of the COVID-19 declaration under Section 56 4(b)(1) of the Act, 21 U.S.C. section 360bbb-3(b)(1), unless the authorization is terminated or revoked sooner. Performed at [facility], [address], [place], [place] [address]   MRSA PCR Screening     Status: None   Collection Time: 05/02/19  8:24 PM   Specimen: Nasal Mucosa; Nasopharyngeal  Result Value Ref Range Status   MRSA by PCR NEGATIVE NEGATIVE Final    Comment:        The GeneXpert MRSA Assay (FDA approved for NASAL specimens only), is one component of a comprehensive MRSA colonization surveillance program. It is not intended to diagnose MRSA infection nor to guide or monitor treatment for MRSA infections. Performed at [facility], [address], [place], [place] [address]          Radiology Studies: No results found.      Scheduled Meds: . aspirin EC  325 mg Oral Q breakfast  . cholecalciferol  1,000 Units Oral Daily  . docusate sodium  100 mg Oral BID  . heparin  5,000 Units Subcutaneous Q8H  . mouth rinse  15 mL Mouth Rinse BID  . mupirocin ointment  1 application Nasal BID  . pantoprazole  40 mg Oral Daily  . pravastatin  10 mg Oral Daily  . traZODone  50 mg Oral QHS   Continuous Infusions: . methocarbamol (ROBAXIN) IV       Time spent: 15 minutes  with over 50% of the time coordinating the patient's care    Jae Dire, DO [facility] Pager [phone]  Call night coverage person covering after 7pm

## 2019-05-05 NOTE — NC FL2 (Addendum)
[place] MEDICAID FL2 LEVEL OF CARE SCREENING TOOL     IDENTIFICATION  Patient Name: [name] Birthdate: Dec 11, 1922 Sex: female Admission Date (Current Location): 05/02/2019  [facility] and [place] Number:  Producer, television/film/video and Address:  [facility],  [address] [place], [place] [address]      Provider Number: [id number]  Attending Physician Name and Address:  Jae Dire, MD  Relative Name and Phone Number:  Levan Hurst [phone]    Current Level of Care: Hospital Recommended Level of Care: Skilled Nursing Facility Prior Approval Number:   Date Approved/Denied:   PASRR Number:  [id number]  Discharge Plan: SNF    Current Diagnoses: Patient Active Problem List   Diagnosis Date Noted  . Closed right hip fracture (HCC) 05/02/2019  . Subdural hematoma (HCC) 03/21/2019  . Essential hypertension 03/21/2019  . Dementia without behavioral disturbance (HCC) 03/21/2019  . Hyperlipidemia 03/21/2019  . Hypokalemia 03/21/2019    Orientation RESPIRATION BLADDER Height & Weight     Self  Normal Incontinent Weight: 55.2 kg Height:  5\' 7"  (170.2 cm)  BEHAVIORAL SYMPTOMS/MOOD NEUROLOGICAL BOWEL NUTRITION STATUS      Incontinent Diet(Regular thin)  AMBULATORY STATUS COMMUNICATION OF NEEDS Skin   Limited Assist Verbally Normal                       Personal Care Assistance Level of Assistance  Bathing, Feeding, Dressing Bathing Assistance: Limited assistance Feeding assistance: Limited assistance Dressing Assistance: Limited assistance     Functional Limitations Info  Sight, Hearing, Speech Sight Info: Adequate Hearing Info: Adequate Speech Info: Adequate    SPECIAL CARE FACTORS FREQUENCY  PT (By licensed PT), OT (By licensed OT)     PT Frequency: 5x week OT Frequency: 5x week            Contractures Contractures Info: Not present    Additional Factors Info  Code Status, Allergies Code Status Info: DNR Allergies Info: Penicillins            Current Medications (05/05/2019):  This is the current hospital active medication list Current Facility-Administered Medications  Medication Dose Route Frequency Provider Last Rate Last Admin  . acetaminophen (TYLENOL) tablet 325-650 mg  325-650 mg Oral Q6H PRN 07/05/2019, MD   650 mg at 05/03/19 1845  . aspirin EC tablet 325 mg  325 mg Oral Q breakfast 07/03/19, MD   325 mg at 05/05/19 0825  . cholecalciferol (VITAMIN D3) tablet 1,000 Units  1,000 Units Oral Daily 07/05/19, MD   1,000 Units at 05/05/19 [phone]  . docusate sodium (COLACE) capsule 100 mg  100 mg Oral BID 1027, MD   100 mg at 05/05/19 0947  . heparin injection 5,000 Units  5,000 Units Subcutaneous Q8H 07/05/19, MD   5,000 Units at 05/05/19 [phone]  . HYDROcodone-acetaminophen (NORCO) 7.5-325 MG per tablet 1-2 tablet  1-2 tablet Oral Q4H PRN 06-27-1976, MD      . HYDROcodone-acetaminophen (NORCO/VICODIN) 5-325 MG per tablet 1-2 tablet  1-2 tablet Oral Q4H PRN Kathryne Hitch, MD      . MEDLINE mouth rinse  15 mL Mouth Rinse BID Kathryne Hitch, MD   15 mL at 05/05/19 0948  . menthol-cetylpyridinium (CEPACOL) lozenge 3 mg  1 lozenge Oral PRN 07/05/19, MD       Or  . phenol (CHLORASEPTIC) mouth spray 1 spray  1 spray  Mouth/Throat PRN Mcarthur Rossetti, MD      . methocarbamol (ROBAXIN) tablet 500 mg  500 mg Oral Q6H PRN Mcarthur Rossetti, MD   500 mg at 05/03/19 1845   Or  . methocarbamol (ROBAXIN) 500 mg in dextrose 5 % 50 mL IVPB  500 mg Intravenous Q6H PRN Mcarthur Rossetti, MD      . metoCLOPramide (REGLAN) tablet 5-10 mg  5-10 mg Oral Q8H PRN Mcarthur Rossetti, MD       Or  . metoCLOPramide (REGLAN) injection 5-10 mg  5-10 mg Intravenous Q8H PRN Mcarthur Rossetti, MD      . morphine 4 MG/ML injection 0.52-1 mg  0.52-1 mg Intravenous Q2H PRN Mcarthur Rossetti, MD      .  mupirocin ointment (BACTROBAN) 2 % 1 application  1 application Nasal BID Mcarthur Rossetti, MD   1 application at 01/75/10 [phone]  . ondansetron (ZOFRAN) tablet 4 mg  4 mg Oral Q6H PRN Mcarthur Rossetti, MD       Or  . ondansetron [facility]) injection 4 mg  4 mg Intravenous Q6H PRN Mcarthur Rossetti, MD      . pantoprazole (PROTONIX) EC tablet 40 mg  40 mg Oral Daily Mcarthur Rossetti, MD   40 mg at 05/05/19 0947  . polyethylene glycol (MIRALAX / GLYCOLAX) packet 17 g  17 g Oral Daily PRN Mcarthur Rossetti, MD      . pravastatin (PRAVACHOL) tablet 10 mg  10 mg Oral Daily Mcarthur Rossetti, MD   10 mg at 05/05/19 0947  . traZODone (DESYREL) tablet 50 mg  50 mg Oral QHS Mcarthur Rossetti, MD   50 mg at 05/04/19 2204     Discharge Medications: Please see discharge summary for a list of discharge medications.  Relevant Imaging Results:  Relevant Lab Results:   Additional Information ss[phone], [place], [place]

## 2019-05-05 NOTE — Progress Notes (Signed)
Physical Therapy Treatment Patient Details Name: [name] MRN: [id number] DOB: [birth year] Today's Date: 05/05/2019    History of Present Illness 86 year old lady with dementia, from facility, admitted with fall and femoral neck fracture status post repair.  Also with possible radial head fracture. on 05-03-19, the patient underwent cannulated hip pinning for fractured right hip.    PT Comments    Pt appears more confused today and wearing mittens.  Pt assisted with mini sit to stand to reposition in bed.  Pt cooperative however declined performing OOB to recliner ("oh, that chair is for my visitors.")  Recommend SNF if ALF unable to provide current level of care.   Follow Up Recommendations  SNF     Equipment Recommendations  None recommended by PT    Recommendations for Other Services       Precautions / Restrictions Precautions Precautions: Fall;Other (comment) Precaution Comments: transfers only per Dr. Trevor Mace progress notes Restrictions Weight Bearing Restrictions: Yes Other Position/Activity Restrictions: WBAT RLE and through R elbow    Mobility  Bed Mobility Overal bed mobility: Needs Assistance Bed Mobility: Supine to Sit     Supine to sit: Min assist;HOB elevated     General bed mobility comments: utilized bed pad to assist pt with trunk, pt cued to decrease use of R UE, pt slowly able to self assist LEs onto bed  Transfers Overall transfer level: Needs assistance Equipment used: 1 person hand held assist Transfers: Sit to/from Stand Sit to Stand: Min assist         General transfer comment: pt able to slightly rise and scoot up [place], provided support at L UE, pt did not wish to mobilize more today  Ambulation/Gait                 Stairs             Wheelchair Mobility    Modified Rankin (Stroke Patients Only)       Balance                                            Cognition Arousal/Alertness:  Awake/alert Behavior During Therapy: WFL for tasks assessed/performed Overall Cognitive Status: History of cognitive impairments - at baseline                                 General Comments: follows commands with increased time, cooperative but appears more confused today compared to previous session, pt required re- orientation to hospital (thought she was at home); maintained mittens as pt attempting to manipulate objects even with them on (gown, pure wick line, telemetry box)      Exercises General Exercises - Lower Extremity Short Arc Quad: AROM;Both;Seated;10 reps    General Comments        Pertinent Vitals/Pain Pain Assessment: Faces Faces Pain Scale: Hurts a little bit Pain Location: R LE with weight bearing Pain Descriptors / Indicators: Grimacing;Guarding Pain Intervention(s): Repositioned;Monitored during session    Home Living                      Prior Function            PT Goals (current goals can now be found in the care plan section) Progress towards PT goals: Progressing toward goals    Frequency  Min 3X/week      PT Plan Current plan remains appropriate    Co-evaluation              AM-PAC PT "6 Clicks" Mobility   Outcome Measure  Help needed turning from your back to your side while in a flat bed without using bedrails?: A Little Help needed moving from lying on your back to sitting on the side of a flat bed without using bedrails?: A Little Help needed moving to and from a bed to a chair (including a wheelchair)?: A Little Help needed standing up from a chair using your arms (e.g., wheelchair or bedside chair)?: A Little Help needed to walk in hospital room?: A Lot Help needed climbing 3-5 steps with a railing? : Total 6 Click Score: 15    End of Session Equipment Utilized During Treatment: Gait belt Activity Tolerance: Patient tolerated treatment well Patient left: in bed;with call bell/phone within  reach;with bed alarm set   PT Visit Diagnosis: Difficulty in walking, not elsewhere classified (R26.2);Muscle weakness (generalized) (M62.81)     Time: 1100-1117 PT Time Calculation (min) (ACUTE ONLY): 17 min  Charges:  $Therapeutic Activity: 8-22 mins                    Paulino Door, DPT Acute Rehabilitation Services Office: [phone]  Rainer Mounce,KATHrine E 05/05/2019, 1:04 PM

## 2019-05-06 LAB — SARS CORONAVIRUS 2 (TAT 6-24 HRS): SARS Coronavirus 2: NEGATIVE

## 2019-05-06 LAB — CBC
HCT: 35 % — ABNORMAL LOW (ref 36.0–46.0)
Hemoglobin: 11.1 g/dL — ABNORMAL LOW (ref 12.0–15.0)
MCH: 29.1 pg (ref 26.0–34.0)
MCHC: 31.7 g/dL (ref 30.0–36.0)
MCV: 91.6 fL (ref 80.0–100.0)
Platelets: 271 10*3/uL (ref 150–400)
RBC: 3.82 MIL/uL — ABNORMAL LOW (ref 3.87–5.11)
RDW: 13.8 % (ref 11.5–15.5)
WBC: 10 10*3/uL (ref 4.0–10.5)
nRBC: 0 % (ref 0.0–0.2)

## 2019-05-06 NOTE — TOC Progression Note (Signed)
Transition of Care [facility]) - Progression Note    Patient Details  Name: [name] MRN: [id number] Date of Birth: Jul 12, 1922  Transition of [facility]) CM/SW Contact  Samreen Seltzer, Olegario Messier, RN Phone Number: 05/06/2019, 11:45 AM  Clinical Narrative:  Iline Oven for SNF @ [facility] from 3/9-3/11 please call with update if not d/c by 3/11.Secy will fax additional info for pasrr-progress notes 3/7,3/8, mental health status is within the progress note.     Expected Discharge Plan: Skilled Nursing Facility Barriers to Discharge: SNF Pending bed offer, Awaiting State Approval Financial controller), Insurance Authorization  Expected Discharge Plan and Services Expected Discharge Plan: Skilled Nursing Facility                                               Social Determinants of Health (SDOH) Interventions    Readmission Risk Interventions No flowsheet data found.

## 2019-05-06 NOTE — Progress Notes (Addendum)
PROGRESS NOTE    [name]    Code Status: DNR  TFT:[id number] DOB: 1923/02/18 DOA: 05/02/2019 LOS: 4 days  PCP: Soledad Gerlach, MD CC:  Chief Complaint  Patient presents with  . Fall  . Hip Pain       Hospital Summary   This is a 86 year old female with a history of advanced dementia from memory care unit, hypertension, hyperlipidemia, subdural hematoma, GERD, multiple falls in the past several months recently completed PT who presented to [facility] ED status post fall with right hip pain after she was noted to be walking to the bathroom and tripped per EMS.  Also noted to have laceration to right forearm.   Notable ED labs: K3.2, glucose 108, T bili 1.7, WBC 13.7, COVID 19 pending ED treatments: Dilaudid 0.5 mg x 1, K-Dur 40 mEq x 1 ED imaging:  Right hip x-ray: Impacted, externally rotated and slightly varus angulated transcervical fracture of right femur, diffuse bony demineralization, degenerative changes of lumbar spine and pelvis;  Right elbow x-ray: Nondisplaced intra-articular fracture involving the lateral articular surface of radial head CXR: Low lung volumes with atelectasis  3/6: S/p right hip pinning with Dr. Magnus Ivan, dressing change of right elbow.  Palliative care consulted: Recommending SNF with palliative care at discharge  A & P   Active Problems:   Closed right hip fracture (HCC)    1. Mechanical fall with resultant right hip fracture and right elbow fracture/laceration; S/p right hip pinning with Dr. Magnus Ivan (05/03/2019), recurrent falls a. Per Ortho: Limit mobility x4 weeks, can put weight on right hip and right elbow but limited mobility with chest pivoting to a wheelchair b. Full dose aspirin for VTE prophylaxis c. completed 3 days Ancef for open right elbow wound 2. Vitamin D deficiency Started supplement 3. Hypertension Continue to hold home losartan-HCTZ due to hypokalemia also med may be leading to orthostasis and multiple falls. BP stable 4. GERD Protonix  PO 5. Hypokalemia Resolved 6. Leukocytosis, likely reactive to fractures, resolved 7. Advanced Dementia a. Continue home trazodone nightly b. Delirium precautions c. Palliative care consulted: Recommending palliative care at SNF   DVT prophylaxis: Heparin Family Communication: Patient's niece at bedside has been updated yesterday Disposition Plan:   Patient came from:   ALF                                                                                          Anticipated d/c place: SNF   Barriers to d/c: Awaiting Covid testing, SNF pending bed availability  Pressure injury documentation    None  Consultants  Orthopedic surgery Palliative care  Procedures  3/6 right hip pinning with Ortho  Antibiotics   Anti-infectives (From admission, onward)   Start     Dose/Rate Route Frequency Ordered Stop   05/03/19 1015  ceFAZolin (ANCEF) IVPB 2g/100 mL premix  Status:  Discontinued     2 g 200 mL/hr over 30 Minutes Intravenous Every 6 hours 05/03/19 1003 05/03/19 1111   05/03/19 0745  ceFAZolin (ANCEF) 2-4 GM/100ML-% IVPB    Note to Pharmacy: Quinn Axe   : cabinet override  05/03/19 0745 05/03/19 2005   05/02/19 2000  ceFAZolin (ANCEF) IVPB 1 g/50 mL premix  Status:  Discontinued     1 g 100 mL/hr over 30 Minutes Intravenous Every 8 hours 05/02/19 1938 05/04/19 1510        Subjective   Patient sleeping and resting comfortably upon my arrival, arousable to verbal stimuli.  Denies any complaints and doing well.  Objective   Vitals:   05/05/19 1314 05/05/19 2205 05/06/19 0439 05/06/19 1330  BP: (!) 115/49 118/62 121/66 (!) 120/58  Pulse: 79 88 71 77  Resp: (!) 22 18 16 18   Temp: 98.3 F (36.8 C) (!) 97.4 F (36.3 C)  98.2 F (36.8 C)  TempSrc: Oral Oral  Oral  SpO2: 100% 94% 94% 94%  Weight:      Height:        Intake/Output Summary (Last 24 hours) at 05/06/2019 1510 Last data filed at 05/06/2019 0437 Gross per 24 hour  Intake --  Output 200 ml  Net  -200 ml   Filed Weights   05/02/19 1947  Weight: 55.2 kg    Examination:  Physical Exam Vitals and nursing note reviewed.  Constitutional:      Appearance: Normal appearance.  HENT:     Head: Normocephalic and atraumatic.  Eyes:     Conjunctiva/sclera: Conjunctivae normal.  Cardiovascular:     Rate and Rhythm: Normal rate and regular rhythm.  Pulmonary:     Effort: Pulmonary effort is normal.     Breath sounds: Normal breath sounds.  Abdominal:     General: Abdomen is flat.     Palpations: Abdomen is soft.  Musculoskeletal:        General: No swelling or tenderness.     Comments: Right elbow and hip wound with dressings, C/D/I  Skin:    Coloration: Skin is not jaundiced or pale.  Neurological:     Mental Status: She is alert. Mental status is at baseline.  Psychiatric:        Mood and Affect: Mood normal.        Behavior: Behavior normal.     Data Reviewed: I have personally reviewed following labs and imaging studies  CBC: Recent Labs  Lab 05/02/19 1620 05/02/19 2028 05/03/19 0508 05/04/19 0510 05/06/19 0403  WBC 13.7* 12.1* 11.2* 10.3 10.0  NEUTROABS 10.7*  --   --   --   --   HGB 14.1 13.0 12.5 10.1* 11.1*  HCT 43.1 41.1 39.2 31.8* 35.0*  MCV 90.2 90.7 91.4 90.9 91.6  PLT 301 286 254 227 271   Basic Metabolic Panel: Recent Labs  Lab 05/02/19 1620 05/02/19 1900 05/02/19 2028 05/03/19 0508 05/04/19 0510  NA 142  --   --  139 137  K 3.2*  --   --  3.5 3.5  CL 104  --   --  106 104  CO2 26  --   --  25 25  GLUCOSE 108*  --   --  105* 100*  BUN 19  --   --  16 22  CREATININE 0.89  --  0.84 0.76 0.89  CALCIUM 9.4  --   --  8.8* 8.4*  MG  --  2.1  --   --   --    GFR: Estimated Creatinine Clearance: 33.7 mL/min (by C-G formula based on SCr of 0.89 mg/dL). Liver Function Tests: Recent Labs  Lab 05/02/19 1620  AST 24  ALT 18  ALKPHOS 76  BILITOT 1.7*  PROT 7.6  ALBUMIN 4.3   No results for input(s): LIPASE, AMYLASE in the last 168  hours. No results for input(s): AMMONIA in the last 168 hours. Coagulation Profile: No results for input(s): INR, PROTIME in the last 168 hours. Cardiac Enzymes: No results for input(s): CKTOTAL, CKMB, CKMBINDEX, TROPONINI in the last 168 hours. BNP (last 3 results) No results for input(s): PROBNP in the last 8760 hours. HbA1C: No results for input(s): HGBA1C in the last 72 hours. CBG: Recent Labs  Lab 05/02/19 1444  GLUCAP 129*   Lipid Profile: No results for input(s): CHOL, HDL, LDLCALC, TRIG, CHOLHDL, LDLDIRECT in the last 72 hours. Thyroid Function Tests: No results for input(s): TSH, T4TOTAL, FREET4, T3FREE, THYROIDAB in the last 72 hours. Anemia Panel: No results for input(s): VITAMINB12, FOLATE, FERRITIN, TIBC, IRON, RETICCTPCT in the last 72 hours. Sepsis Labs: No results for input(s): PROCALCITON, LATICACIDVEN in the last 168 hours.  Recent Results (from the past 240 hour(s))  SARS CORONAVIRUS 2 (TAT 6-24 HRS) Nasopharyngeal Nasopharyngeal Swab     Status: None   Collection Time: 05/02/19  7:31 PM   Specimen: Nasopharyngeal Swab  Result Value Ref Range Status   SARS Coronavirus 2 NEGATIVE NEGATIVE Final    Comment: (NOTE) SARS-CoV-2 target nucleic acids are NOT DETECTED. The SARS-CoV-2 RNA is generally detectable in upper and lower respiratory specimens during the acute phase of infection. Negative results do not preclude SARS-CoV-2 infection, do not rule out co-infections with other pathogens, and should not be used as the sole basis for treatment or other patient management decisions. Negative results must be combined with clinical observations, patient history, and epidemiological information. The expected result is Negative. Fact Sheet for Patients: SugarRoll.be Fact Sheet for Healthcare Providers: https://www.woods-mathews.com/ This test is not yet approved or cleared by the [place] FDA and  has been authorized  for detection and/or diagnosis of SARS-CoV-2 by FDA under an Emergency Use Authorization (EUA). This EUA will remain  in effect (meaning this test can be used) for the duration of the COVID-19 declaration under Section 56 4(b)(1) of the Act, 21 U.S.C. section 360bbb-3(b)(1), unless the authorization is terminated or revoked sooner. Performed at [facility], [place] [address] [place], [place] [address]   MRSA PCR Screening     Status: None   Collection Time: 05/02/19  8:24 PM   Specimen: Nasal Mucosa; Nasopharyngeal  Result Value Ref Range Status   MRSA by PCR NEGATIVE NEGATIVE Final    Comment:        The GeneXpert MRSA Assay (FDA approved for NASAL specimens only), is one component of a comprehensive MRSA colonization surveillance program. It is not intended to diagnose MRSA infection nor to guide or monitor treatment for MRSA infections. Performed at [facility], [place] [address] [place],  [address]          Radiology Studies: No results found.      Scheduled Meds: . aspirin EC  325 mg Oral Q breakfast  . cholecalciferol  1,000 Units Oral Daily  . docusate sodium  100 mg Oral BID  . heparin  5,000 Units Subcutaneous Q8H  . mouth rinse  15 mL Mouth Rinse BID  . mupirocin ointment  1 application Nasal BID  . pantoprazole  40 mg Oral Daily  . pravastatin  10 mg Oral Daily  . traZODone  50 mg Oral QHS   Continuous Infusions: . methocarbamol (ROBAXIN) IV       Time spent: 16 minutes  with over 50% of the time coordinating the patient's care    Jae Dire, DO [facility] Pager [phone]  Call night coverage person covering after 7pm

## 2019-05-07 DIAGNOSIS — E876 Hypokalemia: Secondary | ICD-10-CM

## 2019-05-07 DIAGNOSIS — S51011A Laceration without foreign body of right elbow, initial encounter: Secondary | ICD-10-CM

## 2019-05-07 LAB — CBC
HCT: 34.8 % — ABNORMAL LOW (ref 36.0–46.0)
Hemoglobin: 10.9 g/dL — ABNORMAL LOW (ref 12.0–15.0)
MCH: 29.1 pg (ref 26.0–34.0)
MCHC: 31.3 g/dL (ref 30.0–36.0)
MCV: 93 fL (ref 80.0–100.0)
Platelets: 267 10*3/uL (ref 150–400)
RBC: 3.74 MIL/uL — ABNORMAL LOW (ref 3.87–5.11)
RDW: 14.1 % (ref 11.5–15.5)
WBC: 8.2 10*3/uL (ref 4.0–10.5)
nRBC: 0 % (ref 0.0–0.2)

## 2019-05-07 LAB — BASIC METABOLIC PANEL
Anion gap: 6 (ref 5–15)
BUN: 26 mg/dL — ABNORMAL HIGH (ref 8–23)
CO2: 26 mmol/L (ref 22–32)
Calcium: 8.6 mg/dL — ABNORMAL LOW (ref 8.9–10.3)
Chloride: 108 mmol/L (ref 98–111)
Creatinine, Ser: 0.92 mg/dL (ref 0.44–1.00)
GFR calc Af Amer: >60 mL/min (ref >60)
GFR calc non Af Amer: 53 mL/min — ABNORMAL LOW (ref >60)
Glucose, Bld: 96 mg/dL (ref 70–99)
Potassium: 4 mmol/L (ref 3.5–5.1)
Sodium: 140 mmol/L (ref 135–145)

## 2019-05-07 MED ORDER — POLYETHYLENE GLYCOL 3350 17 G PO PACK: 17.0000 g | PACK | Freq: Every day | ORAL | 0 refills | Status: DC | PRN

## 2019-05-07 MED ORDER — HYDROCODONE-ACETAMINOPHEN 5-325 MG PO TABS: 1.0000 | ORAL_TABLET | ORAL | 0 refills | Status: DC | PRN

## 2019-05-07 MED ORDER — VITAMIN D3 25 MCG PO TABS: 1000.0000 [IU] | ORAL_TABLET | Freq: Every day | ORAL | Status: DC

## 2019-05-07 MED ORDER — DOCUSATE SODIUM 100 MG PO CAPS: 100.0000 mg | ORAL_CAPSULE | Freq: Two times a day (BID) | ORAL | 0 refills | Status: DC | PRN

## 2019-05-07 MED ORDER — ASPIRIN 325 MG PO TBEC: 325.0000 mg | DELAYED_RELEASE_TABLET | Freq: Every day | ORAL | 0 refills | Status: DC

## 2019-05-07 MED ORDER — LOSARTAN POTASSIUM 50 MG PO TABS: 50.0000 mg | ORAL_TABLET | Freq: Every day | ORAL | Status: DC

## 2019-05-07 MED ORDER — METHOCARBAMOL 500 MG PO TABS: 500.0000 mg | ORAL_TABLET | Freq: Four times a day (QID) | ORAL | Status: DC | PRN

## 2019-05-07 NOTE — Plan of Care (Signed)
  Problem: Education: Goal: Knowledge of General Education information will improve Description: Including pain rating scale, medication(s)/side effects and non-pharmacologic comfort measures Outcome: Adequate for Discharge   Problem: Health Behavior/Discharge Planning: Goal: Ability to manage health-related needs will improve Outcome: Adequate for Discharge   Problem: Clinical Measurements: Goal: Ability to maintain clinical measurements within normal limits will improve Outcome: Adequate for Discharge Goal: Will remain free from infection Outcome: Adequate for Discharge Goal: Diagnostic test results will improve Outcome: Adequate for Discharge Goal: Respiratory complications will improve Outcome: Adequate for Discharge Goal: Cardiovascular complication will be avoided Outcome: Adequate for Discharge   Problem: Clinical Measurements: Goal: Ability to maintain clinical measurements within normal limits will improve Outcome: Adequate for Discharge Goal: Will remain free from infection Outcome: Adequate for Discharge Goal: Diagnostic test results will improve Outcome: Adequate for Discharge Goal: Respiratory complications will improve Outcome: Adequate for Discharge Goal: Cardiovascular complication will be avoided Outcome: Adequate for Discharge

## 2019-05-07 NOTE — Progress Notes (Signed)
Occupational Therapy Treatment Patient Details Name: [name] MRN: [id number] DOB: 1922-05-06 Today's Date: 05/07/2019    History of present illness 86 year old lady with dementia, from facility, admitted with fall and femoral neck fracture status post repair.  Also with possible radial head fracture. on 05-03-19, the patient underwent cannulated hip pinning for fractured right hip.   OT comments  Patient A & O x 1 for self only. Patient demonstrated increase in lethargy after mobilizing with physical therapist from [facility] to bed. Patient was too fatigue for recliner chair transfer and was able to complete all grooming and self-feeding tasks at bed level. Patient has progressed to setup assist for self-care tasks and Max A for bed mobility. Patient was able to initiate bridging with LE for pulling up of underwear. Recommend continued skilled acute OT services.   Follow Up Recommendations       Equipment Recommendations  None recommended by OT    Recommendations for Other Services      Precautions / Restrictions Precautions Precautions: Fall;Other (comment) Restrictions Weight Bearing Restrictions: Yes Other Position/Activity Restrictions: WBAT RLE and through R elbow       Mobility Bed Mobility Overal bed mobility: Needs Assistance Bed Mobility: Sit to Supine       Sit to supine: Max assist      Transfers                 General transfer comment: Physical therapist just transferred patient to bed from [facility] upon entering room.     Balance                                           ADL either performed or assessed with clinical judgement   ADL   Eating/Feeding: Set up;Bed level   Grooming: Brushing hair;Oral care;Wash/dry face;Wash/dry hands;Set up;Bed level                                       Vision       Perception     Praxis      Cognition Arousal/Alertness: Awake/alert Behavior During Therapy: WFL for tasks  assessed/performed Overall Cognitive Status: History of cognitive impairments - at baseline                                 General Comments: follows commands with increased time, cooperative but appears more confused today compared to previous session, pt required re- orientation to hospital (thought she was at home); maintained mittens as pt attempting to manipulate objects even with them on (gown, pure wick line, telemetry box)        Exercises     Shoulder Instructions       General Comments      Pertinent Vitals/ Pain       Pain Assessment: Faces Pain Score: 2  Faces Pain Scale: Hurts a little bit Pain Location: R LE with weight bearing  Home Living                                          Prior Functioning/Environment  Frequency           Progress Toward Goals  OT Goals(current goals can now be found in the care plan section)  Progress towards OT goals: Progressing toward goals     Plan Discharge plan remains appropriate    Co-evaluation                 AM-PAC OT "6 Clicks" Daily Activity     Outcome Measure   Help from another person eating meals?: None Help from another person taking care of personal grooming?: A Little Help from another person toileting, which includes using toliet, bedpan, or urinal?: A Lot Help from another person bathing (including washing, rinsing, drying)?: A Lot Help from another person to put on and taking off regular upper body clothing?: A Little Help from another person to put on and taking off regular lower body clothing?: A Lot 6 Click Score: 16    End of Session Equipment Utilized During Treatment: Gait belt      Activity Tolerance Patient limited by pain;Patient limited by fatigue   Patient Left in bed;with call bell/phone within reach;with bed alarm set   Nurse Communication Mobility status        Time: 9242-6834 OT Time Calculation (min): 29  min  Charges: OT General Charges $OT Visit: 1 Visit OT Treatments $Self Care/Home Management : 23-37 mins  Sherma Vanmetre OTR/L    Estevan Kersh 05/07/2019, 10:58 AM

## 2019-05-07 NOTE — Progress Notes (Signed)
Physical Therapy Treatment Patient Details Name: [name] MRN: [id number] DOB: 08-07-1922 Today's Date: 05/07/2019    History of Present Illness 86 year old lady with dementia, from facility, admitted with fall and femoral neck fracture status post repair.  Also with possible radial head fracture. on 05-03-19, the patient underwent cannulated hip pinning for fractured right hip.    PT Comments    Pt requesting to use BSC on arrival.  Pt reoriented to hospital and requiring multimodal cues for safe technique. Pt fatigued quickly and required more assist to return to bed.  Continue to recommend SNF upon d/c.   Follow Up Recommendations  SNF     Equipment Recommendations  None recommended by PT    Recommendations for Other Services       Precautions / Restrictions Precautions Precautions: Fall;Other (comment) Precaution Comments: transfers only per Dr. Trevor Mace progress notes Restrictions Weight Bearing Restrictions: Yes Other Position/Activity Restrictions: WBAT RLE and through R elbow    Mobility  Bed Mobility Overal bed mobility: Needs Assistance Bed Mobility: Supine to Sit;Sit to Supine     Supine to sit: Mod assist Sit to supine: Max assist   General bed mobility comments: utilized bed pad to assist pt with trunk, pt cued to decrease use of R UE, pt slowly able to self assist LEs onto bed, more assist required for LEs onto bed due to fatigue and pain  Transfers Overall transfer level: Needs assistance Equipment used: 1 person hand held assist Transfers: Sit to/from Stand Sit to Stand: Min assist Stand pivot transfers: Min assist;+2 physical assistance;+2 safety/equipment;Max assist       General transfer comment: multimodal cues for hand placement, pt encouraged to not use R UE however pt forgetful, assisted to/from [facility], pt able to better assist to [facility] however more difficulty with return to bed requiring max assist  Ambulation/Gait                  Stairs             Wheelchair Mobility    Modified Rankin (Stroke Patients Only)       Balance                                            Cognition Arousal/Alertness: Awake/alert Behavior During Therapy: WFL for tasks assessed/performed Overall Cognitive Status: History of cognitive impairments - at baseline                                 General Comments: follows commands with increased time, cooperative but still appears more confused today      Exercises      General Comments        Pertinent Vitals/Pain Pain Assessment: Faces Pain Score: 2  Faces Pain Scale: Hurts whole lot Pain Location: R LE with weight bearing Pain Descriptors / Indicators: Grimacing;Guarding Pain Intervention(s): Monitored during session;Repositioned    Home Living                      Prior Function            PT Goals (current goals can now be found in the care plan section) Progress towards PT goals: Progressing toward goals    Frequency    Min 3X/week      PT Plan  Current plan remains appropriate    Co-evaluation              AM-PAC PT "6 Clicks" Mobility   Outcome Measure  Help needed turning from your back to your side while in a flat bed without using bedrails?: A Little Help needed moving from lying on your back to sitting on the side of a flat bed without using bedrails?: A Lot Help needed moving to and from a bed to a chair (including a wheelchair)?: A Lot Help needed standing up from a chair using your arms (e.g., wheelchair or bedside chair)?: A Lot Help needed to walk in hospital room?: A Lot Help needed climbing 3-5 steps with a railing? : Total 6 Click Score: 12    End of Session Equipment Utilized During Treatment: Gait belt Activity Tolerance: Patient tolerated treatment well Patient left: in bed;with call bell/phone within reach;with bed alarm set   PT Visit Diagnosis: Difficulty in walking,  not elsewhere classified (R26.2);Muscle weakness (generalized) (M62.81)     Time: 9794-9971 PT Time Calculation (min) (ACUTE ONLY): 18 min  Charges:  $Therapeutic Activity: 8-22 mins                     Paulino Door, DPT Acute Rehabilitation Services Office: [phone]   Sheleen Conchas,KATHrine E 05/07/2019, 11:26 AM

## 2019-05-07 NOTE — Op Note (Signed)
NAMEDorinne, [facility] MEDICAL RECORD [id number] ACCOUNT [id number] DATE OF BIRTH:[birth year] FACILITY: [facility] LOCATION: [facility] PHYSICIAN:Lieutenant Abarca Kerry Fort, MD  OPERATIVE REPORT  DATE OF PROCEDURE:  05/03/2019  PREOPERATIVE DIAGNOSIS:  Right hip nondisplaced femoral neck fracture.  POSTOPERATIVE DIAGNOSIS:  Right hip nondisplaced femoral neck fracture.  PROCEDURE:  Cannulated screw fixation, right hip.  IMPLANTS:  Biomet-Zimmer  cannulated screws x3.  SURGEON:  Lind Guest. Ninfa Linden, MD  ASSISTANT:  Erskine Emery, PA-C  ANESTHESIA:  General.  ANTIBIOTICS:  1 gram IV Ancef.  ESTIMATED BLOOD LOSS:  Less than 50 mL.  COMPLICATIONS:  None.  INDICATIONS:  The patient is a 86 year old female who was in a memory care unit with Alzheimer disease.  She is an ambulator as well and does speak and communicate.  She does exhibit signs of pain as well.  Unfortunately, she had a mechanical fall  yesterday and has been falling more recently.  She had obvious hip pain, but no gross deformity.  She was seen in the [facility] Emergency Room and found to have a nondisplaced right hip femoral neck fracture.  I did talk to her niece who works in the  [employer] and is her healthcare power of attorney and we have recommended cannulated screw fixation to address this fracture.  The risks and benefits of surgery were explained in detail and her niece does wish for [place] to proceed with surgery given the  fact that she does ambulate.  DESCRIPTION OF PROCEDURE:  After informed consent was obtained, appropriate right hip was marked.  She was brought to the operating room.  General anesthesia was obtained while she was on a stretcher.  Foley catheter was placed and traction boot was  placed on her right foot.  She was then placed supine on the Hana fracture table with a perineal post in place and the right leg in line skeletal traction, but no traction applied.  Her left hip was flexed and abducted out of  the field and placed in a  well leg holder with appropriate padding in the popliteal area.  We then assessed her fracture under direct fluoroscopy and it was still remaining nondisplaced.  We then prepped her right hip with DuraPrep and sterile drapes.  A timeout was called.  She  was identified as correct patient, correct right hip.  We then made an incision over the lateral aspect of the femur and carried this proximally and distally.  We dissected down to between the greater trochanter and lesser trochanter and identified these  points under direct fluoroscopy.  We then placed 3 temporary guide pins in an inverted triangle format with the inferior screw in a center position and then the 2 superior screws and 1 in an anterior and 1 in a posterior position.  We then took  measurements off of this and placed our 3 cannulated screws.  The inferior screw was a size 80 partially threaded ____ screw and the 2 superior screws were ____ mm screws.  All 3 screws had nice bites in the cortex.  We then removed the 3 guide pins.   Under direct fluoroscopy, we put her hip through internal and external rotation and it moved as a unit and we were pleased with the fracture reduction and placement of the screws.  We then irrigated the wound with normal saline solution.  We closed deep  tissue with 0 Vicryl, followed by 2-0 Vicryl in subcutaneous tissue and interrupted staples on the skin.  Xeroform and Aquacel dressing were  applied.  Of note, she does have a wound over her elbow, which we cleaned and closed with staples and placed  dressing on this.  She was awakened, extubated, and taken to recovery room in stable condition.  All final counts were correct.  No complications noted.  Postoperatively, we are going to actually let her attempt weightbearing as tolerated on her right  upper and right lower extremity due to her advanced dementia.  Of note, Rexene Edison, PA-C did assist during the entire case.  His assistance was  helpful in facilitating all aspects of this case.     JN/NUANCE  D:05/03/2019 T:05/04/2019 JOB:[id number]

## 2019-05-07 NOTE — TOC Progression Note (Signed)
Transition of Care [facility]) - Progression Note    Patient Details  Name: [name] MRN: [id number] Date of Birth: Apr 11, 1922  [facility]) CM/SW Contact  Geni Bers, RN Phone Number: 05/07/2019, 11:37 AM  Clinical Narrative:    Niece Galen Daft was called to inform her that pt would be discharged. RN is also aware that Sh[facility] Mons has been called.   Expected Discharge Plan: Skilled Nursing Facility Barriers to Discharge: SNF Pending bed offer, Awaiting State Approval [facility]), Insurance Authorization  Expected Discharge Plan and Services Expected Discharge Plan: Skilled Nursing Facility         Expected Discharge Date: 05/07/19                                     Social Determinants of Health (SDOH) Interventions    Readmission Risk Interventions No flowsheet data found.

## 2019-05-07 NOTE — Progress Notes (Signed)
Pt Handoff Report called to [facility], [phone] spoke with [facility], LPN. Questions addressed and answered, SRP, RN

## 2019-05-07 NOTE — Progress Notes (Signed)
Spoke with Kym Groom niece and POA to notify her pt is left to [facility].

## 2019-05-07 NOTE — TOC Progression Note (Signed)
Transition of Care [facility]) - Progression Note    Patient Details  Name: [name] MRN: [id number] Date of Birth: March 23, 1922  [facility]) CM/SW Contact  Geni Bers, RN Phone Number: 05/07/2019, 11:30 AM  Clinical Narrative:    Transportation with [facility] called.    Expected Discharge Plan: Skilled Nursing Facility Barriers to Discharge: SNF Pending bed offer, Awaiting State Approval [facility]), Insurance Authorization  Expected Discharge Plan and Services Expected Discharge Plan: Skilled Nursing Facility         Expected Discharge Date: 05/07/19                                     Social Determinants of Health (SDOH) Interventions    Readmission Risk Interventions No flowsheet data found.

## 2019-05-07 NOTE — Discharge Summary (Signed)
Physician Discharge Summary  [name] XAJ:[id number] DOB: 11/13/1922 DOA: 05/02/2019  PCP: Soledad Gerlach, MD  Admit date: 05/02/2019 Discharge date: 05/07/2019  Admitted From:  ALF  Discharge disposition: SNF   Recommendations for Outpatient Follow-Up:   . Follow up with your primary care provider at the skilled nursing facility in 3 to 5 days . Check CBC, BMP in the next visit . Limit mobility x4 weeks, can put weight on right hip and right elbow but limited mobility with chest pivoting to a wheelchair . Follow-up with orthopedics Dr Magnus Ivan in 2 weeks. . Continue aspirin for DVT prophylaxis for total of 30 days. . Patient would benefit from palliative care follow-up at the skilled nursing facility. . Losartan HCTZ has been discontinued and patient has been put on losartan.   Discharge Diagnosis:   Active Problems:   Closed right hip fracture [facility])   Discharge Condition: Improved.  Diet recommendation: Low sodium, heart healthy.    Code status: DNR   History of Present Illness:   This is a 86 year old female with a history of advanced dementia from memory care unit, hypertension, hyperlipidemia,subdural hematoma,GERD, multiple falls in the past  presented to the hospital status post fall with right hip pain and difficulty ambulating.  She did have laceration to her right forearm as well.  Initial labs showed hypokalemia.  Patient did have a right hip x-ray which showed Impacted, externally rotated and slightly varus angulated transcervical fracture of right femur, diffuse bony demineralization, degenerative changes of lumbar spine and pelvis;  Right elbow x-ray: Nondisplaced intra-articular fracture involving the lateral articular surface of radial head.  Patient underwent right hip pinning with Dr. Magnus Ivan on 3/6, dressing change of right elbow.  Palliative care was consulted who recommended SNF with palliative care at discharge.   Hospital Course:   Following  conditions were addressed during hospitalization as listed below,  Mechanical fall with resultant right hip fracture and right elbow fracture/laceration; S/p right hip pinning with Dr. Magnus Ivan (05/03/2019), recurrent falls. Orthopedics has recommended the following:  limit mobility x4 weeks, can put weight on right hip and right elbow but limited mobility with chest pivoting to a wheelchair.  We will continue full dose aspirin for VTE prophylaxis.  Patient received 3 days of Ancef for open right elbow wound.  Vitamin D deficiency we will continue vitamin D on discharge.  Essential hypertension  Home losartan-HCTZ was kept on  hold during hospitalization due to due to hypokalemia and orthostatis and falls.  Could consider low-dose losartan on discharge.  Hypokalemia Resolved  Leukocytosis, likely reactive to fractures, resolved  Advanced Dementia on trazodone nightly, continue delirium precautions,recommending palliative care at SNF  Disposition.  At this time, patient is stable for disposition to skilled nursing facility.  Medical Consultants:    Orthopedics  Procedures:    Right hip pinning with orthopedics on 3/ 6 Subjective:   Today, patient denies interval complaints.  Has advanced dementia.  Denies overt pain.  Discharge Exam:   Vitals:   05/06/19 2145 05/07/19 0634  BP: 127/70 139/70  Pulse: 76 78  Resp: 18 18  Temp: (!) 97.4 F (36.3 C) 97.6 F (36.4 C)  SpO2: 92% 94%   Vitals:   05/06/19 0439 05/06/19 1330 05/06/19 2145 05/07/19 0634  BP: 121/66 (!) 120/58 127/70 139/70  Pulse: 71 77 76 78  Resp: Temp:  98.2 F (36.8 C) (!) 97.4 F (36.3 C) 97.6 F (36.4 C)  TempSrc:  Oral    SpO2: 94% 94% 92% 94%  Weight:      Height:       General: Alert awake, not in obvious distress, has advanced dementia. HENT: pupils equally reacting to light,  No scleral pallor or icterus noted. Oral mucosa is moist.  Chest:  Clear breath sounds.  Diminished breath  sounds bilaterally. No crackles or wheezes.  CVS: S1 &S2 heard. No murmur.  Regular rate and rhythm. Abdomen: Soft, nontender, nondistended.  Bowel sounds are heard.   Extremities: No cyanosis, clubbing or edema.  Peripheral pulses are palpable.  Right elbow with dressing.  Right hip with dressing. Psych: Alert, awake and oriented, normal mood CNS:  No cranial nerve deficits.  Power equal in all extremities.   Skin: Warm and dry.  No rashes noted.  The results of significant diagnostics from this hospitalization (including imaging, microbiology, ancillary and laboratory) are listed below for reference.     Diagnostic Studies:   DG Chest 1 View  Result Date: 05/02/2019 CLINICAL DATA:  Fall EXAM: CHEST  1 VIEW COMPARISON:  None. FINDINGS: Low lung volumes with atelectatic changes. Some hazy interstitial changes and indistinctness of the vascularity is noted as well. Cardiac prominence is likely related to portable technique and low volumes. The aorta is calcified. The remaining cardiomediastinal contours are unremarkable. No acute osseous or soft tissue abnormality. Cardiac monitoring leads overlie chest. IMPRESSION: Low lung volumes with atelectatic changes. Hazy interstitial changes and indistinctness of the vascularity could suggest mild edema well. Aortic Atherosclerosis (ICD10-I70.0). Electronically Signed   By: Kreg Shropshire M.D.   On: 05/02/2019 16:19   DG Elbow Complete Right  Result Date: 05/02/2019 CLINICAL DATA:  Fall EXAM: RIGHT ELBOW - COMPLETE 3+ VIEW COMPARISON:  None. FINDINGS: Nondisplaced intra-articular fracture involving the lateral articular surface of the radial head. No significant joint effusion. No other acute fracture or traumatic malalignment. Mild degenerative changes the elbow. Remaining soft tissues are unremarkable. IMPRESSION: Nondisplaced intra-articular fracture involving the lateral articular surface of the radial head. Electronically Signed   By: Kreg Shropshire M.D.    On: 05/02/2019 16:15   CT Head Wo Contrast  Result Date: 05/02/2019 CLINICAL DATA:  Fall EXAM: CT HEAD WITHOUT CONTRAST CT CERVICAL SPINE WITHOUT CONTRAST TECHNIQUE: Multidetector CT imaging of the head and cervical spine was performed following the standard protocol without intravenous contrast. Multiplanar CT image reconstructions of the cervical spine were also generated. COMPARISON:  CT head 03/22/2019, CT head cervical spine and maxillofacial 03/21/2019 FINDINGS: CT HEAD FINDINGS Brain: Interval resolution of a previously seen subdural hematomas along the left frontal convexity and falx seen on comparison studies. Stable partially calcified 8 mm sub frontal mass, compatible with meningioma. No associated edema or significant mass effect. No evidence of acute infarction, hemorrhage, hydrocephalus, new extra-axial collection or mass lesion/mass effect. Symmetric prominence of the ventricles, cisterns and sulci compatible with frontal predominant parenchymal volume loss. Patchy and con areas of white matter hypoattenuation are most compatible with moderate chronic microvascular angiopathy. Vascular: Atherosclerotic calcification of the carotid siphons. No hyperdense vessel. Skull: No calvarial fracture or suspicious osseous lesion. No scalp swelling or hematoma. Sinuses/Orbits: Paranasal sinuses and mastoid air cells are predominantly clear. Included orbital structures are unremarkable. Other: None CT CERVICAL SPINE FINDINGS Alignment: Straightening of the normal cervical lordosis without traumatic listhesis. Appearance is similar to prior. No abnormally widened, jumped or perched facets. Normal alignment of the craniocervical and atlantoaxial articulations accounting for rightward head rotation. Skull base and vertebrae: Streak  artifact from dental amalgam and patient's metallic necklace may limit detection of subtle nondisplaced fractures. No acute fracture. No primary bone lesion or focal pathologic process.  Soft tissues and spinal canal: No pre or paravertebral fluid or swelling. No visible canal hematoma. Disc levels: Multilevel intervertebral disc height loss with spondylitic endplate changes, facet hypertrophy and uncinate spurring. Disc osteophyte complexes are most pronounced at C5-6 and C6-7 resulting in mild canal stenosis. No severe canal stenosis or foraminal narrowing. Upper chest: No acute abnormality in the upper chest or imaged lung apices. Other: Atherosclerotic calcification of the thoracic aortic arch and left common carotid. Thyroid is largely obscured by streak artifact. IMPRESSION: 1. No acute intracranial abnormality. No scalp swelling or calvarial fracture. 2. Interval resolution of previously seen left frontal convexity and parafalcine subdural hematomas. 3. Chronic frontal predominant parenchymal volume loss and moderate microvascular angiopathy. 4. No acute cervical spine fracture or traumatic listhesis. 5. Multilevel cervical spondylitic changes, as detailed above. 6. Aortic Atherosclerosis (ICD10-I70.0). Electronically Signed   By: Kreg ShropshirePrice  DeHay M.D.   On: 05/02/2019 18:12   CT Cervical Spine Wo Contrast  Result Date: 05/02/2019 CLINICAL DATA:  Fall EXAM: CT HEAD WITHOUT CONTRAST CT CERVICAL SPINE WITHOUT CONTRAST TECHNIQUE: Multidetector CT imaging of the head and cervical spine was performed following the standard protocol without intravenous contrast. Multiplanar CT image reconstructions of the cervical spine were also generated. COMPARISON:  CT head 03/22/2019, CT head cervical spine and maxillofacial 03/21/2019 FINDINGS: CT HEAD FINDINGS Brain: Interval resolution of a previously seen subdural hematomas along the left frontal convexity and falx seen on comparison studies. Stable partially calcified 8 mm sub frontal mass, compatible with meningioma. No associated edema or significant mass effect. No evidence of acute infarction, hemorrhage, hydrocephalus, new extra-axial collection or  mass lesion/mass effect. Symmetric prominence of the ventricles, cisterns and sulci compatible with frontal predominant parenchymal volume loss. Patchy and con areas of white matter hypoattenuation are most compatible with moderate chronic microvascular angiopathy. Vascular: Atherosclerotic calcification of the carotid siphons. No hyperdense vessel. Skull: No calvarial fracture or suspicious osseous lesion. No scalp swelling or hematoma. Sinuses/Orbits: Paranasal sinuses and mastoid air cells are predominantly clear. Included orbital structures are unremarkable. Other: None CT CERVICAL SPINE FINDINGS Alignment: Straightening of the normal cervical lordosis without traumatic listhesis. Appearance is similar to prior. No abnormally widened, jumped or perched facets. Normal alignment of the craniocervical and atlantoaxial articulations accounting for rightward head rotation. Skull base and vertebrae: Streak artifact from dental amalgam and patient's metallic necklace may limit detection of subtle nondisplaced fractures. No acute fracture. No primary bone lesion or focal pathologic process. Soft tissues and spinal canal: No pre or paravertebral fluid or swelling. No visible canal hematoma. Disc levels: Multilevel intervertebral disc height loss with spondylitic endplate changes, facet hypertrophy and uncinate spurring. Disc osteophyte complexes are most pronounced at C5-6 and C6-7 resulting in mild canal stenosis. No severe canal stenosis or foraminal narrowing. Upper chest: No acute abnormality in the upper chest or imaged lung apices. Other: Atherosclerotic calcification of the thoracic aortic arch and left common carotid. Thyroid is largely obscured by streak artifact. IMPRESSION: 1. No acute intracranial abnormality. No scalp swelling or calvarial fracture. 2. Interval resolution of previously seen left frontal convexity and parafalcine subdural hematomas. 3. Chronic frontal predominant parenchymal volume loss and  moderate microvascular angiopathy. 4. No acute cervical spine fracture or traumatic listhesis. 5. Multilevel cervical spondylitic changes, as detailed above. 6. Aortic Atherosclerosis (ICD10-I70.0). Electronically Signed   By: Samuella CotaPrice  [facility] M.D.   On: 05/02/2019 18:12   DG C-Arm 1-60 Min-No Report  Result Date: 05/03/2019 Fluoroscopy was utilized by the requesting physician.  No radiographic interpretation.   DG HIP OPERATIVE UNILAT W OR W/O PELVIS RIGHT  Result Date: 05/03/2019 CLINICAL DATA:  Right hip fracture. EXAM: OPERATIVE RIGHT HIP (WITH PELVIS IF PERFORMED) 2 VIEWS TECHNIQUE: Fluoroscopic spot image(s) were submitted for interpretation post-operatively. COMPARISON:  05/02/2019 FINDINGS: Internal fixation of the right hip fracture with 3 screws extending through the femoral head and neck. Right hip is located. IMPRESSION: Internal fixation of the right hip fracture Electronically Signed   By: Markus Daft M.D.   On: 05/03/2019 11:00   DG Hip Unilat W or Wo Pelvis 2-3 Views Right  Result Date: 05/02/2019 CLINICAL DATA:  Fall EXAM: DG HIP (WITH OR WITHOUT PELVIS) 2-3V RIGHT COMPARISON:  None. FINDINGS: The osseous structures appear diffusely demineralized which may limit detection of small or nondisplaced fractures. Impacted, externally rotated and slightly varus angulated transcervical fracture of the right femur. Femoral head remains normally located within the acetabulum albeit with some notable a sphericity of the right femoral head which is asymmetric to the contralateral side. The left femur is intact. Remaining bones of the pelvis are intact and congruent with degenerative changes in the lower lumbar spine SI joints and symphysis pubis. These a pathic changes noted upon the iliac crests and greater trochanters bilaterally. Vascular calcium noted in the medial thighs. IMPRESSION: 1. Impacted, externally rotated and slightly varus angulated transcervical fracture of the right femur. 2. Diffuse bony  demineralization. 3. Degenerative changes in the lower lumbar spine, SI joints, and symphysis pubis. Electronically Signed   By: Lovena Le M.D.   On: 05/02/2019 16:14     Labs:   Basic Metabolic Panel: Recent Labs  Lab 05/02/19 1620 05/02/19 1620 05/02/19 1900 05/02/19 2028 05/03/19 0508 05/03/19 0508 05/04/19 0510 05/07/19 0435  NA 142  --   --   --  139  --  137 140  K 3.2*   < >  --   --  3.5   < > 3.5 4.0  CL 104  --   --   --  106  --  104 108  CO2 26  --   --   --  25  --  25 26  GLUCOSE 108*  --   --   --  105*  --  100* 96  BUN 19  --   --   --  16  --  22 26*  CREATININE 0.89  --   --  0.84 0.76  --  0.89 0.92  CALCIUM 9.4  --   --   --  8.8*  --  8.4* 8.6*  MG  --   --  2.1  --   --   --   --   --    < > = values in this interval not displayed.   GFR Estimated Creatinine Clearance: 32.6 mL/min (by C-G formula based on SCr of 0.92 mg/dL). Liver Function Tests: Recent Labs  Lab 05/02/19 1620  AST 24  ALT 18  ALKPHOS 76  BILITOT 1.7*  PROT 7.6  ALBUMIN 4.3   No results for input(s): LIPASE, AMYLASE in the last 168 hours. No results for input(s): AMMONIA in the last 168 hours. Coagulation profile No results for input(s): INR, PROTIME in the last 168 hours.  CBC: Recent Labs  Lab 05/02/19 1620 05/02/19 1620 05/02/19 2028 05/03/19 9381  05/04/19 0510 05/06/19 0403 05/07/19 0435  WBC 13.7*   < > 12.1* 11.2* 10.3 10.0 8.2  NEUTROABS 10.7*  --   --   --   --   --   --   HGB 14.1   < > 13.0 12.5 10.1* 11.1* 10.9*  HCT 43.1   < > 41.1 39.2 31.8* 35.0* 34.8*  MCV 90.2   < > 90.7 91.4 90.9 91.6 93.0  PLT 301   < > 286 254 227 271 267   < > = values in this interval not displayed.   Cardiac Enzymes: No results for input(s): CKTOTAL, CKMB, CKMBINDEX, TROPONINI in the last 168 hours. BNP: Invalid input(s): POCBNP CBG: Recent Labs  Lab 05/02/19 1444  GLUCAP 129*   D-Dimer No results for input(s): DDIMER in the last 72 hours. Hgb A1c No results  for input(s): HGBA1C in the last 72 hours. Lipid Profile No results for input(s): CHOL, HDL, LDLCALC, TRIG, CHOLHDL, LDLDIRECT in the last 72 hours. Thyroid function studies No results for input(s): TSH, T4TOTAL, T3FREE, THYROIDAB in the last 72 hours.  Invalid input(s): FREET3 Anemia work up No results for input(s): VITAMINB12, FOLATE, FERRITIN, TIBC, IRON, RETICCTPCT in the last 72 hours. Microbiology Recent Results (from the past 240 hour(s))  SARS CORONAVIRUS 2 (TAT 6-24 HRS) Nasopharyngeal Nasopharyngeal Swab     Status: None   Collection Time: 05/02/19  7:31 PM   Specimen: Nasopharyngeal Swab  Result Value Ref Range Status   SARS Coronavirus 2 NEGATIVE NEGATIVE Final    Comment: (NOTE) SARS-CoV-2 target nucleic acids are NOT DETECTED. The SARS-CoV-2 RNA is generally detectable in upper and lower respiratory specimens during the acute phase of infection. Negative results do not preclude SARS-CoV-2 infection, do not rule out co-infections with other pathogens, and should not be used as the sole basis for treatment or other patient management decisions. Negative results must be combined with clinical observations, patient history, and epidemiological information. The expected result is Negative. Fact Sheet for Patients: HairSlick.no Fact Sheet for Healthcare Providers: quierodirigir.com This test is not yet approved or cleared by the [place] FDA and  has been authorized for detection and/or diagnosis of SARS-CoV-2 by FDA under an Emergency Use Authorization (EUA). This EUA will remain  in effect (meaning this test can be used) for the duration of the COVID-19 declaration under Section 56 4(b)(1) of the Act, 21 U.S.C. section 360bbb-3(b)(1), unless the authorization is terminated or revoked sooner. Performed at [facility], [address], [place], [place] [address]   MRSA PCR Screening     Status: None    Collection Time: 05/02/19  8:24 PM   Specimen: Nasal Mucosa; Nasopharyngeal  Result Value Ref Range Status   MRSA by PCR NEGATIVE NEGATIVE Final    Comment:        The GeneXpert MRSA Assay (FDA approved for NASAL specimens only), is one component of a comprehensive MRSA colonization surveillance program. It is not intended to diagnose MRSA infection nor to guide or monitor treatment for MRSA infections. Performed at [facility], [address], [place], [place] [address]   SARS CORONAVIRUS 2 (TAT 6-24 HRS) Nasopharyngeal Nasopharyngeal Swab     Status: None   Collection Time: 05/06/19  9:25 AM   Specimen: Nasopharyngeal Swab  Result Value Ref Range Status   SARS Coronavirus 2 NEGATIVE NEGATIVE Final    Comment: (NOTE) SARS-CoV-2 target nucleic acids are NOT DETECTED. The SARS-CoV-2 RNA is generally detectable in upper  and lower respiratory specimens during the acute phase of infection. Negative results do not preclude SARS-CoV-2 infection, do not rule out co-infections with other pathogens, and should not be used as the sole basis for treatment or other patient management decisions. Negative results must be combined with clinical observations, patient history, and epidemiological information. The expected result is Negative. Fact Sheet for Patients: HairSlick.no Fact Sheet for Healthcare Providers: quierodirigir.com This test is not yet approved or cleared by the [place] FDA and  has been authorized for detection and/or diagnosis of SARS-CoV-2 by FDA under an Emergency Use Authorization (EUA). This EUA will remain  in effect (meaning this test can be used) for the duration of the COVID-19 declaration under Section 56 4(b)(1) of the Act, 21 U.S.C. section 360bbb-3(b)(1), unless the authorization is terminated or revoked sooner. Performed at [facility], [address], [place],  [place] [address]      Discharge Instructions:   Discharge Instructions    Diet - low sodium heart healthy   Complete by: As directed    Discharge instructions   Complete by: As directed    Follow-up with your primary care provider at the skilled nursing facility in 3 to 5 days.  Check blood work at that time.  Follow-up with orthopedics in 2 weeks.   Increase activity slowly   Complete by: As directed      Allergies as of 05/07/2019      Reactions   Penicillins Other (See Comments)   Unknown Tolerates Keflex      Medication List    STOP taking these medications   guaiFENesin-codeine 100-10 MG/5ML syrup Commonly known as: ROBITUSSIN AC   losartan-hydrochlorothiazide 100-25 MG tablet Commonly known as: HYZAAR     TAKE these medications   acetaminophen 325 MG tablet Commonly known as: TYLENOL Take 650 mg by mouth every 6 (six) hours as needed for mild pain or headache.   aspirin 325 MG EC tablet Take 1 tablet (325 mg total) by mouth daily with breakfast.   docusate sodium 100 MG capsule Commonly known as: COLACE Take 1 capsule (100 mg total) by mouth 2 (two) times daily as needed for mild constipation.   HYDROcodone-acetaminophen 5-325 MG tablet Commonly known as: NORCO/VICODIN Take 1 tablet by mouth every 4 (four) hours as needed for moderate pain or severe pain.   losartan 50 MG tablet Commonly known as: Cozaar Take 1 tablet (50 mg total) by mouth daily.   Melatonin 5 MG Tabs Take 5 mg by mouth at bedtime as needed (sleep).   methocarbamol 500 MG tablet Commonly known as: ROBAXIN Take 1 tablet (500 mg total) by mouth every 6 (six) hours as needed for muscle spasms.   omeprazole 20 MG capsule Commonly known as: PRILOSEC Take 20 mg by mouth every other day.   polyethylene glycol 17 g packet Commonly known as: MIRALAX / GLYCOLAX Take 17 g by mouth daily as needed for moderate constipation.   pravastatin 10 MG tablet Commonly known as: PRAVACHOL Take 10 mg by  mouth daily.   traZODone 50 MG tablet Commonly known as: DESYREL Take 50 mg by mouth at bedtime.   Vitamin D3 25 MCG tablet Commonly known as: Vitamin D Take 1 tablet (1,000 Units total) by mouth daily.          Contact information for follow-up providers    Kathryne Hitch, MD. Schedule an appointment as soon as possible for a visit in 2 week(s).   Specialty: Orthopedic  Surgery Contact information: [address] [place] [address] [phone]            Contact information for after-discharge care    Destination    [facility]erred [facility] .   Service: Skilled Nursing Contact information: [address] [place] [address] [phone]                   Time coordinating discharge: 39 minutes  Signed:  Melo Stauber  [facility] 05/07/2019, 10:46 AM

## 2019-05-08 ENCOUNTER — Emergency Department (HOSPITAL_COMMUNITY)
Admission: EM | Admit: 2019-05-08 | Discharge: 2019-05-08 | Disposition: A | Discharge status: Home / Self Care | Payer: Medicare Other | Attending: Emergency Medicine | Admitting: Emergency Medicine

## 2019-05-08 ENCOUNTER — Emergency Department (HOSPITAL_COMMUNITY): Payer: Medicare Other

## 2019-05-08 ENCOUNTER — Other Ambulatory Visit: Payer: Self-pay

## 2019-05-08 ENCOUNTER — Encounter (HOSPITAL_COMMUNITY): Payer: Self-pay | Admitting: Emergency Medicine

## 2019-05-08 DIAGNOSIS — Y92122 Bedroom in nursing home as the place of occurrence of the external cause: Secondary | ICD-10-CM | POA: Diagnosis not present

## 2019-05-08 DIAGNOSIS — G309 Alzheimer's disease, unspecified: Secondary | ICD-10-CM | POA: Diagnosis not present

## 2019-05-08 DIAGNOSIS — S0181XA Laceration without foreign body of other part of head, initial encounter: Secondary | ICD-10-CM

## 2019-05-08 DIAGNOSIS — Z87891 Personal history of nicotine dependence: Secondary | ICD-10-CM | POA: Insufficient documentation

## 2019-05-08 DIAGNOSIS — W06XXXA Fall from bed, initial encounter: Secondary | ICD-10-CM | POA: Diagnosis not present

## 2019-05-08 DIAGNOSIS — S01111A Laceration without foreign body of right eyelid and periocular area, initial encounter: Secondary | ICD-10-CM | POA: Insufficient documentation

## 2019-05-08 DIAGNOSIS — Y999 Unspecified external cause status: Secondary | ICD-10-CM | POA: Diagnosis not present

## 2019-05-08 DIAGNOSIS — I10 Essential (primary) hypertension: Secondary | ICD-10-CM | POA: Insufficient documentation

## 2019-05-08 DIAGNOSIS — W19XXXA Unspecified fall, initial encounter: Secondary | ICD-10-CM

## 2019-05-08 DIAGNOSIS — Z79899 Other long term (current) drug therapy: Secondary | ICD-10-CM | POA: Insufficient documentation

## 2019-05-08 DIAGNOSIS — Y939 Activity, unspecified: Secondary | ICD-10-CM | POA: Insufficient documentation

## 2019-05-08 DIAGNOSIS — I611 Nontraumatic intracerebral hemorrhage in hemisphere, cortical: Secondary | ICD-10-CM | POA: Insufficient documentation

## 2019-05-08 MED ORDER — LIDOCAINE-EPINEPHRINE (PF) 2 %-1:200000 IJ SOLN
10.0000 mL | Freq: Once | INTRAMUSCULAR | Status: AC
  Administered 2019-05-08: 10 mL
  Filled 2019-05-08: qty 10

## 2019-05-08 NOTE — ED Provider Notes (Signed)
[facility]-EMERGENCY DEPT Provider Note   CSN: [id number] Arrival date & time: 05/08/19  7741     History Chief Complaint  Patient presents with  . Fall    [name] is a 86 y.o. female.  Pt presents to the ED today with a fall.  She arrived to [facility] yesterday after having a hip fx repair.  The pt tried to get out of bed and fell hitting her head.  She did sustain a lac to her right forehead.  The pt has dementia and is unable to give any hx.  Fall was not witnessed.  Pt is at [facility] baseline per EMS.        Past Medical History:  Diagnosis Date  . Alzheimer disease (HCC)   . Dementia (HCC)   . Essential hypertension   . Hyperlipidemia     Patient Active Problem List   Diagnosis Date Noted  . Closed right hip fracture (HCC) 05/02/2019  . Subdural hematoma (HCC) 03/21/2019  . Essential hypertension 03/21/2019  . Dementia without behavioral disturbance (HCC) 03/21/2019  . Hyperlipidemia 03/21/2019  . Hypokalemia 03/21/2019    Past Surgical History:  Procedure Laterality Date  . HIP PINNING,CANNULATED Right 05/03/2019   Procedure: CANNULATED HIP PINNING,dressing change right elbow;  Surgeon: Kathryne Hitch, MD;  Location: [facility];  Service: Orthopedics;  Laterality: Right;     OB History   No obstetric history on file.     History reviewed. No pertinent family history.  Social History   Tobacco Use  . Smoking status: Former Smoker    Packs/day: 0.25    Types: Cigarettes  . Smokeless tobacco: Never Used  . Tobacco comment: smoked socially  Substance Use Topics  . Alcohol use: Never  . Drug use: Never    Home Medications Prior to Admission medications   Medication Sig Start Date End Date Taking? Authorizing Provider  acetaminophen (TYLENOL) 325 MG tablet Take 650 mg by mouth every 6 (six) hours as needed for mild pain or headache.   Yes [provider]  aspirin EC 325 MG EC tablet Take 1 tablet (325 mg  total) by mouth daily with breakfast. 05/07/19  Yes Pokhrel, Laxman, MD  cholecalciferol (VITAMIN D) 25 MCG tablet Take 1 tablet (1,000 Units total) by mouth daily. 05/07/19  Yes Pokhrel, Laxman, MD  docusate sodium (COLACE) 100 MG capsule Take 1 capsule (100 mg total) by mouth 2 (two) times daily as needed for mild constipation. 05/07/19  Yes Pokhrel, Laxman, MD  HYDROcodone-acetaminophen (NORCO/VICODIN) 5-325 MG tablet Take 1 tablet by mouth every 4 (four) hours as needed for moderate pain or severe pain. 05/07/19  Yes Pokhrel, Laxman, MD  losartan (COZAAR) 50 MG tablet Take 1 tablet (50 mg total) by mouth daily. 05/07/19 05/06/20 Yes Pokhrel, Laxman, MD  Melatonin 5 MG TABS Take 5 mg by mouth at bedtime as needed (sleep).   Yes [provider]  methocarbamol (ROBAXIN) 500 MG tablet Take 1 tablet (500 mg total) by mouth every 6 (six) hours as needed for muscle spasms. 05/07/19  Yes Pokhrel, Laxman, MD  omeprazole (PRILOSEC) 20 MG capsule Take 20 mg by mouth every other day. 03/05/19  Yes [provider]  polyethylene glycol (MIRALAX / GLYCOLAX) 17 g packet Take 17 g by mouth daily as needed for moderate constipation. 05/07/19  Yes Pokhrel, Laxman, MD  pravastatin (PRAVACHOL) 10 MG tablet Take 10 mg by mouth daily.  03/05/19  Yes [provider]  traZODone (DESYREL) 50 MG tablet Take 50 mg by mouth at bedtime. 03/05/19  Yes [provider]  tuberculin 5 UNIT/0.1ML injection Inject 5 Units into the skin once.    [provider]    Allergies    Penicillins  Review of Systems   Review of Systems  Skin: Positive for wound.  All other systems reviewed and are negative.   Physical Exam Updated Vital Signs BP 134/69   Pulse 80   Temp (!) 97.5 F (36.4 C) (Oral)   Resp 16   SpO2 94%   Physical Exam Vitals and nursing note reviewed.  HENT:     Head: Normocephalic.      Right Ear: External ear normal.     Left Ear: External ear normal.     Nose: Nose  normal.     Mouth/Throat:     Mouth: Mucous membranes are moist.     Pharynx: Oropharynx is clear.  Eyes:     Extraocular Movements: Extraocular movements intact.     Conjunctiva/sclera: Conjunctivae normal.     Pupils: Pupils are equal, round, and reactive to light.  Cardiovascular:     Rate and Rhythm: Normal rate and regular rhythm.     Pulses: Normal pulses.     Heart sounds: Normal heart sounds.  Pulmonary:     Effort: Pulmonary effort is normal.     Breath sounds: Normal breath sounds.  Abdominal:     General: Abdomen is flat. Bowel sounds are normal.     Palpations: Abdomen is soft.  Musculoskeletal:        General: Normal range of motion.     Cervical back: Normal range of motion and neck supple.  Skin:    General: Skin is warm.     Capillary Refill: Capillary refill takes less than 2 seconds.  Neurological:     General: No focal deficit present.     Mental Status: She is alert. Mental status is at baseline.  Psychiatric:        Mood and Affect: Mood normal.        Behavior: Behavior normal.        Thought Content: Thought content normal.        Judgment: Judgment normal.     ED Results / Procedures / Treatments   Labs (all labs ordered are listed, but only abnormal results are displayed) Labs Reviewed - No data to display  EKG None  Radiology DG Chest 2 View  Result Date: 05/08/2019 CLINICAL DATA:  Fall EXAM: CHEST - 2 VIEW COMPARISON:  05/02/2019 FINDINGS: Normal heart size, with improved appearance when compared with prior due to better inflation. There is no edema, consolidation, effusion, or pneumothorax. No acute osseous finding. IMPRESSION: No evidence of active disease. Improved appearance of the chest compared to study last week. Electronically Signed   By: Marnee Spring M.D.   On: 05/08/2019 10:00   DG Pelvis 1-2 Views  Result Date: 05/08/2019 CLINICAL DATA:  Fall. EXAM: PELVIS - 1-2 VIEW COMPARISON:  May 03, 2019. FINDINGS: Status post surgical  internal fixation of old proximal right femoral fracture. No new fracture or dislocation is noted. Left hip is unremarkable. IMPRESSION: No acute abnormality seen in the pelvis. Electronically Signed   By: Lupita Raider M.D.   On: 05/08/2019 09:59   CT Head Wo Contrast  Result Date: 05/08/2019 CLINICAL DATA:  Status post fall today. EXAM: CT HEAD WITHOUT CONTRAST CT CERVICAL SPINE WITHOUT CONTRAST TECHNIQUE: Multidetector CT imaging  of the head and cervical spine was performed following the standard protocol without intravenous contrast. Multiplanar CT image reconstructions of the cervical spine were also generated. COMPARISON:  May 02, 2019 FINDINGS: CT HEAD FINDINGS Brain: There is acute hemorrhage of bilateral medial frontal lobes. Adjacent subarachnoid hemorrhage is not excluded. There is no midline shift or hydrocephalus. Chronic diffuse atrophy is noted. Chronic bilateral periventricular white matter small vessel ischemic changes are noted. Vascular: No hyperdense vessel is noted. Skull: Normal. Negative for fracture or focal lesion. Sinuses/Orbits: No acute finding. Other: Small right frontal scalp swelling is noted. CT CERVICAL SPINE FINDINGS Alignment: Normal. Skull base and vertebrae: No acute fracture. No primary bone lesion or focal pathologic process. Soft tissues and spinal canal: No prevertebral fluid or swelling. No visible canal hematoma. Disc levels: Degenerative joint changes of the cervical spine with narrowed joint space and osteophyte formation identified throughout the mid and lower cervical spine. Upper chest: Negative. Other: None. CLINICAL DATA:  Status post fall today. EXAM: CT HEAD WITHOUT CONTRAST CT CERVICAL SPINE WITHOUT CONTRAST TECHNIQUE: Multidetector CT imaging of the head and cervical spine was performed following the standard protocol without intravenous contrast. Multiplanar CT image reconstructions of the cervical spine were also generated. COMPARISON:  May 02, 2019  FINDINGS: CT HEAD FINDINGS Brain: There is acute hemorrhage of bilateral medial frontal lobes. Adjacent subarachnoid hemorrhage is not excluded. There is no midline shift or hydrocephalus. Chronic diffuse atrophy is noted. Chronic bilateral periventricular white matter small vessel ischemic changes are noted. Vascular: No hyperdense vessel is noted. Skull: Normal. Negative for fracture or focal lesion. Sinuses/Orbits: No acute finding. Other: Small right frontal scalp swelling is noted. CT CERVICAL SPINE FINDINGS Alignment: Normal. Skull base and vertebrae: No acute fracture. No primary bone lesion or focal pathologic process. Soft tissues and spinal canal: No prevertebral fluid or swelling. No visible canal hematoma. Disc levels: Degenerative joint changes of the cervical spine with narrowed joint space and osteophyte formation identified throughout the mid and lower cervical spine. Upper chest: Negative. Other: None. IMPRESSION: 1. Acute hemorrhage of bilateral medial frontal lobes. Adjacent subarachnoid hemorrhage is not excluded. 2. No acute fracture or dislocation of cervical spine. 3. Degenerative joint changes of cervical spine. These results were called by telephone at the time of interpretation on 05/08/2019 at 11:06 am to provider [facility] , who verbally acknowledged these results. Electronically Signed   By: Abelardo Diesel M.D.   On: 05/08/2019 11:12   CT Cervical Spine Wo Contrast  Result Date: 05/08/2019 CLINICAL DATA:  Status post fall today. EXAM: CT HEAD WITHOUT CONTRAST CT CERVICAL SPINE WITHOUT CONTRAST TECHNIQUE: Multidetector CT imaging of the head and cervical spine was performed following the standard protocol without intravenous contrast. Multiplanar CT image reconstructions of the cervical spine were also generated. COMPARISON:  May 02, 2019 FINDINGS: CT HEAD FINDINGS Brain: There is acute hemorrhage of bilateral medial frontal lobes. Adjacent subarachnoid hemorrhage is not excluded.  There is no midline shift or hydrocephalus. Chronic diffuse atrophy is noted. Chronic bilateral periventricular white matter small vessel ischemic changes are noted. Vascular: No hyperdense vessel is noted. Skull: Normal. Negative for fracture or focal lesion. Sinuses/Orbits: No acute finding. Other: Small right frontal scalp swelling is noted. CT CERVICAL SPINE FINDINGS Alignment: Normal. Skull base and vertebrae: No acute fracture. No primary bone lesion or focal pathologic process. Soft tissues and spinal canal: No prevertebral fluid or swelling. No visible canal hematoma. Disc levels: Degenerative joint changes of the cervical spine with narrowed  joint space and osteophyte formation identified throughout the mid and lower cervical spine. Upper chest: Negative. Other: None. CLINICAL DATA:  Status post fall today. EXAM: CT HEAD WITHOUT CONTRAST CT CERVICAL SPINE WITHOUT CONTRAST TECHNIQUE: Multidetector CT imaging of the head and cervical spine was performed following the standard protocol without intravenous contrast. Multiplanar CT image reconstructions of the cervical spine were also generated. COMPARISON:  May 02, 2019 FINDINGS: CT HEAD FINDINGS Brain: There is acute hemorrhage of bilateral medial frontal lobes. Adjacent subarachnoid hemorrhage is not excluded. There is no midline shift or hydrocephalus. Chronic diffuse atrophy is noted. Chronic bilateral periventricular white matter small vessel ischemic changes are noted. Vascular: No hyperdense vessel is noted. Skull: Normal. Negative for fracture or focal lesion. Sinuses/Orbits: No acute finding. Other: Small right frontal scalp swelling is noted. CT CERVICAL SPINE FINDINGS Alignment: Normal. Skull base and vertebrae: No acute fracture. No primary bone lesion or focal pathologic process. Soft tissues and spinal canal: No prevertebral fluid or swelling. No visible canal hematoma. Disc levels: Degenerative joint changes of the cervical spine with narrowed  joint space and osteophyte formation identified throughout the mid and lower cervical spine. Upper chest: Negative. Other: None. IMPRESSION: 1. Acute hemorrhage of bilateral medial frontal lobes. Adjacent subarachnoid hemorrhage is not excluded. 2. No acute fracture or dislocation of cervical spine. 3. Degenerative joint changes of cervical spine. These results were called by telephone at the time of interpretation on 05/08/2019 at 11:06 am to provider [facility] , who verbally acknowledged these results. Electronically Signed   By: Sherian Rein M.D.   On: 05/08/2019 11:12    Procedures .Marland KitchenLaceration Repair  Date/Time: 05/08/2019 9:42 AM Performed by: Jacalyn Lefevre, MD Authorized by: Jacalyn Lefevre, MD   Consent:    Consent obtained:  Emergent situation   Consent given by:  Patient Anesthesia (see MAR for exact dosages):    Anesthesia method:  Local infiltration   Local anesthetic:  Lidocaine 1% WITH epi Laceration details:    Location:  Face   Face location:  R eyebrow   Length (cm):  2 Repair type:    Repair type:  Simple Pre-procedure details:    Preparation:  Patient was prepped and draped in usual sterile fashion Exploration:    Hemostasis achieved with:  Epinephrine Treatment:    Area cleansed with:  Hibiclens and saline   Amount of cleaning:  Standard Skin repair:    Repair method:  Sutures   Suture size:  6-0   Suture material:  Prolene   Number of sutures:  4 Approximation:    Approximation:  Close Post-procedure details:    Dressing:  Non-adherent dressing   Patient tolerance of procedure:  Tolerated well, no immediate complications  .Suture Removal  Date/Time: 05/08/2019 9:44 AM Performed by: Jacalyn Lefevre, MD Authorized by: Jacalyn Lefevre, MD   Consent:    Consent obtained:  Emergent situation   Alternatives discussed:  No treatment Location:    Location:  Upper extremity   Upper extremity location:  Elbow   Elbow location:  R elbow Procedure  details:    Wound appearance:  No signs of infection and good wound healing   Number of staples removed:  4 Post-procedure details:    Post-removal:  Dressing applied   Patient tolerance of procedure:  Tolerated well, no immediate complications Comments:     It is unclear when the staples were placed, but the wound was healing well, so they were removed.   (including critical care time)  Medications Ordered in ED Medications  lidocaine-EPINEPHrine (XYLOCAINE W/EPI) 2 %-1:200000 (PF) injection 10 mL (10 mLs Infiltration Given 05/08/19 0916)    ED Course  I have reviewed the triage vital signs and the nursing notes.  Pertinent labs & imaging results that were available during my care of the patient were reviewed by me and considered in my medical decision making (see chart for details).    MDM Rules/Calculators/A&P                      Pt d/w NS.  They recommend a re-scan 6 hrs from the initial CT.  If it is not worsening, then she can go back to the facility.  Pt's niece (POA) updated and knows plan.  Pt signed out to Dr. Pilar PlateBero at shift change.  Final Clinical Impression(s) / ED Diagnoses Final diagnoses:  Fall, initial encounter  Facial laceration, initial encounter  Punctate hemorrhage of frontal lobe [facility](HCC)    Rx / DC Orders ED Discharge Orders    None       Jacalyn LefevreHaviland, Kashmir Lysaght, MD 05/08/19 1445

## 2019-05-08 NOTE — Discharge Instructions (Addendum)
You were evaluated in the Emergency Department and after careful evaluation, we did not find any emergent condition requiring admission or further testing in the hospital.  Your exam/testing today was overall reassuring.  We found a small amount of new bleeding on the brain today but with a repeat CT scan it was largely unchanged.  We discussed your case with our neurosurgeons who felt that you were safe to go home.  Your sutures will need to be removed in 5-7 days.  Please return to the Emergency Department if you experience any worsening of your condition.  We encourage you to follow up with a primary care provider.  Thank you for allowing [place] to be a part of your care.

## 2019-05-08 NOTE — ED Notes (Signed)
Attempted to call report to [facility], no answer at this time. Will call back later.

## 2019-05-08 NOTE — ED Notes (Signed)
MD at bedside to suture.

## 2019-05-08 NOTE — ED Notes (Signed)
[facility] called for transportation. Paperwork at nurses station. 

## 2019-05-08 NOTE — ED Provider Notes (Signed)
  Provider Note MRN:  [id number]  Arrival date & time: 05/08/19    ED Course and Medical Decision Making  Assumed care from Dr. Particia Nearing at shift change.  Unwitnessed fall with head trauma, small frontal lobe hemorrhage on initial CT, neurosurgery is consulted, plan to obtain repeat CT at 5 PM to evaluate for worsening.  7 PM update: Repeat CT head showing slight worsening but no shift.  Case discussed with Dr. Conchita Paris and his PA.  Very minimal change, if patient is with an unchanged neurological exam she is safe for discharge.  Upon reassessment patient continues to have a nonfocal neurological exam, awake, interactive.  Discussed with patient's niece and POA over the phone who is also comfortable with discharge plan.  Procedures  Final Clinical Impressions(s) / ED Diagnoses     ICD-10-CM   1. Fall, initial encounter  W19.XXXA   2. Facial laceration, initial encounter  S01.81XA   3. Punctate hemorrhage of frontal lobe (HCC)  I61.1     ED Discharge Orders    None        Discharge Instructions     You were evaluated in the Emergency Department and after careful evaluation, we did not find any emergent condition requiring admission or further testing in the hospital.  Your exam/testing today was overall reassuring.  We found a small amount of new bleeding on the brain today but with a repeat CT scan it was largely unchanged.  We discussed your case with our neurosurgeons who felt that you were safe to go home.  Your sutures will need to be removed in 5-7 days.  Please return to the Emergency Department if you experience any worsening of your condition.  We encourage you to follow up with a primary care provider.  Thank you for allowing [place] to be a part of your care.      Elmer Sow. Pilar Plate, MD [facility] Emergency [facility] [email or web address]    Sabas Sous, MD 05/08/19 [phone]

## 2019-05-08 NOTE — ED Triage Notes (Signed)
86 yo female from [facility] s/p recent right hip surgery. S/P fall today. Unknown whether OOB or from standing position per EMS. No blood thinners. Lac to right eyebrow. No LOC per staff at rehab. Hx of Dementia and Alzheimer's. Pt denies pain.

## 2019-05-08 NOTE — ED Notes (Signed)
[facility] called and given report on pt returning to facility. Discharge instructions and suture care was discussed with Erline Levine who is the nurse over her department.

## 2019-05-19 ENCOUNTER — Ambulatory Visit (INDEPENDENT_AMBULATORY_CARE_PROVIDER_SITE_OTHER): Payer: Medicare Other

## 2019-05-19 ENCOUNTER — Ambulatory Visit (INDEPENDENT_AMBULATORY_CARE_PROVIDER_SITE_OTHER): Payer: Medicare Other | Admitting: Physician Assistant

## 2019-05-19 ENCOUNTER — Other Ambulatory Visit: Payer: Self-pay

## 2019-05-19 ENCOUNTER — Encounter: Payer: Self-pay | Admitting: Physician Assistant

## 2019-05-19 DIAGNOSIS — S72001D Fracture of unspecified part of neck of right femur, subsequent encounter for closed fracture with routine healing: Secondary | ICD-10-CM

## 2019-05-19 NOTE — Progress Notes (Signed)
HPI: Mrs. Gruenhagen comes in today 2 weeks status post cannulated pinning of the right hip fracture.  She presents with her niece who is her power of attorney.  She does have slight dementia.  Patient states that she had no real pain in the hip.  She has no complaints.  She is currently in a [employer] until she can transition back to [employer].   Physical exam: General no acute distress.  Alert and follows commands. Right lower extremity surgical incisions well approximated with staples no signs of infection.  Right calf supple nontender.  Dorsiflexion plantarflexion right ankle intact.  Radiographs: AP pelvis lateral view of right hip show postsurgical changes with cannulated screws in place.  Overall near anatomic reduction of the hip fracture.  No evidence of hardware failure.  No other bony abnormalities.  Impression: 2-week status post cannulated pinning right hip fracture  Plan: Staples removed Steri-Strips applied.  Weightbearing as tolerated right hip.  Follow-up in 1 month AP pelvis lateral view of the left hip at that time.  Questions were encouraged and answered both patient and her niece who is present throughout exam.

## 2019-10-01 ENCOUNTER — Emergency Department (HOSPITAL_COMMUNITY)
Admission: EM | Admit: 2019-10-01 | Discharge: 2019-10-02 | Disposition: A | Discharge status: Home / Self Care | Payer: Medicare Other | Attending: Emergency Medicine | Admitting: Emergency Medicine

## 2019-10-01 ENCOUNTER — Other Ambulatory Visit: Payer: Self-pay

## 2019-10-01 ENCOUNTER — Encounter (HOSPITAL_COMMUNITY): Payer: Self-pay | Admitting: Emergency Medicine

## 2019-10-01 DIAGNOSIS — R220 Localized swelling, mass and lump, head: Secondary | ICD-10-CM | POA: Diagnosis not present

## 2019-10-01 DIAGNOSIS — Y939 Activity, unspecified: Secondary | ICD-10-CM | POA: Diagnosis not present

## 2019-10-01 DIAGNOSIS — S00531A Contusion of lip, initial encounter: Secondary | ICD-10-CM | POA: Diagnosis not present

## 2019-10-01 DIAGNOSIS — Y929 Unspecified place or not applicable: Secondary | ICD-10-CM | POA: Insufficient documentation

## 2019-10-01 DIAGNOSIS — W19XXXA Unspecified fall, initial encounter: Secondary | ICD-10-CM | POA: Diagnosis not present

## 2019-10-01 DIAGNOSIS — S83209A Unspecified tear of unspecified meniscus, current injury, unspecified knee, initial encounter: Secondary | ICD-10-CM | POA: Insufficient documentation

## 2019-10-01 DIAGNOSIS — Y999 Unspecified external cause status: Secondary | ICD-10-CM | POA: Insufficient documentation

## 2019-10-01 DIAGNOSIS — Z5321 Procedure and treatment not carried out due to patient leaving prior to being seen by health care provider: Secondary | ICD-10-CM | POA: Insufficient documentation

## 2019-10-01 LAB — CBC
HCT: 41.6 % (ref 36.0–46.0)
Hemoglobin: 13 g/dL (ref 12.0–15.0)
MCH: 28.4 pg (ref 26.0–34.0)
MCHC: 31.3 g/dL (ref 30.0–36.0)
MCV: 91 fL (ref 80.0–100.0)
Platelets: 282 10*3/uL (ref 150–400)
RBC: 4.57 MIL/uL (ref 3.87–5.11)
RDW: 13.4 % (ref 11.5–15.5)
WBC: 10 10*3/uL (ref 4.0–10.5)
nRBC: 0 % (ref 0.0–0.2)

## 2019-10-01 LAB — BASIC METABOLIC PANEL
Anion gap: 10 (ref 5–15)
BUN: 29 mg/dL — ABNORMAL HIGH (ref 8–23)
CO2: 24 mmol/L (ref 22–32)
Calcium: 9.1 mg/dL (ref 8.9–10.3)
Chloride: 99 mmol/L (ref 98–111)
Creatinine, Ser: 0.93 mg/dL (ref 0.44–1.00)
GFR calc Af Amer: >60 mL/min (ref >60)
GFR calc non Af Amer: 52 mL/min — ABNORMAL LOW (ref >60)
Glucose, Bld: 118 mg/dL — ABNORMAL HIGH (ref 70–99)
Potassium: 3.5 mmol/L (ref 3.5–5.1)
Sodium: 133 mmol/L — ABNORMAL LOW (ref 135–145)

## 2019-10-01 NOTE — ED Triage Notes (Addendum)
Pt presents to ED BIB [facility] from [facility] memory care. Pt c/o knee pain, skin tea. Pt reports unwitnessed fall, unknown down time. Pt AAO at baseline to self. Pt has bruise to top lip and edema to nose. Per EMS pt had nose bleed controlled by facility. No blood thinners

## 2019-10-02 NOTE — ED Notes (Signed)
Pt is leaving with family member.

## 2021-01-03 ENCOUNTER — Emergency Department (HOSPITAL_COMMUNITY): Payer: Medicare Other

## 2021-01-03 ENCOUNTER — Inpatient Hospital Stay (HOSPITAL_COMMUNITY)
Admission: EM | Admit: 2021-01-03 | Discharge: 2021-01-10 | DRG: 956 | Disposition: A | Discharge status: Hospice | Payer: Medicare Other | Source: Skilled Nursing Facility | Attending: Internal Medicine | Admitting: Internal Medicine

## 2021-01-03 ENCOUNTER — Encounter (HOSPITAL_COMMUNITY): Payer: Self-pay | Admitting: Emergency Medicine

## 2021-01-03 ENCOUNTER — Other Ambulatory Visit: Payer: Self-pay

## 2021-01-03 DIAGNOSIS — R630 Anorexia: Secondary | ICD-10-CM | POA: Diagnosis present

## 2021-01-03 DIAGNOSIS — G309 Alzheimer's disease, unspecified: Secondary | ICD-10-CM | POA: Diagnosis present

## 2021-01-03 DIAGNOSIS — Z515 Encounter for palliative care: Secondary | ICD-10-CM | POA: Diagnosis not present

## 2021-01-03 DIAGNOSIS — I10 Essential (primary) hypertension: Secondary | ICD-10-CM | POA: Diagnosis present

## 2021-01-03 DIAGNOSIS — Z79899 Other long term (current) drug therapy: Secondary | ICD-10-CM | POA: Diagnosis not present

## 2021-01-03 DIAGNOSIS — W010XXA Fall on same level from slipping, tripping and stumbling without subsequent striking against object, initial encounter: Secondary | ICD-10-CM | POA: Diagnosis present

## 2021-01-03 DIAGNOSIS — R233 Spontaneous ecchymoses: Secondary | ICD-10-CM | POA: Diagnosis present

## 2021-01-03 DIAGNOSIS — Z66 Do not resuscitate: Secondary | ICD-10-CM | POA: Diagnosis present

## 2021-01-03 DIAGNOSIS — Z87891 Personal history of nicotine dependence: Secondary | ICD-10-CM | POA: Diagnosis not present

## 2021-01-03 DIAGNOSIS — Z880 Allergy status to penicillin: Secondary | ICD-10-CM

## 2021-01-03 DIAGNOSIS — Z20822 Contact with and (suspected) exposure to covid-19: Secondary | ICD-10-CM | POA: Diagnosis present

## 2021-01-03 DIAGNOSIS — S06320A Contusion and laceration of left cerebrum without loss of consciousness, initial encounter: Secondary | ICD-10-CM | POA: Diagnosis present

## 2021-01-03 DIAGNOSIS — M25512 Pain in left shoulder: Secondary | ICD-10-CM | POA: Diagnosis present

## 2021-01-03 DIAGNOSIS — S06890A Other specified intracranial injury without loss of consciousness, initial encounter: Secondary | ICD-10-CM | POA: Diagnosis not present

## 2021-01-03 DIAGNOSIS — S06330A Contusion and laceration of cerebrum, unspecified, without loss of consciousness, initial encounter: Secondary | ICD-10-CM | POA: Diagnosis not present

## 2021-01-03 DIAGNOSIS — F02C0 Dementia in other diseases classified elsewhere, severe, without behavioral disturbance, psychotic disturbance, mood disturbance, and anxiety: Secondary | ICD-10-CM | POA: Diagnosis present

## 2021-01-03 DIAGNOSIS — Z9889 Other specified postprocedural states: Secondary | ICD-10-CM

## 2021-01-03 DIAGNOSIS — E785 Hyperlipidemia, unspecified: Secondary | ICD-10-CM | POA: Diagnosis present

## 2021-01-03 DIAGNOSIS — Z79890 Hormone replacement therapy (postmenopausal): Secondary | ICD-10-CM | POA: Diagnosis not present

## 2021-01-03 DIAGNOSIS — Z7189 Other specified counseling: Secondary | ICD-10-CM | POA: Diagnosis not present

## 2021-01-03 DIAGNOSIS — S0003XA Contusion of scalp, initial encounter: Secondary | ICD-10-CM | POA: Diagnosis not present

## 2021-01-03 DIAGNOSIS — D72820 Lymphocytosis (symptomatic): Secondary | ICD-10-CM | POA: Diagnosis not present

## 2021-01-03 DIAGNOSIS — R296 Repeated falls: Secondary | ICD-10-CM | POA: Diagnosis not present

## 2021-01-03 DIAGNOSIS — Z7982 Long term (current) use of aspirin: Secondary | ICD-10-CM | POA: Diagnosis not present

## 2021-01-03 DIAGNOSIS — R32 Unspecified urinary incontinence: Secondary | ICD-10-CM | POA: Diagnosis present

## 2021-01-03 DIAGNOSIS — S72009A Fracture of unspecified part of neck of unspecified femur, initial encounter for closed fracture: Secondary | ICD-10-CM | POA: Diagnosis present

## 2021-01-03 DIAGNOSIS — S72002A Fracture of unspecified part of neck of left femur, initial encounter for closed fracture: Principal | ICD-10-CM

## 2021-01-03 DIAGNOSIS — R1110 Vomiting, unspecified: Secondary | ICD-10-CM | POA: Diagnosis not present

## 2021-01-03 DIAGNOSIS — Z6821 Body mass index (BMI) 21.0-21.9, adult: Secondary | ICD-10-CM

## 2021-01-03 DIAGNOSIS — F0390 Unspecified dementia without behavioral disturbance: Secondary | ICD-10-CM | POA: Diagnosis present

## 2021-01-03 DIAGNOSIS — E782 Mixed hyperlipidemia: Secondary | ICD-10-CM | POA: Diagnosis not present

## 2021-01-03 DIAGNOSIS — K5900 Constipation, unspecified: Secondary | ICD-10-CM

## 2021-01-03 DIAGNOSIS — S72012A Unspecified intracapsular fracture of left femur, initial encounter for closed fracture: Principal | ICD-10-CM | POA: Diagnosis present

## 2021-01-03 DIAGNOSIS — T148XXA Other injury of unspecified body region, initial encounter: Secondary | ICD-10-CM | POA: Diagnosis present

## 2021-01-03 DIAGNOSIS — D72829 Elevated white blood cell count, unspecified: Secondary | ICD-10-CM | POA: Diagnosis present

## 2021-01-03 DIAGNOSIS — R627 Adult failure to thrive: Secondary | ICD-10-CM | POA: Diagnosis not present

## 2021-01-03 DIAGNOSIS — S0689AA Other specified intracranial injury with loss of consciousness status unknown, initial encounter: Secondary | ICD-10-CM

## 2021-01-03 DIAGNOSIS — S72002D Fracture of unspecified part of neck of left femur, subsequent encounter for closed fracture with routine healing: Secondary | ICD-10-CM | POA: Diagnosis not present

## 2021-01-03 DIAGNOSIS — E039 Hypothyroidism, unspecified: Secondary | ICD-10-CM | POA: Diagnosis present

## 2021-01-03 DIAGNOSIS — S06330D Contusion and laceration of cerebrum, unspecified, without loss of consciousness, subsequent encounter: Secondary | ICD-10-CM | POA: Diagnosis not present

## 2021-01-03 DIAGNOSIS — W19XXXA Unspecified fall, initial encounter: Secondary | ICD-10-CM

## 2021-01-03 DIAGNOSIS — Z419 Encounter for procedure for purposes other than remedying health state, unspecified: Secondary | ICD-10-CM

## 2021-01-03 LAB — CBC WITH DIFFERENTIAL/PLATELET
Abs Immature Granulocytes: 0.11 10*3/uL — ABNORMAL HIGH (ref 0.00–0.07)
Basophils Absolute: 0.1 10*3/uL (ref 0.0–0.1)
Basophils Relative: 0 %
Eosinophils Absolute: 0.1 10*3/uL (ref 0.0–0.5)
Eosinophils Relative: 1 %
HCT: 42.3 % (ref 36.0–46.0)
Hemoglobin: 13.9 g/dL (ref 12.0–15.0)
Immature Granulocytes: 1 %
Lymphocytes Relative: 15 %
Lymphs Abs: 2 10*3/uL (ref 0.7–4.0)
MCH: 29.8 pg (ref 26.0–34.0)
MCHC: 32.9 g/dL (ref 30.0–36.0)
MCV: 90.6 fL (ref 80.0–100.0)
Monocytes Absolute: 0.9 10*3/uL (ref 0.1–1.0)
Monocytes Relative: 7 %
Neutro Abs: 10.6 10*3/uL — ABNORMAL HIGH (ref 1.7–7.7)
Neutrophils Relative %: 76 %
Platelets: 271 10*3/uL (ref 150–400)
RBC: 4.67 MIL/uL (ref 3.87–5.11)
RDW: 13.2 % (ref 11.5–15.5)
WBC: 13.9 10*3/uL — ABNORMAL HIGH (ref 4.0–10.5)
nRBC: 0 % (ref 0.0–0.2)

## 2021-01-03 LAB — COMPREHENSIVE METABOLIC PANEL
ALT: 20 U/L (ref 0–44)
AST: 27 U/L (ref 15–41)
Albumin: 4.1 g/dL (ref 3.5–5.0)
Alkaline Phosphatase: 79 U/L (ref 38–126)
Anion gap: 8 (ref 5–15)
BUN: 29 mg/dL — ABNORMAL HIGH (ref 8–23)
CO2: 27 mmol/L (ref 22–32)
Calcium: 9 mg/dL (ref 8.9–10.3)
Chloride: 105 mmol/L (ref 98–111)
Creatinine, Ser: 0.95 mg/dL (ref 0.44–1.00)
GFR, Estimated: 55 mL/min — ABNORMAL LOW (ref >60)
Glucose, Bld: 101 mg/dL — ABNORMAL HIGH (ref 70–99)
Potassium: 3.5 mmol/L (ref 3.5–5.1)
Sodium: 140 mmol/L (ref 135–145)
Total Bilirubin: 1.8 mg/dL — ABNORMAL HIGH (ref 0.3–1.2)
Total Protein: 7.4 g/dL (ref 6.5–8.1)

## 2021-01-03 LAB — RESP PANEL BY RT-PCR (FLU A&B, COVID) ARPGX2
Influenza A by PCR: NEGATIVE
Influenza B by PCR: NEGATIVE
SARS Coronavirus 2 by RT PCR: NEGATIVE

## 2021-01-03 MED ORDER — POLYETHYLENE GLYCOL 3350 17 G PO PACK
17.0000 g | PACK | Freq: Every day | ORAL | Status: DC
  Administered 2021-01-05 – 2021-01-07 (×4): 17 g via ORAL
  Filled 2021-01-03 (×4): qty 1

## 2021-01-03 MED ORDER — TRAZODONE HCL 50 MG PO TABS
50.0000 mg | ORAL_TABLET | Freq: Every day | ORAL | Status: DC
  Administered 2021-01-03 – 2021-01-10 (×6): 50 mg via ORAL
  Filled 2021-01-03 (×6): qty 1

## 2021-01-03 MED ORDER — PRAVASTATIN SODIUM 10 MG PO TABS
10.0000 mg | ORAL_TABLET | Freq: Every evening | ORAL | Status: DC
  Administered 2021-01-05 – 2021-01-06 (×2): 10 mg via ORAL
  Filled 2021-01-03 (×4): qty 1

## 2021-01-03 MED ORDER — LACTATED RINGERS IV SOLN: INTRAVENOUS | Status: DC

## 2021-01-03 MED ORDER — MELATONIN 5 MG PO TABS: 5.0000 mg | ORAL_TABLET | Freq: Every evening | ORAL | Status: DC | PRN

## 2021-01-03 MED ORDER — DOCUSATE SODIUM 100 MG PO CAPS
100.0000 mg | ORAL_CAPSULE | Freq: Two times a day (BID) | ORAL | Status: DC
  Administered 2021-01-03: 100 mg via ORAL
  Filled 2021-01-03: qty 1

## 2021-01-03 MED ORDER — LEVOTHYROXINE SODIUM 25 MCG PO TABS
12.5000 ug | ORAL_TABLET | ORAL | Status: DC
  Administered 2021-01-05: 12.5 ug via ORAL
  Filled 2021-01-03 (×2): qty 1

## 2021-01-03 MED ORDER — ZOLPIDEM TARTRATE 5 MG PO TABS
5.0000 mg | ORAL_TABLET | Freq: Every evening | ORAL | Status: DC | PRN
  Administered 2021-01-04: 5 mg via ORAL
  Filled 2021-01-03: qty 1

## 2021-01-03 MED ORDER — ASPIRIN EC 325 MG PO TBEC
325.0000 mg | DELAYED_RELEASE_TABLET | Freq: Every day | ORAL | Status: DC
  Administered 2021-01-05 – 2021-01-07 (×3): 325 mg via ORAL
  Filled 2021-01-03 (×4): qty 1

## 2021-01-03 MED ORDER — PANTOPRAZOLE SODIUM 40 MG PO TBEC
40.0000 mg | DELAYED_RELEASE_TABLET | Freq: Every day | ORAL | Status: DC
  Administered 2021-01-05 – 2021-01-07 (×3): 40 mg via ORAL
  Filled 2021-01-03 (×4): qty 1

## 2021-01-03 MED ORDER — ACETAMINOPHEN 500 MG PO TABS
1000.0000 mg | ORAL_TABLET | Freq: Once | ORAL | Status: AC
  Administered 2021-01-03: 1000 mg via ORAL
  Filled 2021-01-03: qty 2

## 2021-01-03 MED ORDER — MORPHINE SULFATE (PF) 2 MG/ML IV SOLN
1.0000 mg | INTRAVENOUS | Status: DC | PRN
  Administered 2021-01-03 – 2021-01-04 (×2): 1 mg via INTRAVENOUS
  Filled 2021-01-03 (×2): qty 1

## 2021-01-03 MED ORDER — DOCUSATE SODIUM 100 MG PO CAPS: 100.0000 mg | ORAL_CAPSULE | Freq: Two times a day (BID) | ORAL | Status: DC | PRN

## 2021-01-03 MED ORDER — HYDROCODONE-ACETAMINOPHEN 5-325 MG PO TABS: 1.0000 | ORAL_TABLET | Freq: Four times a day (QID) | ORAL | Status: DC | PRN

## 2021-01-03 NOTE — H&P (Signed)
History and Physical    [name] VQM:[id number] DOB: 1922/06/22 DOA: 01/03/2021  PCP: Almetta Lovely, [facility]  Chief Complaint: Unwitnessed fall.  HPI: [name] is a 87 y.o. female with medical history significant of dementia, HTN, HLD, hypothyroidism.  Presents with complaints of an unwitnessed fall. Patient is from [employer].  Found down in the bathroom.  She had an episode of vomiting when the RN was evaluating the patient.  Also had an episode of loss of bladder at the same time. No seizure-like events. No recent change in medication.  No nausea or vomiting. No frequent falls.  No reported chest pain, fever, diarrhea, strokelike symptoms prior to this as well.   Review of Systems: ROS   As per HPI otherwise 10 point review of systems negative.   Allergies  Allergen Reactions   Penicillins Other (See Comments)    Unknown Tolerates Keflex    Past Medical History:  Diagnosis Date   Alzheimer disease (HCC)    Dementia (HCC)    Essential hypertension    Hyperlipidemia     Past Surgical History:  Procedure Laterality Date   HIP PINNING,CANNULATED Right 05/03/2019   Procedure: CANNULATED HIP PINNING,dressing change right elbow;  Surgeon: Kathryne Hitch, MD;  Location: [facility];  Service: Orthopedics;  Laterality: Right;     reports that she has quit smoking. Her smoking use included cigarettes. She smoked an average of .25 packs per day. She has never used smokeless tobacco. She reports that she does not drink alcohol and does not use drugs.  History reviewed. No pertinent family history.  Prior to Admission medications   Medication Sig Start Date End Date Taking? Authorizing Provider  acetaminophen (TYLENOL) 325 MG tablet Take 650 mg by mouth See admin instructions. 650mg  daily scheduled and 650mg  every 6 hours as needed for headaches or mild pain   Yes [provider]  aspirin EC 325 MG EC tablet Take 1 tablet (325 mg total) by mouth daily with  breakfast. Patient taking differently: Take 325 mg by mouth daily. 05/07/19  Yes Pokhrel, Laxman, MD  calcium carbonate (TUMS - DOSED IN MG ELEMENTAL CALCIUM) 500 MG chewable tablet Chew 500 mg by mouth daily.   Yes [provider]  cholecalciferol (VITAMIN D) 25 MCG tablet Take 1 tablet (1,000 Units total) by mouth daily. 05/07/19  Yes Pokhrel, Laxman, MD  docusate sodium (COLACE) 100 MG capsule Take 1 capsule (100 mg total) by mouth 2 (two) times daily as needed for mild constipation. 05/07/19  Yes Pokhrel, Laxman, MD  hydrOXYzine (ATARAX/VISTARIL) 25 MG tablet Take 12.5 mg by mouth at bedtime as needed (insomnia).   Yes [provider]  levothyroxine (SYNTHROID) 25 MCG tablet Take 12.5 mcg by mouth every other day.   Yes [provider]  losartan (COZAAR) 50 MG tablet Take 1 tablet (50 mg total) by mouth daily. 05/07/19 03/05/21 Yes Pokhrel, Laxman, MD  Melatonin 5 MG TABS Take 5 mg by mouth at bedtime as needed (insomnia).   Yes [provider]  nystatin (MYCOSTATIN/NYSTOP) powder Apply 1 application topically 2 (two) times daily. Apply to vaginal redness area scheduled for 2 weeks then as needed.   Yes [provider]  omeprazole (PRILOSEC) 20 MG capsule Take 20 mg by mouth every other day. 03/05/19  Yes [provider]  polyethylene glycol (MIRALAX / GLYCOLAX) 17 g packet Take 17 g by mouth daily as needed for moderate constipation. Patient taking differently: Take 17 g by mouth  daily. 05/07/19  Yes Pokhrel, Laxman, MD  pravastatin (PRAVACHOL) 10 MG tablet Take 10 mg by mouth every evening. 03/05/19  Yes [provider]  traZODone (DESYREL) 50 MG tablet Take 50 mg by mouth at bedtime. 03/05/19  Yes [provider]    Physical Exam: Vitals:   01/03/21 1242 01/03/21 1415 01/03/21 1630 01/03/21 1945  BP: (!) 148/68 123/70 (!) 133/58 131/60  Pulse: 83 92 96 75  Resp: 18 14 16 17   Temp:      TempSrc:      SpO2: 94% 97% 95% 93%    General: Appear in mild distress, no Rash; Oral Mucosa Clear, moist. no Abnormal Neck Mass Or lumps, Conjunctiva normal  Cardiovascular: S1 and S2 Present, no Murmur, Respiratory: good respiratory effort, Bilateral Air entry present and CTA, no Crackles, no wheezes Abdomen: Bowel Sound present, Soft and no tenderness Extremities: no Pedal edema Neurology: alert and not oriented to time, place, and person affect appropriate. no new focal deficit Gait not checked due to patient safety concerns      Labs on Admission: I have personally reviewed the patients's labs and imaging studies.  Assessment/Plan * Fracture of femoral neck, left, closed (HCC) Presents with unwitnessed fall.  Does not have any significant pain.  CT scan confirms presence of femoral neck fracture on the left. Orthopedic consulted.  Currently recommending intramedullary nail placement surgery. Patient will be transferred to [facility] to facilitate this. Discussed with daughter she currently consenting and would like to proceed with the surgery. Recommended the patient will remain at high risk for encephalopathy postoperatively and most likely with her dementia may not be able to work with physical therapy which is a key part of recovery.  She understands it.  Patient actually had a surgery 2021 March and so far has done well per daughter.  Unwitnessed fall Etiology of the fall is not clear.  For now we will monitor clinically.  Scalp hematoma, initial encounter Unwitnessed fall.  No bleeding so far.  Monitor.  Contusion with petechial hemorrhage Presents with an unwitnessed fall. CT scan shows evidence of contusion as well as petechial hemorrhage on the left. Neurosurgery was consulted based on the evaluation does not recommend any procedure or intervention. No further imaging recommendation as well. No focal deficit other than chronic dementia at the time of my evaluation.  Vomiting Per staff at [facility],  patient had episode of vomiting as well as urination.  At the time of the fall although denies any seizure-like episode. For now we will monitor.  No focal deficit.  Leucocytosis Secondary to fall and fracture.  Monitor.  Currently no evidence of acute infection.  Essential hypertension Blood pressure currently normal.  We will hold antihypertensive medication and just provide IV fluids  Dementia without behavioral disturbance (HCC) Placing the patient at significant risk for poor outcomes postoperatively given her dementia. After her recent surgery per daughter patient actually has progressive decline in her ability to ambulate although at her baseline she is able to feed herself, dress herself and ambulate with assistance but mostly spends the day in bed. Patient may not be able to follow PT recommendation and nutrition guidance. Daughter is aware.  Hyperlipidemia Continue statin  Hypothyroidism Continue Synthroid  Preoperative medical evaluation No Coronary revascularization/CVA within 5 years. No Recent stress test Does not Climb flight of stair, participates in recreational activity,does household chores. No Prior adverse event with anesthesia. No Alcohol use, drug use.  A) Cardiac risk: Based on  RCRI  With this the patient is a moderate risk for adverse Cardiac outcome from surgery.  B) Pulmonary risk: No history of COPD or sleep apnea.  C) Bleeding risk Currently on aspirin and SCD.      Code Status: DNR   DVT Prophylaxis:   SCDs Start: 01/03/21 1535 Place TED hose Start: 01/03/21 1535   Family Communication: Discussed with daughter on the phone.  She will be here tomorrow afternoon.  Hoping that she will receive a call from the orthopedic surgeon. Admission status: Inpatient Med-Surg  Certification: The appropriate patient status for this patient is INPATIENT. Inpatient status is judged to be reasonable and necessary in order to provide the required intensity of  service to ensure the patient's safety. The patient's presenting symptoms, physical exam findings, and initial radiographic and laboratory data in the context of their chronic comorbidities is felt to place them at high risk for further clinical deterioration. Furthermore, it is not anticipated that the patient will be medically stable for discharge from the hospital within 2 midnights of admission.   * I certify that at the point of admission it is my clinical judgment that the patient will require inpatient hospital care spanning beyond 2 midnights from the point of admission due to high intensity of service, high risk for further deterioration and high frequency of surveillance required.Lynden Oxford MD [facility] If 7PM-7AM, please contact night-coverage [email or web address]  01/03/2021, 9:15 PM

## 2021-01-03 NOTE — ED Notes (Signed)
[facility] contacted for transport  

## 2021-01-03 NOTE — Assessment & Plan Note (Signed)
Unwitnessed fall.  No bleeding so far.  Monitor.

## 2021-01-03 NOTE — ED Notes (Signed)
Pt placed on 2L [place]

## 2021-01-03 NOTE — Progress Notes (Signed)
Patient ID: [name], female   DOB: 04-Jul-1922, 87 y.o.   MRN: [id number] I have taken care of this patient before.  She does have a left hip femoral neck fracture.  We requested that she be transferred to [facility] since I am operating there tomorrow afternoon and I am already performing 2 hip surgeries starting at noon.  I will see the patient at some point and address scheduling her for likely cannulated hip pinning of the left hip tomorrow late afternoon afternoon at [facility].

## 2021-01-03 NOTE — ED Provider Notes (Addendum)
[facility]-EMERGENCY DEPT Provider Note   CSN: [id number] Arrival date & time: 01/03/21  1129     History Chief Complaint  Patient presents with   [name] is a 87 y.o. female.  Patient is a 87 year old female with a history of dementia, recurrent falls, hypertension who is presenting today with EMS after being found down in the bathroom.  Patient lives in a memory care unit.  Per her POA [name] she had a great day on Saturday she had a shower and her hair washed and participated in activities.  There was no call yesterday about anything abnormal and then [facility] called her this morning because they went in and found her in the bathroom.  Patient denied any type of pain but EMS did note that she had injury to the left side of her head and she then did complain of some mild pain in her left shoulder.  She was able to ambulate to the stretcher without difficulty.  They did notice that her oxygen saturation was approximately 89% and they placed her on 2 L of oxygen.  She does not wear oxygen regularly.  She reports she feels a little short of breath but denies any chest pain.  There was no report of any coughing.  She denies any nausea or abdominal pain.  The history is provided by the patient, the EMS personnel and the nursing home (poa). The history is limited by the absence of a caregiver.  Fall      Past Medical History:  Diagnosis Date   Alzheimer disease (HCC)    Dementia [facility])    Essential hypertension    Hyperlipidemia     Patient Active Problem List   Diagnosis Date Noted   Closed right hip fracture (HCC) 05/02/2019   Subdural hematoma 03/21/2019   Essential hypertension 03/21/2019   Dementia without behavioral disturbance (HCC) 03/21/2019   Hyperlipidemia 03/21/2019   Hypokalemia 03/21/2019    Past Surgical History:  Procedure Laterality Date   HIP PINNING,CANNULATED Right 05/03/2019   Procedure: CANNULATED HIP PINNING,dressing  change right elbow;  Surgeon: Kathryne Hitch, MD;  Location: [facility] ORS;  Service: Orthopedics;  Laterality: Right;     OB History   No obstetric history on file.     History reviewed. No pertinent family history.  Social History   Tobacco Use   Smoking status: Former    Packs/day: 0.25    Types: Cigarettes   Smokeless tobacco: Never   Tobacco comments:    smoked socially  Vaping Use   Vaping Use: Never used  Substance Use Topics   Alcohol use: Never   Drug use: Never    Home Medications Prior to Admission medications   Medication Sig Start Date End Date Taking? Authorizing Provider  acetaminophen (TYLENOL) 325 MG tablet Take 650 mg by mouth See admin instructions. 650mg  daily scheduled and 650mg  every 6 hours as needed for headaches or mild pain   Yes [provider]  aspirin EC 325 MG EC tablet Take 1 tablet (325 mg total) by mouth daily with breakfast. Patient taking differently: Take 325 mg by mouth daily. 05/07/19  Yes Pokhrel, Laxman, MD  calcium carbonate (TUMS - DOSED IN MG ELEMENTAL CALCIUM) 500 MG chewable tablet Chew 500 mg by mouth daily.   Yes [provider]  cholecalciferol (VITAMIN D) 25 MCG tablet Take 1 tablet (1,000 Units total) by mouth daily. 05/07/19  Yes Pokhrel, 07/07/19, MD  docusate  sodium (COLACE) 100 MG capsule Take 1 capsule (100 mg total) by mouth 2 (two) times daily as needed for mild constipation. 05/07/19  Yes Pokhrel, Laxman, MD  hydrOXYzine (ATARAX/VISTARIL) 25 MG tablet Take 12.5 mg by mouth at bedtime as needed (insomnia).   Yes [provider]  levothyroxine (SYNTHROID) 25 MCG tablet Take 12.5 mcg by mouth every other day.   Yes [provider]  losartan (COZAAR) 50 MG tablet Take 1 tablet (50 mg total) by mouth daily. 05/07/19 03/05/21 Yes Pokhrel, Laxman, MD  Melatonin 5 MG TABS Take 5 mg by mouth at bedtime as needed (insomnia).   Yes [provider]  nystatin (MYCOSTATIN/NYSTOP) powder Apply 1  application topically 2 (two) times daily. Apply to vaginal redness area scheduled for 2 weeks then as needed.   Yes [provider]  omeprazole (PRILOSEC) 20 MG capsule Take 20 mg by mouth every other day. 03/05/19  Yes [provider]  polyethylene glycol (MIRALAX / GLYCOLAX) 17 g packet Take 17 g by mouth daily as needed for moderate constipation. Patient taking differently: Take 17 g by mouth daily. 05/07/19  Yes Pokhrel, Laxman, MD  pravastatin (PRAVACHOL) 10 MG tablet Take 10 mg by mouth every evening. 03/05/19  Yes [provider]  traZODone (DESYREL) 50 MG tablet Take 50 mg by mouth at bedtime. 03/05/19  Yes [provider]    Allergies    Penicillins  Review of Systems   Review of Systems  Unable to perform ROS: Dementia   Physical Exam Updated Vital Signs BP 123/70 (BP Location: Left Arm)   Pulse 92   Temp 98.4 F (36.9 C) (Oral)   Resp 14   SpO2 97%   Physical Exam Vitals and nursing note reviewed.  Constitutional:      General: She is not in acute distress.    Appearance: She is well-developed.  HENT:     Head: Normocephalic. Contusion present.   Eyes:     Pupils: Pupils are equal, round, and reactive to light.  Cardiovascular:     Rate and Rhythm: Normal rate and regular rhythm.     Heart sounds: Normal heart sounds. No murmur heard.   No friction rub.  Pulmonary:     Effort: Pulmonary effort is normal.     Breath sounds: Normal breath sounds. No wheezing or rales.  Abdominal:     General: Bowel sounds are normal. There is no distension.     Palpations: Abdomen is soft.     Tenderness: There is no abdominal tenderness. There is no guarding or rebound.  Musculoskeletal:        General: Tenderness present. Normal range of motion.     Left shoulder: Tenderness present. No deformity. Normal range of motion. Normal strength. Normal pulse.       Arms:     Cervical back: Normal range of motion and neck supple. No tenderness.      Left hip: Bony tenderness present. Normal range of motion.       Legs:     Comments: No edema  Skin:    General: Skin is warm and dry.     Findings: No rash.  Neurological:     Mental Status: She is alert.     Cranial Nerves: No cranial nerve deficit.     Sensory: No sensory deficit.     Motor: No weakness.     Comments: Oriented to person only.  Able to follow commands without difficulty.  Psychiatric:  Comments: Calm and cooperative    ED Results / Procedures / Treatments   Labs (all labs ordered are listed, but only abnormal results are displayed) Labs Reviewed  CBC WITH DIFFERENTIAL/PLATELET - Abnormal; Notable for the following components:      Result Value   WBC 13.9 (*)    Neutro Abs 10.6 (*)    Abs Immature Granulocytes 0.11 (*)    All other components within normal limits  COMPREHENSIVE METABOLIC PANEL - Abnormal; Notable for the following components:   Glucose, Bld 101 (*)    BUN 29 (*)    Total Bilirubin 1.8 (*)    GFR, Estimated 55 (*)    All other components within normal limits  RESP PANEL BY RT-PCR (FLU A&B, COVID) ARPGX2  URINALYSIS, ROUTINE W REFLEX MICROSCOPIC    EKG None  Radiology DG Chest 2 View  Result Date: 01/03/2021 CLINICAL DATA:  Larey Seat with shortness of breath. EXAM: CHEST - 2 VIEW COMPARISON:  05/08/2019 FINDINGS: Patient has taken a poor inspiration. Heart size is normal. There is chronic aortic atherosclerotic calcification. Lung markings appear prominent, felt most likely due to the poor inspiration. I cannot rule out a degree fluid overload or early edema. No focal infiltrate, collapse or effusion. IMPRESSION: Poor inspiration. Prominent lung markings felt most likely secondary to that. Cannot rule out a degree of fluid overload/early edema. Electronically Signed   By: Paulina Fusi M.D.   On: 01/03/2021 12:12   CT Head Wo Contrast  Result Date: 01/03/2021 CLINICAL DATA:  Trauma, fall EXAM: CT HEAD WITHOUT CONTRAST TECHNIQUE: Contiguous  axial images were obtained from the base of the skull through the vertex without intravenous contrast. COMPARISON:  05/08/2019 FINDINGS: Brain: In the image twenty-five of series 2, there is 6 mm focus of subtle increased density in the anteromedial left frontal cortex. There is no demonstrable high density in the subarachnoid or extra-axial location in the same region. In the previous study, there was large area of parenchymal and subarachnoid hemorrhage in the anteromedial frontal lobes on both sides, more so on the left side. In image 14, there is 6 mm ring-like calcification in the the floor of right anterior cranial fossa which appears stable. Ventricles are not dilated. There is no shift of midline structures. There is prominence of cortical sulci. There is decreased density in subcortical and periventricular white matter. Possible small old lacunar infarct is seen in the left basal ganglia. Vascular: There are scattered arterial calcifications. Skull: No fracture is seen in calvarium. There is subcutaneous contusion/hematoma in the left frontal scalp. Sinuses/Orbits: Unremarkable Other: None IMPRESSION: There is 6 mm focus of subtle increased density in the anteromedial left frontal cortex, possibly contusion with petechial hemorrhage. There is no demonstrable subarachnoid or extra-axial fluid collection. Short-term follow-up CT examination as clinically warranted should be considered. There is subcutaneous contusion/hematoma in the left frontal scalp. There is 6 mm ring-like calcific density along the floor of the right anterior cranial fossa, possibly dystrophic dural calcification or small meningioma. Atrophy.  Small-vessel disease. Electronically Signed   By: Ernie Avena M.D.   On: 01/03/2021 12:39   CT Hip Left Wo Contrast  Result Date: 01/03/2021 CLINICAL DATA:  Hip trauma, fracture suspected. Possible subcapital fracture on radiographs. EXAM: CT OF THE LEFT HIP WITHOUT CONTRAST TECHNIQUE:  Multidetector CT imaging of the left hip was performed according to the standard protocol. Multiplanar CT image reconstructions were also generated. COMPARISON:  Radiographs 01/03/2021 and 05/19/2019 FINDINGS: Bones/Joint/Cartilage There is a mildly  impacted subcapital fracture of the left femoral neck, best seen on the reformatted images. The femoral head is located. There is no significant hip arthropathy. A small left hip joint effusion is noted. The visualized inferior left hemipelvis appears unremarkable. Ligaments Suboptimally assessed by CT. Muscles and Tendons Unremarkable. Soft tissues No periarticular hematoma, soft tissue emphysema or unexpected foreign body. Moderate iliofemoral atherosclerosis noted. There are diverticular changes within the distal colon. IMPRESSION: 1. CT confirms the presence of a mildly impacted subcapital fracture of the left femoral neck. 2. No dislocation or significant underlying hip arthropathy. Electronically Signed   By: Carey Bullocks M.D.   On: 01/03/2021 13:24   DG Shoulder Left  Result Date: 01/03/2021 CLINICAL DATA:  Fall EXAM: LEFT SHOULDER - 2+ VIEW COMPARISON:  None. FINDINGS: There is no evidence of acute fracture or dislocation. There is mild glenohumeral and moderate AC joint degenerative change. IMPRESSION: No acute fracture or dislocation. Electronically Signed   By: Caprice Renshaw M.D.   On: 01/03/2021 12:16   DG Hip Unilat W or Wo Pelvis 2-3 Views Left  Result Date: 01/03/2021 CLINICAL DATA:  fall, some sob EXAM: DG HIP (WITH OR WITHOUT PELVIS) 2-3V LEFT COMPARISON:  Radiograph 05/19/2019 FINDINGS: Prior right femoral neck fixation. Intact hardware without evidence of loosening. There is possible femoral head neck offset on the lateral view versus is prominent osteophyte formation. Minimal bilateral hip degenerative changes. Vascular calcifications. IMPRESSION: Possible left-sided subcapital femoral neck fracture.  Recommend CT. Electronically Signed   By:  Caprice Renshaw M.D.   On: 01/03/2021 12:15    Procedures Procedures   Medications Ordered in ED Medications  acetaminophen (TYLENOL) tablet 1,000 mg (1,000 mg Oral Given 01/03/21 1245)    ED Course  I have reviewed the triage vital signs and the nursing notes.  Pertinent labs & imaging results that were available during my care of the patient were reviewed by me and considered in my medical decision making (see chart for details).    MDM Rules/Calculators/A&P                           Elderly female presenting today after a fall in her apartment.  Patient did hit the left side of her head and has some pain in her left shoulder and hip.  She was able to ambulate to the stretcher without difficulty but EMS noted that her oxygen saturation was 90% on room air and placed her on 2 L.  She does not usually use oxygen.  Facility had reported that she had been totally normal on Saturday.  May be a little more quiet over the last 24 hours but her POA had reported that she seemed to have been doing well until they found her today in the bathroom.  Patient is in no acute distress on exam but does have injury noted to the left side of her head.  Mild pain with range of motion.  Will discontinue oxygen and monitor O2 sats.  Images are pending.  Patient does not take anticoagulation.  3:02 PM Patient's chest x-ray without acute findings.  Hip imaging on the left hip shows concern for possible occult fracture.  CT of the hip confirmed a mildly impacted subcapital fracture of the left femoral neck.  Left shoulder imaging is negative.  Head CT showed that there is a 6 mm focus of subtle increased density in the anterior medial left frontal cortex possibly contusion with petechial hemorrhage.  Patient's O2 sat was 92% on room air.  Feel that is most likely positional.  She has not had cough and does not appear in any acute distress.  Will speak with [facility] as Dr. Magnus Ivan did her prior right hip  repair.  Spoke with [name] her POA and she feels the patient would want surgery.  She is normally ambulatory at baseline.  We will do gust with neurosurgery to ensure there is no further evaluation that needs to be done of the brain.  Labs are pending.  We will plan on admitting for hip fracture protocol.  3:02 PM Spoke with Dr. Franky Macho who evaluated the scans and pt needs no further evaluation on her head.  No further imaging.    3:03 PM Labs with leukocytosis most likely related to fracture.  Dr. Roda Shutters with ortho recommended transfer to [facility] as Dr. Magnus Ivan is operating there tomorrow.   MDM   Amount and/or Complexity of Data Reviewed Clinical lab tests: ordered and reviewed Tests in the radiology section of CPT: ordered and reviewed Tests in the medicine section of CPT: ordered and reviewed Decide to obtain previous medical records or to obtain history from someone other than the patient: yes Obtain history from someone other than the patient: yes Review and summarize past medical records: yes Discuss the patient with other providers: yes Independent visualization of images, tracings, or specimens: yes  Patient Progress Patient progress: stable  CRITICAL CARE Performed by: Lavonn Maxcy Total critical care time: 30 minutes Critical care time was exclusive of separately billable procedures and treating other patients. Critical care was necessary to treat or prevent imminent or life-threatening deterioration. Critical care was time spent personally by me on the following activities: development of treatment plan with patient and/or surrogate as well as nursing, discussions with consultants, evaluation of patient's response to treatment, examination of patient, obtaining history from patient or surrogate, ordering and performing treatments and interventions, ordering and review of laboratory studies, ordering and review of radiographic studies, pulse oximetry and re-evaluation of  patient's condition.   Final Clinical Impression(s) / ED Diagnoses Final diagnoses:  Left displaced femoral neck fracture (HCC)  Fall, initial encounter  Left-sided intracranial contusion, without loss of consciousness, initial encounter [facility])    Rx / DC Orders ED Discharge Orders     None        Gwyneth Sprout, MD 01/03/21 1354    Gwyneth Sprout, MD 01/03/21 1503

## 2021-01-03 NOTE — Assessment & Plan Note (Signed)
Etiology of the fall is not clear.  For now we will monitor clinically.

## 2021-01-03 NOTE — Assessment & Plan Note (Signed)
Presents with unwitnessed fall.  Does not have any significant pain.  CT scan confirms presence of femoral neck fracture on the left. Orthopedic consulted.  Currently recommending intramedullary nail placement surgery. Patient will be transferred to [facility] to facilitate this. Discussed with daughter she currently consenting and would like to proceed with the surgery. Recommended the patient will remain at high risk for encephalopathy postoperatively and most likely with her dementia may not be able to work with physical therapy which is a key part of recovery.  She understands it.  Patient actually had a surgery 2021 March and so far has done well per daughter.

## 2021-01-03 NOTE — Assessment & Plan Note (Signed)
Presents with an unwitnessed fall. CT scan shows evidence of contusion as well as petechial hemorrhage on the left. Neurosurgery was consulted based on the evaluation does not recommend any procedure or intervention. No further imaging recommendation as well. No focal deficit other than chronic dementia at the time of my evaluation.

## 2021-01-03 NOTE — Assessment & Plan Note (Signed)
Continue statin. 

## 2021-01-03 NOTE — ED Notes (Signed)
ED TO INPATIENT HANDOFF REPORT  ED Nurse Name and Phone #: Huntley Dec [phone]  S Name/Age/Gender [name] 87 y.o. female Room/Bed: WHALD/WHALD  Code Status   Code Status: DNR  Home/SNF/Other Nursing Home Patient oriented to: self Is this baseline? Yes   Triage Complete: Triage complete  Chief Complaint Hip fracture (HCC) [S72.009A]  Triage Note BIBA Per EMS: Pt coming from [facility]  Unwitnessed fall in bathroom  Bruising on L side of head and shoulder No c/o per pt.  Hx alzheimer's    Allergies Allergies  Allergen Reactions   Penicillins Other (See Comments)    Unknown Tolerates Keflex    Level of Care/Admitting Diagnosis ED Disposition     ED Disposition  Admit   Condition  --   Comment  Hospital Area: [facility] [[id number]]  Level of Care: Med-Surg [16]  May admit patient to [facility] or [facility] if equivalent level of care is available:: No  Covid Evaluation: Asymptomatic Screening Protocol (No Symptoms)  Diagnosis: Hip fracture [facility]) [[id number]]  Admitting Physician: Rolly Salter [[id number]]  Attending Physician: Rolly Salter [[id number]]  Estimated length of stay: past midnight tomorrow  Certification:: I certify this patient is being admitted for an inpatient-only procedure          B Medical/Surgery History Past Medical History:  Diagnosis Date   Alzheimer disease (HCC)    Dementia (HCC)    Essential hypertension    Hyperlipidemia    Past Surgical History:  Procedure Laterality Date   HIP PINNING,CANNULATED Right 05/03/2019   Procedure: CANNULATED HIP PINNING,dressing change right elbow;  Surgeon: Kathryne Hitch, MD;  Location: [facility];  Service: Orthopedics;  Laterality: Right;     A IV Location/Drains/Wounds Patient Lines/Drains/Airways Status     Active Line/Drains/Airways     Name Placement date Placement time Site Days   Peripheral IV 01/03/21 20 G 1" Anterior;Distal;Left Forearm 01/03/21  1953   Forearm  less than 1   External Urinary Catheter 05/05/19  1053  --  609   Incision (Closed) 05/03/19 Hip 05/03/19  0846  -- 611            Intake/Output Last 24 hours No intake or output data in the 24 hours ending 01/03/21 2057  Labs/Imaging Results for orders placed or performed during the hospital encounter of 01/03/21 (from the past 48 hour(s))  CBC with Differential/Platelet     Status: Abnormal   Collection Time: 01/03/21  1:34 PM  Result Value Ref Range   WBC 13.9 (H) 4.0 - 10.5 K/uL   RBC 4.67 3.87 - 5.11 MIL/uL   Hemoglobin 13.9 12.0 - 15.0 g/dL   HCT 77.8 24.2 - 35.3 %   MCV 90.6 80.0 - 100.0 fL   MCH 29.8 26.0 - 34.0 pg   MCHC 32.9 30.0 - 36.0 g/dL   RDW 61.4 43.1 - 54.0 %   Platelets 271 150 - 400 K/uL   nRBC 0.0 0.0 - 0.2 %   Neutrophils Relative % 76 %   Neutro Abs 10.6 (H) 1.7 - 7.7 K/uL   Lymphocytes Relative 15 %   Lymphs Abs 2.0 0.7 - 4.0 K/uL   Monocytes Relative 7 %   Monocytes Absolute 0.9 0.1 - 1.0 K/uL   Eosinophils Relative 1 %   Eosinophils Absolute 0.1 0.0 - 0.5 K/uL   Basophils Relative 0 %   Basophils Absolute 0.1 0.0 - 0.1 K/uL   Immature Granulocytes 1 %   Abs  Immature Granulocytes 0.11 (H) 0.00 - 0.07 K/uL    Comment: Performed at [facility], [address], [place], [place] [address]  Comprehensive metabolic panel     Status: Abnormal   Collection Time: 01/03/21  1:34 PM  Result Value Ref Range   Sodium 140 135 - 145 mmol/L   Potassium 3.5 3.5 - 5.1 mmol/L   Chloride 105 98 - 111 mmol/L   CO2 27 22 - 32 mmol/L   Glucose, Bld 101 (H) 70 - 99 mg/dL    Comment: Glucose reference range applies only to samples taken after fasting for at least 8 hours.   BUN 29 (H) 8 - 23 mg/dL   Creatinine, Ser 4.82 0.44 - 1.00 mg/dL   Calcium 9.0 8.9 - 50.0 mg/dL   Total Protein 7.4 6.5 - 8.1 g/dL   Albumin 4.1 3.5 - 5.0 g/dL   AST 27 15 - 41 U/L   ALT 20 0 - 44 U/L   Alkaline Phosphatase 79 38 - 126 U/L   Total Bilirubin 1.8  (H) 0.3 - 1.2 mg/dL   GFR, Estimated 55 (L) >60 mL/min    Comment: (NOTE) Calculated using the CKD-EPI Creatinine Equation (2021)    Anion gap 8 5 - 15    Comment: Performed at [facility], [address] [place], [place] [address]  Resp Panel by RT-PCR (Flu A&B, Covid) Nasopharyngeal Swab     Status: None   Collection Time: 01/03/21  1:50 PM   Specimen: Nasopharyngeal Swab; Nasopharyngeal(NP) swabs in vial transport medium  Result Value Ref Range   SARS Coronavirus 2 by RT PCR NEGATIVE NEGATIVE    Comment: (NOTE) SARS-CoV-2 target nucleic acids are NOT DETECTED.  The SARS-CoV-2 RNA is generally detectable in upper respiratory specimens during the acute phase of infection. The lowest concentration of SARS-CoV-2 viral copies this assay can detect is 138 copies/mL. A negative result does not preclude SARS-Cov-2 infection and should not be used as the sole basis for treatment or other patient management decisions. A negative result may occur with  improper specimen collection/handling, submission of specimen other than nasopharyngeal swab, presence of viral mutation(s) within the areas targeted by this assay, and inadequate number of viral copies(<138 copies/mL). A negative result must be combined with clinical observations, patient history, and epidemiological information. The expected result is Negative.  Fact Sheet for Patients:  BloggerCourse.com  Fact Sheet for Healthcare Providers:  SeriousBroker.it  This test is no t yet approved or cleared by the [place] FDA and  has been authorized for detection and/or diagnosis of SARS-CoV-2 by FDA under an Emergency Use Authorization (EUA). This EUA will remain  in effect (meaning this test can be used) for the duration of the COVID-19 declaration under Section 564(b)(1) of the Act, 21 U.S.C.section 360bbb-3(b)(1), unless the authorization is terminated  or  revoked sooner.       Influenza A by PCR NEGATIVE NEGATIVE   Influenza B by PCR NEGATIVE NEGATIVE    Comment: (NOTE) The Xpert Xpress SARS-CoV-2/FLU/RSV plus assay is intended as an aid in the diagnosis of influenza from Nasopharyngeal swab specimens and should not be used as a sole basis for treatment. Nasal washings and aspirates are unacceptable for Xpert Xpress SARS-CoV-2/FLU/RSV testing.  Fact Sheet for Patients: BloggerCourse.com  Fact Sheet for Healthcare Providers: SeriousBroker.it  This test is not yet approved or cleared by the [place] FDA and has been authorized for detection and/or diagnosis of SARS-CoV-2 by FDA under  an Emergency Use Authorization (EUA). This EUA will remain in effect (meaning this test can be used) for the duration of the COVID-19 declaration under Section 564(b)(1) of the Act, 21 U.S.C. section 360bbb-3(b)(1), unless the authorization is terminated or revoked.  Performed at [facility], [address] [place], [place] [address]    DG Chest 2 View  Result Date: 01/03/2021 CLINICAL DATA:  Larey Seat with shortness of breath. EXAM: CHEST - 2 VIEW COMPARISON:  05/08/2019 FINDINGS: Patient has taken a poor inspiration. Heart size is normal. There is chronic aortic atherosclerotic calcification. Lung markings appear prominent, felt most likely due to the poor inspiration. I cannot rule out a degree fluid overload or early edema. No focal infiltrate, collapse or effusion. IMPRESSION: Poor inspiration. Prominent lung markings felt most likely secondary to that. Cannot rule out a degree of fluid overload/early edema. Electronically Signed   By: Paulina Fusi M.D.   On: 01/03/2021 12:12   CT Head Wo Contrast  Result Date: 01/03/2021 CLINICAL DATA:  Trauma, fall EXAM: CT HEAD WITHOUT CONTRAST TECHNIQUE: Contiguous axial images were obtained from the base of the skull through the vertex  without intravenous contrast. COMPARISON:  05/08/2019 FINDINGS: Brain: In the image twenty-five of series 2, there is 6 mm focus of subtle increased density in the anteromedial left frontal cortex. There is no demonstrable high density in the subarachnoid or extra-axial location in the same region. In the previous study, there was large area of parenchymal and subarachnoid hemorrhage in the anteromedial frontal lobes on both sides, more so on the left side. In image 14, there is 6 mm ring-like calcification in the the floor of right anterior cranial fossa which appears stable. Ventricles are not dilated. There is no shift of midline structures. There is prominence of cortical sulci. There is decreased density in subcortical and periventricular white matter. Possible small old lacunar infarct is seen in the left basal ganglia. Vascular: There are scattered arterial calcifications. Skull: No fracture is seen in calvarium. There is subcutaneous contusion/hematoma in the left frontal scalp. Sinuses/Orbits: Unremarkable Other: None IMPRESSION: There is 6 mm focus of subtle increased density in the anteromedial left frontal cortex, possibly contusion with petechial hemorrhage. There is no demonstrable subarachnoid or extra-axial fluid collection. Short-term follow-up CT examination as clinically warranted should be considered. There is subcutaneous contusion/hematoma in the left frontal scalp. There is 6 mm ring-like calcific density along the floor of the right anterior cranial fossa, possibly dystrophic dural calcification or small meningioma. Atrophy.  Small-vessel disease. Electronically Signed   By: Ernie Avena M.D.   On: 01/03/2021 12:39   CT Hip Left Wo Contrast  Result Date: 01/03/2021 CLINICAL DATA:  Hip trauma, fracture suspected. Possible subcapital fracture on radiographs. EXAM: CT OF THE LEFT HIP WITHOUT CONTRAST TECHNIQUE: Multidetector CT imaging of the left hip was performed according to the  standard protocol. Multiplanar CT image reconstructions were also generated. COMPARISON:  Radiographs 01/03/2021 and 05/19/2019 FINDINGS: Bones/Joint/Cartilage There is a mildly impacted subcapital fracture of the left femoral neck, best seen on the reformatted images. The femoral head is located. There is no significant hip arthropathy. A small left hip joint effusion is noted. The visualized inferior left hemipelvis appears unremarkable. Ligaments Suboptimally assessed by CT. Muscles and Tendons Unremarkable. Soft tissues No periarticular hematoma, soft tissue emphysema or unexpected foreign body. Moderate iliofemoral atherosclerosis noted. There are diverticular changes within the distal colon. IMPRESSION: 1. CT confirms the presence of a mildly impacted subcapital fracture of  the left femoral neck. 2. No dislocation or significant underlying hip arthropathy. Electronically Signed   By: Carey Bullocks M.D.   On: 01/03/2021 13:24   DG Shoulder Left  Result Date: 01/03/2021 CLINICAL DATA:  Fall EXAM: LEFT SHOULDER - 2+ VIEW COMPARISON:  None. FINDINGS: There is no evidence of acute fracture or dislocation. There is mild glenohumeral and moderate AC joint degenerative change. IMPRESSION: No acute fracture or dislocation. Electronically Signed   By: Caprice Renshaw M.D.   On: 01/03/2021 12:16   DG Hip Unilat W or Wo Pelvis 2-3 Views Left  Result Date: 01/03/2021 CLINICAL DATA:  fall, some sob EXAM: DG HIP (WITH OR WITHOUT PELVIS) 2-3V LEFT COMPARISON:  Radiograph 05/19/2019 FINDINGS: Prior right femoral neck fixation. Intact hardware without evidence of loosening. There is possible femoral head neck offset on the lateral view versus is prominent osteophyte formation. Minimal bilateral hip degenerative changes. Vascular calcifications. IMPRESSION: Possible left-sided subcapital femoral neck fracture.  Recommend CT. Electronically Signed   By: Caprice Renshaw M.D.   On: 01/03/2021 12:15    Pending Labs Unresulted  Labs (From admission, onward)     Start     Ordered   01/04/21 0500  Basic metabolic panel  Daily,   R     Question:  Specimen collection method  Answer:  IV Team=IV Team collect   01/03/21 1542   01/04/21 0500  CBC  Daily,   R     Question:  Specimen collection method  Answer:  IV Team=IV Team collect   01/03/21 1542            Vitals/Pain Today's Vitals   01/03/21 1242 01/03/21 1415 01/03/21 1630 01/03/21 1945  BP: (!) 148/68 123/70 (!) 133/58 131/60  Pulse: 83 92 96 75  Resp: 18 14 16 17   Temp:      TempSrc:      SpO2: 94% 97% 95% 93%    Isolation Precautions No active isolations  Medications Medications  aspirin EC tablet 325 mg (has no administration in time range)  docusate sodium (COLACE) capsule 100 mg (has no administration in time range)  levothyroxine (SYNTHROID) tablet 12.5 mcg (has no administration in time range)  melatonin tablet 5 mg (has no administration in time range)  pantoprazole (PROTONIX) EC tablet 40 mg (has no administration in time range)  polyethylene glycol (MIRALAX / GLYCOLAX) packet 17 g (has no administration in time range)  pravastatin (PRAVACHOL) tablet 10 mg (has no administration in time range)  traZODone (DESYREL) tablet 50 mg (has no administration in time range)  HYDROcodone-acetaminophen (NORCO/VICODIN) 5-325 MG per tablet 1-2 tablet (has no administration in time range)  morphine 2 MG/ML injection 1 mg (has no administration in time range)  lactated ringers infusion ( Intravenous New Bag/Given 01/03/21 1955)  zolpidem (AMBIEN) tablet 5 mg (has no administration in time range)  docusate sodium (COLACE) capsule 100 mg (has no administration in time range)  acetaminophen (TYLENOL) tablet 1,000 mg (1,000 mg Oral Given 01/03/21 1245)    Mobility non-ambulatory High fall risk   Focused Assessments    R Recommendations: See Admitting Provider Note  Report given to:   Additional Notes:

## 2021-01-03 NOTE — Assessment & Plan Note (Signed)
Secondary to fall and fracture.  Monitor.  Currently no evidence of acute infection.

## 2021-01-03 NOTE — Assessment & Plan Note (Signed)
Placing the patient at significant risk for poor outcomes postoperatively given her dementia. After her recent surgery per daughter patient actually has progressive decline in her ability to ambulate although at her baseline she is able to feed herself, dress herself and ambulate with assistance but mostly spends the day in bed. Patient may not be able to follow PT recommendation and nutrition guidance. Daughter is aware.

## 2021-01-03 NOTE — ED Triage Notes (Signed)
BIBA Per EMS: Pt coming from [facility]  Unwitnessed fall in bathroom  Bruising on L side of head and shoulder No c/o per pt.  Hx alzheimer's

## 2021-01-03 NOTE — Assessment & Plan Note (Signed)
Continue Synthroid °

## 2021-01-03 NOTE — ED Notes (Signed)
[facility] at bedside 

## 2021-01-03 NOTE — Assessment & Plan Note (Signed)
Per staff at [facility], patient had episode of vomiting as well as urination.  At the time of the fall although denies any seizure-like episode. For now we will monitor.  No focal deficit.

## 2021-01-03 NOTE — Progress Notes (Signed)
Patient ID: [name], female   DOB: 04-08-1922, 87 y.o.   MRN: [id number] Films reviewed. There is no operative indication for this 87 yo woman. No repeat film is necessary, unless there is a change in her exam.

## 2021-01-03 NOTE — Assessment & Plan Note (Signed)
Blood pressure currently normal.  We will hold antihypertensive medication and just provide IV fluids

## 2021-01-04 ENCOUNTER — Encounter (HOSPITAL_COMMUNITY)
Admission: EM | Disposition: A | Discharge status: Hospice | Payer: Self-pay | Source: Skilled Nursing Facility | Attending: Internal Medicine

## 2021-01-04 ENCOUNTER — Inpatient Hospital Stay (HOSPITAL_COMMUNITY): Payer: Medicare Other

## 2021-01-04 ENCOUNTER — Encounter (HOSPITAL_COMMUNITY): Payer: Self-pay | Admitting: Internal Medicine

## 2021-01-04 ENCOUNTER — Inpatient Hospital Stay (HOSPITAL_COMMUNITY): Payer: Medicare Other | Admitting: Anesthesiology

## 2021-01-04 DIAGNOSIS — F0390 Unspecified dementia without behavioral disturbance: Secondary | ICD-10-CM

## 2021-01-04 DIAGNOSIS — S06330D Contusion and laceration of cerebrum, unspecified, without loss of consciousness, subsequent encounter: Secondary | ICD-10-CM

## 2021-01-04 DIAGNOSIS — R1110 Vomiting, unspecified: Secondary | ICD-10-CM

## 2021-01-04 DIAGNOSIS — D72820 Lymphocytosis (symptomatic): Secondary | ICD-10-CM

## 2021-01-04 DIAGNOSIS — I10 Essential (primary) hypertension: Secondary | ICD-10-CM

## 2021-01-04 DIAGNOSIS — S72002A Fracture of unspecified part of neck of left femur, initial encounter for closed fracture: Secondary | ICD-10-CM

## 2021-01-04 DIAGNOSIS — E039 Hypothyroidism, unspecified: Secondary | ICD-10-CM

## 2021-01-04 DIAGNOSIS — E782 Mixed hyperlipidemia: Secondary | ICD-10-CM

## 2021-01-04 DIAGNOSIS — S0003XA Contusion of scalp, initial encounter: Secondary | ICD-10-CM

## 2021-01-04 HISTORY — PX: HIP PINNING,CANNULATED: SHX1758

## 2021-01-04 LAB — BASIC METABOLIC PANEL
Anion gap: 10 (ref 5–15)
BUN: 20 mg/dL (ref 8–23)
CO2: 24 mmol/L (ref 22–32)
Calcium: 8.7 mg/dL — ABNORMAL LOW (ref 8.9–10.3)
Chloride: 103 mmol/L (ref 98–111)
Creatinine, Ser: 0.8 mg/dL (ref 0.44–1.00)
GFR, Estimated: >60 mL/min (ref >60)
Glucose, Bld: 96 mg/dL (ref 70–99)
Potassium: 3.7 mmol/L (ref 3.5–5.1)
Sodium: 137 mmol/L (ref 135–145)

## 2021-01-04 LAB — CBC
HCT: 38.4 % (ref 36.0–46.0)
Hemoglobin: 12.4 g/dL (ref 12.0–15.0)
MCH: 29.2 pg (ref 26.0–34.0)
MCHC: 32.3 g/dL (ref 30.0–36.0)
MCV: 90.4 fL (ref 80.0–100.0)
Platelets: 238 10*3/uL (ref 150–400)
RBC: 4.25 MIL/uL (ref 3.87–5.11)
RDW: 12.9 % (ref 11.5–15.5)
WBC: 11.7 10*3/uL — ABNORMAL HIGH (ref 4.0–10.5)
nRBC: 0 % (ref 0.0–0.2)

## 2021-01-04 LAB — SURGICAL PCR SCREEN
MRSA, PCR: NEGATIVE
Staphylococcus aureus: NEGATIVE

## 2021-01-04 SURGERY — FIXATION, FEMUR, NECK, PERCUTANEOUS, USING SCREW
Anesthesia: General | Site: Hip | Laterality: Left

## 2021-01-04 MED ORDER — PHENOL 1.4 % MT LIQD: 1.0000 | OROMUCOSAL | Status: DC | PRN

## 2021-01-04 MED ORDER — LIDOCAINE 2% (20 MG/ML) 5 ML SYRINGE
INTRAMUSCULAR | Status: AC
  Filled 2021-01-04: qty 5

## 2021-01-04 MED ORDER — SUGAMMADEX SODIUM 200 MG/2ML IV SOLN
INTRAVENOUS | Status: DC | PRN
  Administered 2021-01-04 (×2): 100 mg via INTRAVENOUS

## 2021-01-04 MED ORDER — ONDANSETRON HCL 4 MG/2ML IJ SOLN
INTRAMUSCULAR | Status: DC | PRN
  Administered 2021-01-04: 4 mg via INTRAVENOUS

## 2021-01-04 MED ORDER — CHLORHEXIDINE GLUCONATE 0.12 % MT SOLN
OROMUCOSAL | Status: AC
  Filled 2021-01-04: qty 15

## 2021-01-04 MED ORDER — MENTHOL 3 MG MT LOZG: 1.0000 | LOZENGE | OROMUCOSAL | Status: DC | PRN

## 2021-01-04 MED ORDER — MORPHINE SULFATE (PF) 2 MG/ML IV SOLN
0.5000 mg | INTRAVENOUS | Status: DC | PRN
  Administered 2021-01-04 – 2021-01-05 (×2): 1 mg via INTRAVENOUS
  Administered 2021-01-07: 0.5 mg via INTRAVENOUS
  Filled 2021-01-04 (×3): qty 1

## 2021-01-04 MED ORDER — CEFAZOLIN SODIUM 1 G IJ SOLR
INTRAMUSCULAR | Status: AC
  Filled 2021-01-04: qty 20

## 2021-01-04 MED ORDER — 0.9 % SODIUM CHLORIDE (POUR BTL) OPTIME
TOPICAL | Status: DC | PRN
  Administered 2021-01-04: 1000 mL

## 2021-01-04 MED ORDER — TRANEXAMIC ACID-NACL 1000-0.7 MG/100ML-% IV SOLN
1000.0000 mg | Freq: Once | INTRAVENOUS | Status: AC
  Administered 2021-01-04: 1000 mg via INTRAVENOUS
  Filled 2021-01-04: qty 100

## 2021-01-04 MED ORDER — PHENYLEPHRINE 40 MCG/ML (10ML) SYRINGE FOR IV PUSH (FOR BLOOD PRESSURE SUPPORT)
PREFILLED_SYRINGE | INTRAVENOUS | Status: AC
  Filled 2021-01-04: qty 10

## 2021-01-04 MED ORDER — ACETAMINOPHEN 325 MG PO TABS
325.0000 mg | ORAL_TABLET | Freq: Four times a day (QID) | ORAL | Status: DC | PRN
  Administered 2021-01-07: 650 mg via ORAL
  Filled 2021-01-04: qty 2

## 2021-01-04 MED ORDER — PHENYLEPHRINE 40 MCG/ML (10ML) SYRINGE FOR IV PUSH (FOR BLOOD PRESSURE SUPPORT)
PREFILLED_SYRINGE | INTRAVENOUS | Status: DC | PRN
  Administered 2021-01-04: 120 ug via INTRAVENOUS
  Administered 2021-01-04: 80 ug via INTRAVENOUS
  Administered 2021-01-04: 120 ug via INTRAVENOUS

## 2021-01-04 MED ORDER — ROCURONIUM BROMIDE 10 MG/ML (PF) SYRINGE
PREFILLED_SYRINGE | INTRAVENOUS | Status: AC
  Filled 2021-01-04: qty 10

## 2021-01-04 MED ORDER — LIDOCAINE 2% (20 MG/ML) 5 ML SYRINGE
INTRAMUSCULAR | Status: DC | PRN
  Administered 2021-01-04: 40 mg via INTRAVENOUS

## 2021-01-04 MED ORDER — HYDROCODONE-ACETAMINOPHEN 5-325 MG PO TABS
1.0000 | ORAL_TABLET | ORAL | Status: DC | PRN
  Administered 2021-01-08: 1 via ORAL
  Filled 2021-01-04: qty 1

## 2021-01-04 MED ORDER — ROCURONIUM BROMIDE 10 MG/ML (PF) SYRINGE
PREFILLED_SYRINGE | INTRAVENOUS | Status: DC | PRN
  Administered 2021-01-04: 50 mg via INTRAVENOUS

## 2021-01-04 MED ORDER — PROPOFOL 10 MG/ML IV BOLUS
INTRAVENOUS | Status: DC | PRN
  Administered 2021-01-04: 70 mg via INTRAVENOUS

## 2021-01-04 MED ORDER — DOCUSATE SODIUM 100 MG PO CAPS
100.0000 mg | ORAL_CAPSULE | Freq: Two times a day (BID) | ORAL | Status: DC
  Administered 2021-01-04 – 2021-01-07 (×4): 100 mg via ORAL
  Filled 2021-01-04 (×5): qty 1

## 2021-01-04 MED ORDER — HYDROCODONE-ACETAMINOPHEN 7.5-325 MG PO TABS
1.0000 | ORAL_TABLET | ORAL | Status: DC | PRN
  Administered 2021-01-05 – 2021-01-06 (×2): 1 via ORAL
  Filled 2021-01-04 (×2): qty 1

## 2021-01-04 MED ORDER — ONDANSETRON HCL 4 MG PO TABS: 4.0000 mg | ORAL_TABLET | Freq: Four times a day (QID) | ORAL | Status: DC | PRN

## 2021-01-04 MED ORDER — ORAL CARE MOUTH RINSE: 15.0000 mL | Freq: Once | OROMUCOSAL | Status: DC

## 2021-01-04 MED ORDER — METOCLOPRAMIDE HCL 5 MG PO TABS: 5.0000 mg | ORAL_TABLET | Freq: Three times a day (TID) | ORAL | Status: DC | PRN

## 2021-01-04 MED ORDER — ONDANSETRON HCL 4 MG/2ML IJ SOLN
INTRAMUSCULAR | Status: AC
  Filled 2021-01-04: qty 2

## 2021-01-04 MED ORDER — FENTANYL CITRATE (PF) 100 MCG/2ML IJ SOLN: 25.0000 ug | INTRAMUSCULAR | Status: DC | PRN

## 2021-01-04 MED ORDER — ENSURE ENLIVE PO LIQD
237.0000 mL | Freq: Two times a day (BID) | ORAL | Status: DC
  Administered 2021-01-05 (×2): 237 mL via ORAL

## 2021-01-04 MED ORDER — OXYCODONE HCL 5 MG PO TABS: 5.0000 mg | ORAL_TABLET | Freq: Once | ORAL | Status: DC | PRN

## 2021-01-04 MED ORDER — CHLORHEXIDINE GLUCONATE 0.12 % MT SOLN: 15.0000 mL | Freq: Once | OROMUCOSAL | Status: DC

## 2021-01-04 MED ORDER — CEFAZOLIN SODIUM-DEXTROSE 2-4 GM/100ML-% IV SOLN
2.0000 g | Freq: Four times a day (QID) | INTRAVENOUS | Status: AC
  Administered 2021-01-04 – 2021-01-05 (×2): 2 g via INTRAVENOUS
  Filled 2021-01-04 (×2): qty 100

## 2021-01-04 MED ORDER — ONDANSETRON HCL 4 MG/2ML IJ SOLN: 4.0000 mg | Freq: Four times a day (QID) | INTRAMUSCULAR | Status: DC | PRN

## 2021-01-04 MED ORDER — METOCLOPRAMIDE HCL 5 MG/ML IJ SOLN: 5.0000 mg | Freq: Three times a day (TID) | INTRAMUSCULAR | Status: DC | PRN

## 2021-01-04 MED ORDER — FENTANYL CITRATE (PF) 250 MCG/5ML IJ SOLN
INTRAMUSCULAR | Status: DC | PRN
  Administered 2021-01-04: 75 ug via INTRAVENOUS
  Administered 2021-01-04 (×2): 25 ug via INTRAVENOUS

## 2021-01-04 MED ORDER — FENTANYL CITRATE (PF) 250 MCG/5ML IJ SOLN
INTRAMUSCULAR | Status: AC
  Filled 2021-01-04: qty 5

## 2021-01-04 MED ORDER — DEXAMETHASONE SODIUM PHOSPHATE 10 MG/ML IJ SOLN
INTRAMUSCULAR | Status: AC
  Filled 2021-01-04: qty 1

## 2021-01-04 MED ORDER — OXYCODONE HCL 5 MG/5ML PO SOLN: 5.0000 mg | Freq: Once | ORAL | Status: DC | PRN

## 2021-01-04 MED ORDER — ADULT MULTIVITAMIN W/MINERALS CH
1.0000 | ORAL_TABLET | Freq: Every day | ORAL | Status: DC
  Administered 2021-01-05 – 2021-01-07 (×3): 1 via ORAL
  Filled 2021-01-04 (×4): qty 1

## 2021-01-04 MED ORDER — DEXAMETHASONE SODIUM PHOSPHATE 10 MG/ML IJ SOLN
INTRAMUSCULAR | Status: DC | PRN
  Administered 2021-01-04: 5 mg via INTRAVENOUS

## 2021-01-04 MED ORDER — PROSOURCE PLUS PO LIQD
30.0000 mL | Freq: Two times a day (BID) | ORAL | Status: DC
  Administered 2021-01-05 – 2021-01-07 (×4): 30 mL via ORAL
  Filled 2021-01-04 (×5): qty 30

## 2021-01-04 MED ORDER — CEFAZOLIN SODIUM-DEXTROSE 2-3 GM-%(50ML) IV SOLR
INTRAVENOUS | Status: DC | PRN
  Administered 2021-01-04: 2 g via INTRAVENOUS

## 2021-01-04 SURGICAL SUPPLY — 36 items
BAG COUNTER SPONGE SURGICOUNT (BAG) ×2 IMPLANT
BIT DRILL 4.8X200 CANN (BIT) ×2 IMPLANT
COVER PERINEAL POST (MISCELLANEOUS) ×2 IMPLANT
COVER SURGICAL LIGHT HANDLE (MISCELLANEOUS) ×2 IMPLANT
DRAPE STERI IOBAN 125X83 (DRAPES) ×2 IMPLANT
DRSG AQUACEL AG ADV 3.5X 4 (GAUZE/BANDAGES/DRESSINGS) ×2 IMPLANT
DURAPREP 26ML APPLICATOR (WOUND CARE) ×2 IMPLANT
ELECT REM PT RETURN 9FT ADLT (ELECTROSURGICAL) ×2
ELECTRODE REM PT RTRN 9FT ADLT (ELECTROSURGICAL) ×1 IMPLANT
FACESHIELD WRAPAROUND (MASK) ×2 IMPLANT
GLOVE SRG 8 PF TXTR STRL LF DI (GLOVE) ×1 IMPLANT
GLOVE SURG ENC MOIS LTX SZ8 (GLOVE) ×2 IMPLANT
GLOVE SURG ORTHO LTX SZ7.5 (GLOVE) ×2 IMPLANT
GLOVE SURG UNDER POLY LF SZ8 (GLOVE) ×1
GOWN STRL REUS W/ TWL LRG LVL3 (GOWN DISPOSABLE) ×1 IMPLANT
GOWN STRL REUS W/ TWL XL LVL3 (GOWN DISPOSABLE) ×2 IMPLANT
GOWN STRL REUS W/TWL LRG LVL3 (GOWN DISPOSABLE) ×1
GOWN STRL REUS W/TWL XL LVL3 (GOWN DISPOSABLE) ×2
GUIDE PIN DRILL TIP 2.8X450HIP (PIN) ×6
KIT TURNOVER KIT B (KITS) ×2 IMPLANT
NS IRRIG 1000ML POUR BTL (IV SOLUTION) ×2 IMPLANT
PACK GENERAL/GYN (CUSTOM PROCEDURE TRAY) ×2 IMPLANT
PAD ARMBOARD 7.5X6 YLW CONV (MISCELLANEOUS) ×4 IMPLANT
PIN GUIDE DRILL TIP 2.8X450HIP (PIN) ×3 IMPLANT
SCREW CANN  FULL THD 6.5X75 (Screw) ×1 IMPLANT
SCREW CANN FULL THD 6.5X75 (Screw) ×1 IMPLANT
SCREW CANN THRD 6.5X70 (Screw) ×4 IMPLANT
SCREW CANNULATED 6.5X65 KNEE (Screw) ×2 IMPLANT
SCREW FULLY THREADED 6.5X80 (Screw) ×2 IMPLANT
STAPLER VISISTAT 35W (STAPLE) ×2 IMPLANT
SUT VIC AB 0 CT1 27 (SUTURE) ×1
SUT VIC AB 0 CT1 27XBRD ANBCTR (SUTURE) ×1 IMPLANT
SUT VIC AB 2-0 CT1 27 (SUTURE) ×2
SUT VIC AB 2-0 CT1 TAPERPNT 27 (SUTURE) ×2 IMPLANT
TOWEL GREEN STERILE (TOWEL DISPOSABLE) ×2 IMPLANT
TOWEL GREEN STERILE FF (TOWEL DISPOSABLE) ×2 IMPLANT

## 2021-01-04 NOTE — Progress Notes (Signed)
Initial Nutrition Assessment  DOCUMENTATION CODES:   Not applicable  INTERVENTION:  Once diet is advanced to clear liquids or beyond: -PROSource PLUS PO BID, each supplement provides 100 kcals and 15 grams of protein -MVI with minerals daily  Once diet is advanced to full liquids or beyond: -Ensure Enlive po BID, each supplement provides 350 kcal and 20 grams of protein   NUTRITION DIAGNOSIS:   Increased nutrient needs related to post-op healing as evidenced by estimated needs.  GOAL:   Patient will meet greater than or equal to 90% of their needs  MONITOR:   PO intake, Supplement acceptance, Diet advancement, Labs, Weight trends, I & O's  REASON FOR ASSESSMENT:   Consult Hip fracture protocol  ASSESSMENT:   Pt with PMH significant for dementia, Alzheimer disease, HTN, and HLD admitted with L femoral neck fx s/p unwitnessed mechanical fall.  Pt transferred to [facility] to undergo intramedullary nail placement surgery as requested by pt's POA, her daughter. Per MD, pt's daughter has been made aware that pt will remain at high risk for encephalopathy and other adverse outcomes postoperatively and most likely will note be able to work with PT given her dementia and this will likely limit/impact her recovery. Pt's daughter agreeable to proceed with procedure as pt has done well after her surgery in March 2021. Note pt with contusion with petechial hemorrhage; Neurosurgery evaluated and not currently recommending any intervention.   Pt out of room at time of RD visit. Per RN, pt headed to OR to have cannulated screw fixation of the L hip fx. Pt is NPO as a result, but recommend providing pt with oral nutrition supplements once diet is advanced 2/2 increased nutrient needs.   Pt has dementia and is a resident at [facility]. Pt is a poor historian 2/2 dementia. Per H&P, pt had 1 episode of vomiting on day of admission. No other emesis has been documented/noted. At baseline, pt is  able to feed and dress herself, could ambulate with assistance, but mostly spends the day in bed. With this being said, BLE likely to have more significant muscle depletions 2/2 inactivity.   No significant weight changes noted upon review of wt hx.  No PO Intake documented.   No UOP documented x24 hours  Medications: colace, protonix, miralax  Labs reviewed.  NUTRITION - FOCUSED PHYSICAL EXAM: Unable to perform at this time. Will attempt at follow-up.   Diet Order:   Diet Order             Diet NPO time specified  Diet effective midnight                   EDUCATION NEEDS:   No education needs have been identified at this time  Skin:  Skin Assessment: Reviewed RN Assessment  Last BM:  11/7  Height:   Ht Readings from Last 1 Encounters:  01/04/21 5\' 2"  (1.575 m)    Weight:   Wt Readings from Last 2 Encounters:  01/04/21 54.4 kg  05/02/19 55.2 kg   Ideal Body Weight:  61.36 kg  BMI:  Body mass index is 21.95 kg/m.  Estimated Nutritional Needs:   Kcal:  1400-1600  Protein:  70-80 grams  Fluid:  >1.4L    07/02/19, MS, RD, LDN (she/her/hers) RD pager number and weekend/on-call pager number located in Amion.

## 2021-01-04 NOTE — Progress Notes (Signed)
Tried calling contact persons to get consent for surgery; [facility] [name]on[facility] no. [phone]) went straight to voicemail; left message ;awaiting for reply & Clydie Braun Antis [phone]) seems like a wrong number.

## 2021-01-04 NOTE — Anesthesia Preprocedure Evaluation (Signed)
Anesthesia Evaluation  Patient identified by MRN, date of birth, ID band Patient awake    Reviewed: Allergy & Precautions, H&P , NPO status , Patient's Chart, lab work & pertinent test results  Airway Mallampati: III   Neck ROM: full    Dental   Pulmonary former smoker,    breath sounds clear to auscultation       Cardiovascular hypertension,  Rhythm:regular Rate:Normal     Neuro/Psych PSYCHIATRIC DISORDERS Dementia    GI/Hepatic   Endo/Other  Hypothyroidism   Renal/GU      Musculoskeletal   Abdominal   Peds  Hematology   Anesthesia Other Findings   Reproductive/Obstetrics                             Anesthesia Physical Anesthesia Plan  ASA: 3  Anesthesia Plan: General   Post-op Pain Management:    Induction: Intravenous  PONV Risk Score and Plan: 3 and Ondansetron, Treatment may vary due to age or medical condition and Dexamethasone  Airway Management Planned: Oral ETT  Additional Equipment:   Intra-op Plan:   Post-operative Plan: Extubation in OR  Informed Consent: I have reviewed the patients History and Physical, chart, labs and discussed the procedure including the risks, benefits and alternatives for the proposed anesthesia with the patient or authorized representative who has indicated his/her understanding and acceptance.     Dental advisory given  Plan Discussed with: CRNA, Anesthesiologist and Surgeon  Anesthesia Plan Comments:         Anesthesia Quick Evaluation

## 2021-01-04 NOTE — Progress Notes (Signed)
PROGRESS NOTE  [name] WUJ:[id number] DOB: 1922/07/25 DOA: 01/03/2021 PCP: Almetta Lovely, [facility]  Brief History   The patient is a 87 yr old woman who presented after a witnessed fall. She is from [employer]. The fall was preceded by an episode of vomiting and incontinence of bladder.  There was no seiure like events. There is no recent change in medication. No previous nausea or vomiting. No frequent falls. There is no reported chest pain, fever, diarrhea, or strokelike symptoms prior to fall.  She carries a medical history if dementia, HTN, HLD, Hypothyroidism.   In the [facility] ED the patient was found to have suffered a fracture of the left femoral neck, intracranial contusion with petechial hemorrhage.  The patient has been transferred to [facility] for further evaluation and treatment.  Neurosurgery has evaluated the patient and recommends no surgical intervention. The patient has also been evaluated by orthopedic surgery. The plan is for intramedullary nail placement.  Consultants  Orthopedic surgery Neurosurgery  Procedures  None  Antibiotics   Anti-infectives (From admission, onward)    Start     Dose/Rate Route Frequency Ordered Stop   01/04/21 2300  ceFAZolin (ANCEF) IVPB 2g/100 mL premix        2 g 200 mL/hr over 30 Minutes Intravenous Every 6 hours 01/04/21 1859 01/05/21 0435      Subjective  The patient is resting comfortably. No new complaints.  Objective   Vitals:  Vitals:   01/04/21 1817 01/04/21 1832  BP: (!) 134/58 (!) 149/87  Pulse: 95 (!) 109  Resp: 16 16  Temp:    SpO2: 92% 93%    Exam:  Constitutional:  The patient is awake and alert. No acute distress Eyes:  pupils and irises appear normal Normal lids and conjunctivae Respiratory:  CTA bilaterally, no w/r/r.  Respiratory effort normal. No retractions or accessory muscle use Cardiovascular:  RRR, no m/r/g No LE extremity edema   Normal pedal pulses Abdomen:  Abdomen appears  normal; no tenderness or masses No hernias No HSM Musculoskeletal:  Digits/nails BUE: no clubbing, cyanosis, petechiae, infection Skin:  No rashes, lesions, ulcers palpation of skin: no induration or nodules Neurologic:  CN 2-12 intact Sensation all 4 extremities intact Psychiatric:  Patient is pleasantly confused. I have personally reviewed the following:   Today's Data  Vitals  Lab Data  CBC, BMP=  Imaging  DG pelvis, abdomen CT hip CT head  Scheduled Meds:  [MAR Hold] (feeding supplement) PROSource Plus  30 mL Oral BID BM   [MAR Hold] aspirin  325 mg Oral Q breakfast   chlorhexidine  15 mL Mouth/Throat Once   Or   mouth rinse  15 mL Mouth Rinse Once   [MAR Hold] docusate sodium  100 mg Oral BID   [MAR Hold] feeding supplement  237 mL Oral BID BM   [MAR Hold] levothyroxine  12.5 mcg Oral QODAY   [MAR Hold] multivitamin with minerals  1 tablet Oral Daily   [MAR Hold] pantoprazole  40 mg Oral Daily   [MAR Hold] polyethylene glycol  17 g Oral Daily   [MAR Hold] pravastatin  10 mg Oral QPM   [MAR Hold] traZODone  50 mg Oral QHS   Continuous Infusions:  lactated ringers 50 mL/hr at 01/04/21 1542    Principal Problem:   Fracture of femoral neck, left, closed (HCC) Active Problems:   Essential hypertension   Dementia without behavioral disturbance (HCC)   Hyperlipidemia   Contusion with petechial hemorrhage  Scalp hematoma, initial encounter   Leucocytosis   Hypothyroidism   Vomiting   Unwitnessed fall   LOS: 1 day   A & P  Fracture of femoral neck, left, closed (HCC) Presents with unwitnessed fall.  Does not have any significant pain.  CT scan confirms presence of femoral neck fracture on the left. Orthopedic consulted.  Currently recommending intramedullary nail placement surgery. Patient will be transferred to [facility] to facilitate this. Discussed with daughter she currently consenting and would like to proceed with the surgery. Recommended the  patient will remain at high risk for encephalopathy postoperatively and most likely with her dementia may not be able to work with physical therapy which is a key part of recovery.  She understands it.  Patient actually had a surgery 2021 March and so far has done well per daughter.   Unwitnessed fall Etiology of the fall is not clear.  For now we will monitor clinically.   Scalp hematoma, initial encounter Unwitnessed fall.  No bleeding so far.  Monitor.   Contusion with petechial hemorrhage Presents with an unwitnessed fall. CT scan shows evidence of contusion as well as petechial hemorrhage on the left. Neurosurgery was consulted based on the evaluation does not recommend any procedure or intervention. No further imaging recommendation as well. No focal deficit other than chronic dementia at the time of my evaluation.   Vomiting Per staff at [facility], patient had episode of vomiting as well as urination.  At the time of the fall although denies any seizure-like episode. For now we will monitor.  No focal deficit.   Leucocytosis Secondary to fall and fracture.  Monitor.  Currently no evidence of acute infection.   Essential hypertension Blood pressure currently normal.  We will hold antihypertensive medication and just provide IV fluids   Dementia without behavioral disturbance (HCC) Placing the patient at significant risk for poor outcomes postoperatively given her dementia. After her recent surgery per daughter patient actually has progressive decline in her ability to ambulate although at her baseline she is able to feed herself, dress herself and ambulate with assistance but mostly spends the day in bed. Patient may not be able to follow PT recommendation and nutrition guidance. Daughter is aware.   Hyperlipidemia Continue statin   Hypothyroidism Continue Synthroid   Preoperative medical evaluation No Coronary revascularization/CVA within 5 years. No Recent stress  test Does not Climb flight of stair, participates in recreational activity,does household chores. No Prior adverse event with anesthesia. No Alcohol use, drug use.   A) Cardiac risk: Based on RCRI  With this the patient is a moderate risk for adverse Cardiac outcome from surgery.   B) Pulmonary risk: No history of COPD or sleep apnea.   C) Bleeding risk Currently on aspirin and SCD.  I have seen and examined this patient myself. I have spent 34 minutes in her evaluation and care.     Code Status: DNR   DVT Prophylaxis:   SCDs Start: 01/03/21 1535 Place TED hose Start: 01/03/21 1535   Family Communication: Discussed with daughter on the phone.  She will be here tomorrow afternoon.  Hoping that she will receive a call from the orthopedic surgeon. Admission status: Inpatient Med-Surg   Hetal Proano, DO [facility] Direct contact: see [email or web address]  7PM-7AM contact night coverage as above 01/04/2021, 6:39 PM  LOS: 1 day

## 2021-01-04 NOTE — TOC CAGE-AID Note (Signed)
Transition of Care [facility]) - CAGE-AID Screening   Patient Details  Name: [name] MRN: [id number] Date of Birth: 02/25/1923  Transition of Care [facility]) CM/SW Contact:    Apolonia Ellwood C Tarpley-Carter, LCSWA Phone Number: 01/04/2021, 10:06 AM   Clinical Narrative: Pt is unable to participate in Cage Aid.  Pt has dementia.  Luay Balding Tarpley-Carter, MSW, LCSW-A Pronouns:  She/Her/Hers [facility] Clinical Social Worker Direct Number:  [phone] [email or web address]  CAGE-AID Screening: Substance Abuse Screening unable to be completed due to: : Patient unable to participate (Pt has dementia.)             Substance Abuse Education Offered: No

## 2021-01-04 NOTE — Anesthesia Procedure Notes (Signed)
Procedure Name: Intubation Date/Time: 01/04/2021 4:57 PM Performed by: Reece Agar, CRNA Pre-anesthesia Checklist: Patient identified, Emergency Drugs available, Suction available and Patient being monitored Patient Re-evaluated:Patient Re-evaluated prior to induction Oxygen Delivery Method: Circle System Utilized Preoxygenation: Pre-oxygenation with 100% oxygen Induction Type: IV induction Ventilation: Mask ventilation without difficulty Laryngoscope Size: Mac and 3 Grade View: Grade I Tube type: Oral Tube size: 7.0 mm Number of attempts: 1 Airway Equipment and Method: Stylet Placement Confirmation: ETT inserted through vocal cords under direct vision, positive ETCO2 and breath sounds checked- equal and bilateral Secured at: 21 cm Tube secured with: Tape Dental Injury: Teeth and Oropharynx as per pre-operative assessment

## 2021-01-04 NOTE — Brief Op Note (Signed)
01/04/2021  5:46 PM  PATIENT:  Daphene Jaeger  87 y.o. female  PRE-OPERATIVE DIAGNOSIS:  Left Femur Neck Fracture  POST-OPERATIVE DIAGNOSIS:  Left Femur Neck Fracture  PROCEDURE:  Procedure(s): CANNULATED HIP PINNING (Left)  SURGEON:  Surgeon(s) and Role:    Kathryne Hitch, MD - Primary  PHYSICIAN ASSISTANT: Rexene Edison, PA-C  ANESTHESIA:   general  COUNTS:  YES  DICTATION: .Other Dictation: Dictation Number [id number]  PLAN OF CARE: Admit for overnight observation  PATIENT DISPOSITION:  PACU - hemodynamically stable.   Delay start of Pharmacological VTE agent (>24hrs) due to surgical blood loss or risk of bleeding: no

## 2021-01-04 NOTE — Consult Note (Signed)
Reason for Consult:  Left hip femoral neck fracture Referring Physician: ED  [name] is an 87 y.o. female.  HPI: The patient is a 87 year old female with dementia who sustained some type of mechanical fall injuring her left hip.  She was seen at the [facility] emergency room and found to have a nondisplaced left hip femoral neck fracture.  Surgery has been requested by the patient's healthcare power of attorney given the fact that she had a similar fracture last year that we treated with cannulated screws.  She does ambulate.  Apparently after she fell she was able to ambulate on this left hip but experienced pain that is necessitating a trip to the emergency room where the fracture was found.  I did just see at bedside this morning.  She also restraints given her dementia.  Past Medical History:  Diagnosis Date   Alzheimer disease (HCC)    Dementia (HCC)    Essential hypertension    Hyperlipidemia     Past Surgical History:  Procedure Laterality Date   HIP PINNING,CANNULATED Right 05/03/2019   Procedure: CANNULATED HIP PINNING,dressing change right elbow;  Surgeon: Kathryne Hitch, MD;  Location: [facility] ORS;  Service: Orthopedics;  Laterality: Right;    History reviewed. No pertinent family history.  Social History:  reports that she has quit smoking. Her smoking use included cigarettes. She smoked an average of .25 packs per day. She has never used smokeless tobacco. She reports that she does not drink alcohol and does not use drugs.  Allergies:  Allergies  Allergen Reactions   Penicillins Other (See Comments)    Unknown Tolerates Keflex    Medications: I have reviewed the patient's current medications.  Results for orders placed or performed during the hospital encounter of 01/03/21 (from the past 48 hour(s))  CBC with Differential/Platelet     Status: Abnormal   Collection Time: 01/03/21  1:34 PM  Result Value Ref Range   WBC 13.9 (H) 4.0 - 10.5 K/uL   RBC 4.67 3.87  - 5.11 MIL/uL   Hemoglobin 13.9 12.0 - 15.0 g/dL   HCT 11.7 35.6 - 70.1 %   MCV 90.6 80.0 - 100.0 fL   MCH 29.8 26.0 - 34.0 pg   MCHC 32.9 30.0 - 36.0 g/dL   RDW 41.0 30.1 - 31.4 %   Platelets 271 150 - 400 K/uL   nRBC 0.0 0.0 - 0.2 %   Neutrophils Relative % 76 %   Neutro Abs 10.6 (H) 1.7 - 7.7 K/uL   Lymphocytes Relative 15 %   Lymphs Abs 2.0 0.7 - 4.0 K/uL   Monocytes Relative 7 %   Monocytes Absolute 0.9 0.1 - 1.0 K/uL   Eosinophils Relative 1 %   Eosinophils Absolute 0.1 0.0 - 0.5 K/uL   Basophils Relative 0 %   Basophils Absolute 0.1 0.0 - 0.1 K/uL   Immature Granulocytes 1 %   Abs Immature Granulocytes 0.11 (H) 0.00 - 0.07 K/uL    Comment: Performed at [facility], [address], [place], [place] [address]  Comprehensive metabolic panel     Status: Abnormal   Collection Time: 01/03/21  1:34 PM  Result Value Ref Range   Sodium 140 135 - 145 mmol/L   Potassium 3.5 3.5 - 5.1 mmol/L   Chloride 105 98 - 111 mmol/L   CO2 27 22 - 32 mmol/L   Glucose, Bld 101 (H) 70 - 99 mg/dL    Comment: Glucose reference range applies  only to samples taken after fasting for at least 8 hours.   BUN 29 (H) 8 - 23 mg/dL   Creatinine, Ser 4.01 0.44 - 1.00 mg/dL   Calcium 9.0 8.9 - 02.7 mg/dL   Total Protein 7.4 6.5 - 8.1 g/dL   Albumin 4.1 3.5 - 5.0 g/dL   AST 27 15 - 41 U/L   ALT 20 0 - 44 U/L   Alkaline Phosphatase 79 38 - 126 U/L   Total Bilirubin 1.8 (H) 0.3 - 1.2 mg/dL   GFR, Estimated 55 (L) >60 mL/min    Comment: (NOTE) Calculated using the CKD-EPI Creatinine Equation (2021)    Anion gap 8 5 - 15    Comment: Performed at [facility], [address] [place], [place] [address]  Resp Panel by RT-PCR (Flu A&B, Covid) Nasopharyngeal Swab     Status: None   Collection Time: 01/03/21  1:50 PM   Specimen: Nasopharyngeal Swab; Nasopharyngeal(NP) swabs in vial transport medium  Result Value Ref Range   SARS Coronavirus 2 by RT PCR NEGATIVE NEGATIVE     Comment: (NOTE) SARS-CoV-2 target nucleic acids are NOT DETECTED.  The SARS-CoV-2 RNA is generally detectable in upper respiratory specimens during the acute phase of infection. The lowest concentration of SARS-CoV-2 viral copies this assay can detect is 138 copies/mL. A negative result does not preclude SARS-Cov-2 infection and should not be used as the sole basis for treatment or other patient management decisions. A negative result may occur with  improper specimen collection/handling, submission of specimen other than nasopharyngeal swab, presence of viral mutation(s) within the areas targeted by this assay, and inadequate number of viral copies(<138 copies/mL). A negative result must be combined with clinical observations, patient history, and epidemiological information. The expected result is Negative.  Fact Sheet for Patients:  BloggerCourse.com  Fact Sheet for Healthcare Providers:  SeriousBroker.it  This test is no t yet approved or cleared by the [place] FDA and  has been authorized for detection and/or diagnosis of SARS-CoV-2 by FDA under an Emergency Use Authorization (EUA). This EUA will remain  in effect (meaning this test can be used) for the duration of the COVID-19 declaration under Section 564(b)(1) of the Act, 21 U.S.C.section 360bbb-3(b)(1), unless the authorization is terminated  or revoked sooner.       Influenza A by PCR NEGATIVE NEGATIVE   Influenza B by PCR NEGATIVE NEGATIVE    Comment: (NOTE) The Xpert Xpress SARS-CoV-2/FLU/RSV plus assay is intended as an aid in the diagnosis of influenza from Nasopharyngeal swab specimens and should not be used as a sole basis for treatment. Nasal washings and aspirates are unacceptable for Xpert Xpress SARS-CoV-2/FLU/RSV testing.  Fact Sheet for Patients: BloggerCourse.com  Fact Sheet for Healthcare  Providers: SeriousBroker.it  This test is not yet approved or cleared by the [place] FDA and has been authorized for detection and/or diagnosis of SARS-CoV-2 by FDA under an Emergency Use Authorization (EUA). This EUA will remain in effect (meaning this test can be used) for the duration of the COVID-19 declaration under Section 564(b)(1) of the Act, 21 U.S.C. section 360bbb-3(b)(1), unless the authorization is terminated or revoked.  Performed at [facility], [address], [place], [place] [address]   Basic metabolic panel     Status: Abnormal   Collection Time: 01/04/21  5:05 AM  Result Value Ref Range   Sodium 137 135 - 145 mmol/L   Potassium 3.7 3.5 - 5.1 mmol/L   Chloride  103 98 - 111 mmol/L   CO2 24 22 - 32 mmol/L   Glucose, Bld 96 70 - 99 mg/dL    Comment: Glucose reference range applies only to samples taken after fasting for at least 8 hours.   BUN 20 8 - 23 mg/dL   Creatinine, Ser 0.07 0.44 - 1.00 mg/dL   Calcium 8.7 (L) 8.9 - 10.3 mg/dL   GFR, Estimated >62 >26 mL/min    Comment: (NOTE) Calculated using the CKD-EPI Creatinine Equation (2021)    Anion gap 10 5 - 15    Comment: Performed at [facility], [address], [place], [place] [address]  CBC     Status: Abnormal   Collection Time: 01/04/21  5:05 AM  Result Value Ref Range   WBC 11.7 (H) 4.0 - 10.5 K/uL   RBC 4.25 3.87 - 5.11 MIL/uL   Hemoglobin 12.4 12.0 - 15.0 g/dL   HCT 56.2 56.3 - 89.3 %   MCV 90.4 80.0 - 100.0 fL   MCH 29.2 26.0 - 34.0 pg   MCHC 32.3 30.0 - 36.0 g/dL   RDW 73.4 28.7 - 68.1 %   Platelets 238 150 - 400 K/uL   nRBC 0.0 0.0 - 0.2 %    Comment: Performed at [facility], [address], [place], [place] [address]    DG Chest 2 View  Result Date: 01/03/2021 CLINICAL DATA:  Larey Seat with shortness of breath. EXAM: CHEST - 2 VIEW COMPARISON:  05/08/2019 FINDINGS: Patient has taken a poor inspiration. Heart size is normal.  There is chronic aortic atherosclerotic calcification. Lung markings appear prominent, felt most likely due to the poor inspiration. I cannot rule out a degree fluid overload or early edema. No focal infiltrate, collapse or effusion. IMPRESSION: Poor inspiration. Prominent lung markings felt most likely secondary to that. Cannot rule out a degree of fluid overload/early edema. Electronically Signed   By: Paulina Fusi M.D.   On: 01/03/2021 12:12   CT Head Wo Contrast  Result Date: 01/03/2021 CLINICAL DATA:  Trauma, fall EXAM: CT HEAD WITHOUT CONTRAST TECHNIQUE: Contiguous axial images were obtained from the base of the skull through the vertex without intravenous contrast. COMPARISON:  05/08/2019 FINDINGS: Brain: In the image twenty-five of series 2, there is 6 mm focus of subtle increased density in the anteromedial left frontal cortex. There is no demonstrable high density in the subarachnoid or extra-axial location in the same region. In the previous study, there was large area of parenchymal and subarachnoid hemorrhage in the anteromedial frontal lobes on both sides, more so on the left side. In image 14, there is 6 mm ring-like calcification in the the floor of right anterior cranial fossa which appears stable. Ventricles are not dilated. There is no shift of midline structures. There is prominence of cortical sulci. There is decreased density in subcortical and periventricular white matter. Possible small old lacunar infarct is seen in the left basal ganglia. Vascular: There are scattered arterial calcifications. Skull: No fracture is seen in calvarium. There is subcutaneous contusion/hematoma in the left frontal scalp. Sinuses/Orbits: Unremarkable Other: None IMPRESSION: There is 6 mm focus of subtle increased density in the anteromedial left frontal cortex, possibly contusion with petechial hemorrhage. There is no demonstrable subarachnoid or extra-axial fluid collection. Short-term follow-up CT  examination as clinically warranted should be considered. There is subcutaneous contusion/hematoma in the left frontal scalp. There is 6 mm ring-like calcific density along the floor of the right anterior cranial fossa,  possibly dystrophic dural calcification or small meningioma. Atrophy.  Small-vessel disease. Electronically Signed   By: Ernie Avena M.D.   On: 01/03/2021 12:39   CT Hip Left Wo Contrast  Result Date: 01/03/2021 CLINICAL DATA:  Hip trauma, fracture suspected. Possible subcapital fracture on radiographs. EXAM: CT OF THE LEFT HIP WITHOUT CONTRAST TECHNIQUE: Multidetector CT imaging of the left hip was performed according to the standard protocol. Multiplanar CT image reconstructions were also generated. COMPARISON:  Radiographs 01/03/2021 and 05/19/2019 FINDINGS: Bones/Joint/Cartilage There is a mildly impacted subcapital fracture of the left femoral neck, best seen on the reformatted images. The femoral head is located. There is no significant hip arthropathy. A small left hip joint effusion is noted. The visualized inferior left hemipelvis appears unremarkable. Ligaments Suboptimally assessed by CT. Muscles and Tendons Unremarkable. Soft tissues No periarticular hematoma, soft tissue emphysema or unexpected foreign body. Moderate iliofemoral atherosclerosis noted. There are diverticular changes within the distal colon. IMPRESSION: 1. CT confirms the presence of a mildly impacted subcapital fracture of the left femoral neck. 2. No dislocation or significant underlying hip arthropathy. Electronically Signed   By: Carey Bullocks M.D.   On: 01/03/2021 13:24   DG Shoulder Left  Result Date: 01/03/2021 CLINICAL DATA:  Fall EXAM: LEFT SHOULDER - 2+ VIEW COMPARISON:  None. FINDINGS: There is no evidence of acute fracture or dislocation. There is mild glenohumeral and moderate AC joint degenerative change. IMPRESSION: No acute fracture or dislocation. Electronically Signed   By: Caprice Renshaw  M.D.   On: 01/03/2021 12:16   DG Hip Unilat W or Wo Pelvis 2-3 Views Left  Result Date: 01/03/2021 CLINICAL DATA:  fall, some sob EXAM: DG HIP (WITH OR WITHOUT PELVIS) 2-3V LEFT COMPARISON:  Radiograph 05/19/2019 FINDINGS: Prior right femoral neck fixation. Intact hardware without evidence of loosening. There is possible femoral head neck offset on the lateral view versus is prominent osteophyte formation. Minimal bilateral hip degenerative changes. Vascular calcifications. IMPRESSION: Possible left-sided subcapital femoral neck fracture.  Recommend CT. Electronically Signed   By: Caprice Renshaw M.D.   On: 01/03/2021 12:15    Review of Systems Blood pressure 134/78, pulse 80, temperature 98.5 F (36.9 C), temperature source Oral, resp. rate 17, SpO2 96 %. Physical Exam Vitals reviewed.  Musculoskeletal:     Left hip: Bony tenderness present.  Neurological:     Mental Status: She is alert. Mental status is at baseline.   The left lower extremity is not shortened and the patient does seem to tolerate gentle rotation of the hip on the left side.  Assessment/Plan: Left hip with nondisplaced femoral neck fracture  The plan will be to proceed to the operating room later today for cannulated screw fixation of the left hip fracture.  I believe this is the best option given her situation right now.  Obviously her age and comorbidities there are risks involved and this will be explained to the healthcare power of attorney in order to obtain appropriate consent.  She is n.p.o.  Kathryne Hitch 01/04/2021, 7:47 AM

## 2021-01-04 NOTE — Transfer of Care (Signed)
Immediate Anesthesia Transfer of Care Note  Patient: [name]  Procedure(s) Performed: CANNULATED HIP PINNING (Left: Hip)  Patient Location: PACU  Anesthesia Type:General  Level of Consciousness: drowsy  Airway & Oxygen Therapy: Patient Spontanous Breathing and Patient connected to face mask oxygen  Post-op Assessment: Report given to RN and Post -op Vital signs reviewed and stable  Post vital signs: Reviewed and stable  Last Vitals:  Vitals Value Taken Time  BP 145/71 01/04/21 1802  Temp    Pulse 115 01/04/21 1807  Resp 20 01/04/21 1807  SpO2 98 % 01/04/21 1807  Vitals shown include unvalidated device data.  Last Pain:  Vitals:   01/04/21 1254  TempSrc: Oral         Complications: No notable events documented.

## 2021-01-05 DIAGNOSIS — S72002A Fracture of unspecified part of neck of left femur, initial encounter for closed fracture: Secondary | ICD-10-CM | POA: Diagnosis not present

## 2021-01-05 DIAGNOSIS — S06330D Contusion and laceration of cerebrum, unspecified, without loss of consciousness, subsequent encounter: Secondary | ICD-10-CM | POA: Diagnosis not present

## 2021-01-05 DIAGNOSIS — F0390 Unspecified dementia without behavioral disturbance: Secondary | ICD-10-CM | POA: Diagnosis not present

## 2021-01-05 DIAGNOSIS — I10 Essential (primary) hypertension: Secondary | ICD-10-CM | POA: Diagnosis not present

## 2021-01-05 LAB — BASIC METABOLIC PANEL
Anion gap: 11 (ref 5–15)
BUN: 16 mg/dL (ref 8–23)
CO2: 23 mmol/L (ref 22–32)
Calcium: 8.2 mg/dL — ABNORMAL LOW (ref 8.9–10.3)
Chloride: 100 mmol/L (ref 98–111)
Creatinine, Ser: 0.8 mg/dL (ref 0.44–1.00)
GFR, Estimated: >60 mL/min (ref >60)
Glucose, Bld: 125 mg/dL — ABNORMAL HIGH (ref 70–99)
Potassium: 3.9 mmol/L (ref 3.5–5.1)
Sodium: 134 mmol/L — ABNORMAL LOW (ref 135–145)

## 2021-01-05 LAB — CBC
HCT: 36 % (ref 36.0–46.0)
Hemoglobin: 11.6 g/dL — ABNORMAL LOW (ref 12.0–15.0)
MCH: 29.4 pg (ref 26.0–34.0)
MCHC: 32.2 g/dL (ref 30.0–36.0)
MCV: 91.4 fL (ref 80.0–100.0)
Platelets: 215 10*3/uL (ref 150–400)
RBC: 3.94 MIL/uL (ref 3.87–5.11)
RDW: 12.6 % (ref 11.5–15.5)
WBC: 12.3 10*3/uL — ABNORMAL HIGH (ref 4.0–10.5)
nRBC: 0 % (ref 0.0–0.2)

## 2021-01-05 NOTE — Progress Notes (Signed)
PROGRESS NOTE  [name] QPY:[id number] DOB: 09-Jan-1923 DOA: 01/03/2021 PCP: Almetta Lovely, [facility]  Brief History   The patient is a 87 yr old woman who presented after a witnessed fall. She is from [employer]. The fall was preceded by an episode of vomiting and incontinence of bladder.  There was no seiure like events. There is no recent change in medication. No previous nausea or vomiting. No frequent falls. There is no reported chest pain, fever, diarrhea, or strokelike symptoms prior to fall.  She carries a medical history if dementia, HTN, HLD, Hypothyroidism.   In the [facility] ED the patient was found to have suffered a fracture of the left femoral neck, intracranial contusion with petechial hemorrhage.  The patient has been transferred to [facility] for further evaluation and treatment.  Neurosurgery has evaluated the patient and recommends no surgical intervention. The patient has also been evaluated by orthopedic surgery. The plan is for intramedullary nail placement.  The patient underwent cannulated screw fixation of left hip femoral neck fracture on the morning of 01/05/2021. She has tolerated the procedure well.  Consultants  Orthopedic surgery Neurosurgery  Procedures  None  Antibiotics   Anti-infectives (From admission, onward)    Start     Dose/Rate Route Frequency Ordered Stop   01/04/21 2300  ceFAZolin (ANCEF) IVPB 2g/100 mL premix        2 g 200 mL/hr over 30 Minutes Intravenous Every 6 hours 01/04/21 1859 01/05/21 0435      Subjective  The patient is resting comfortably. No new complaints. She is somnolent.  Objective   Vitals:  Vitals:   01/05/21 0817 01/05/21 1215  BP: 94/68 (!) 104/50  Pulse:  76  Resp: 17 17  Temp: 98.2 F (36.8 C) 98.1 F (36.7 C)  SpO2: 91% 97%    Exam:  Constitutional:  The patient is somnolent, but responds to stimuli. No acute distress. Respiratory:  CTA bilaterally, no w/r/r.  Respiratory effort normal. No  retractions or accessory muscle use Cardiovascular:  RRR, no m/r/g No LE extremity edema   Normal pedal pulses Abdomen:  Abdomen appears normal; no tenderness or masses No hernias No HSM Musculoskeletal:  Digits/nails BUE: no clubbing, cyanosis, petechiae, infection Skin:  No rashes, lesions, ulcers palpation of skin: no induration or nodules Neurologic:  CN 2-12 intact Sensation all 4 extremities intact Psychiatric:  Patient is pleasantly confused. I have personally reviewed the following:   Today's Data  Vitals  Lab Data  CBC, BMP  Imaging  DG pelvis, abdomen CT hip CT head  Scheduled Meds:  (feeding supplement) PROSource Plus  30 mL Oral BID BM   aspirin  325 mg Oral Q breakfast   docusate sodium  100 mg Oral BID   feeding supplement  237 mL Oral BID BM   levothyroxine  12.5 mcg Oral QODAY   multivitamin with minerals  1 tablet Oral Daily   pantoprazole  40 mg Oral Daily   polyethylene glycol  17 g Oral Daily   pravastatin  10 mg Oral QPM   traZODone  50 mg Oral QHS   Continuous Infusions:  lactated ringers 50 mL/hr at 01/05/21 1109    Principal Problem:   Fracture of femoral neck, left, closed (HCC) Active Problems:   Essential hypertension   Dementia without behavioral disturbance (HCC)   Hyperlipidemia   Contusion with petechial hemorrhage   Scalp hematoma, initial encounter   Leucocytosis   Hypothyroidism   Vomiting   Unwitnessed fall  LOS: 2 days   A & P  Fracture of femoral neck, left, closed (HCC) Presents with unwitnessed fall.  Does not have any significant pain.  CT scan confirms presence of femoral neck fracture on the left. Orthopedic consulted.  Currently recommending intramedullary nail placement surgery. Patient will be transferred to [facility] to facilitate this. Discussed with daughter she currently consenting and would like to proceed with the surgery. Recommended the patient will remain at high risk for encephalopathy  postoperatively and most likely with her dementia may not be able to work with physical therapy which is a key part of recovery.  She understands it.  Patient actually had a surgery 2021 March and so far has done well per daughter.  S/P cannulated screw fixation of left hip femoral neck fracture on the morning of 01/05/2021. The patient has tolerated the procedure well.   Unwitnessed fall Etiology of the fall is not clear.  For now we will monitor clinically.   Scalp hematoma, initial encounter Unwitnessed fall.  No bleeding so far.  Monitor.   Contusion with petechial hemorrhage Presents with an unwitnessed fall. CT scan shows evidence of contusion as well as petechial hemorrhage on the left. Neurosurgery was consulted based on the evaluation does not recommend any procedure or intervention. No further imaging recommendation as well. No focal deficit other than chronic dementia at the time of my evaluation.   Vomiting Per staff at [facility], patient had episode of vomiting as well as urination.  At the time of the fall although denies any seizure-like episode. For now we will monitor.  No focal deficit.   Leucocytosis Secondary to fall and fracture.  Monitor.  Currently no evidence of acute infection.   Essential hypertension Blood pressure currently normal.  We will hold antihypertensive medication and just provide IV fluids   Dementia without behavioral disturbance (HCC) Placing the patient at significant risk for poor outcomes postoperatively given her dementia. After her recent surgery per daughter patient actually has progressive decline in her ability to ambulate although at her baseline she is able to feed herself, dress herself and ambulate with assistance but mostly spends the day in bed. Patient may not be able to follow PT recommendation and nutrition guidance. Daughter is aware.   Hyperlipidemia Continue statin   Hypothyroidism Continue Synthroid   Preoperative  medical evaluation No Coronary revascularization/CVA within 5 years. No Recent stress test Does not Climb flight of stair, participates in recreational activity,does household chores. No Prior adverse event with anesthesia. No Alcohol use, drug use.   A) Cardiac risk: Based on RCRI  With this the patient is a moderate risk for adverse Cardiac outcome from surgery.   B) Pulmonary risk: No history of COPD or sleep apnea.   C) Bleeding risk Currently on aspirin and SCD.  I have seen and examined this patient myself. I have spent 34 minutes in her evaluation and care.     Code Status: DNR   DVT Prophylaxis:   SCDs Start: 01/03/21 1535 Place TED hose Start: 01/03/21 1535   Family Communication: None available Admission status: Inpatient Med-Surg   Michaele Amundson, DO [facility] Direct contact: see [email or web address]  7PM-7AM contact night coverage as above 01/05/2021, 3:24 PM  LOS: 1 day

## 2021-01-05 NOTE — Op Note (Signed)
[name], [name] MEDICAL RECORD NO: [id number] ACCOUNT NO: [id number] DATE OF BIRTH: [birth year] FACILITY: [facility] LOCATION: [facility] PHYSICIAN: Vanita Panda. Magnus Ivan, MD  Operative Report   DATE OF PROCEDURE: 01/04/2021  PREOPERATIVE DIAGNOSIS:  Nondisplaced left hip femoral neck fracture.  POSTOPERATIVE DIAGNOSIS:  Nondisplaced left hip femoral neck fracture.  PROCEDURE:  Cannulated screw fixation of left hip femoral neck fracture.  IMPLANTS:  Biomet/Zimmer 6.5 mm cannulated screws x 3.  SURGEON:  Doneen Poisson, M.D.  ASSISTANT:  Rexene Edison, PA-C.  ANESTHESIA:  General.  ANTIBIOTICS:  2 g IV Ancef.  BLOOD LOSS: Less than 100 mL.  COMPLICATIONS:  None.  INDICATIONS:  The patient is a 87 year old female with dementia who was admitted to the hospital yesterday after sustaining a mechanical fall and injuring her left hip.  She seems to walk around on the hip, but she failed with the hip.  X-rays and CT  scan confirmed a completely nondisplaced femoral neck fracture.  She actually had the same fracture on the right hip when she was 87 years old last year.  I placed cannulated screws in that fracture because she surprising had good bone quality.  She  ambulated on that right away and it did well and healed completely.  We felt that we should be as minimally invasive as possible with this surgery and I explained that to her healthcare power of attorney and they agreed as well and requested me for the  surgery and are fine with [place] trying cannulated screws.  Obviously with risk of nonunion.  There may be a risk of need to remove the screws and turn this into a partial hip replacement, but hopefully we can get this to heal.  We had a long and thorough  discussion of the risks and benefits of surgery and they did wish for [place] to proceed given her quality of life issues.  DESCRIPTION OF PROCEDURE:  After informed consent was obtained, appropriate left hip was marked.  She was brought to  the operating room.  General anesthesia was obtained while she was on a stretcher.  She was then placed supine on the fracture table.   The perineal post in place and her left leg in line skeletal traction, but no traction applied.  Her right hip was prepped and lower extremity was placed in a well leg holder with appropriate padding.  We then assessed the fracture under fluoroscopic  guidance.  She remained completely nondisplaced.  She does have significant valgus angles of both her femoral necks.  We then prepped her left hip with DuraPrep and sterile drapes.  A timeout was called and she was identified correct patient, correct  left hip.  We then made a small incision over the lateral aspect of the hip.  We placed three temporary guide pins in an inverted triangle format at the lateral cortex of the femur traversing the femoral neck and into the femoral head.  I then made  measurements off of this and placed three cannulated screws.  The inferior screw at the calcar, we placed a fully threaded 6.5 mm titanium screw and then the upper screws.  We placed an anterior and posterior ones that were partially threaded.  We then  put the hip through internal and external rotation with all pins removed and the screws in place.  It moved as a unit.  We assessed this under fluoroscopic guidance as well.  We were pleased with the placement of the screws.  We then irrigated the  small  wound with normal saline solution and closed deep tissue with 0 Vicryl followed by 2-0 Vicryl to close the subcutaneous tissue, interrupted staples to close the skin.  An Aquacel dressing was applied.  She was taken off the fracture table, awakened,  extubated, and taken to recovery room in stable condition with all final counts being correct.  No complications noted.   MUK D: 01/04/2021 5:42:08 pm T: 01/05/2021 12:05:00 am  JOB: [id number]/ [id number]

## 2021-01-05 NOTE — Evaluation (Signed)
Physical Therapy Evaluation Patient Details Name: [name] MRN: [id number] DOB: 1922/03/28 Today's Date: 01/05/2021  History of Present Illness  87 y.o. female admitted on 01/03/21 for fall with L hip fx. Pt s/p L hip cannulated screw fixation on 7/8 and is NWB L leg postop.  Pt with significant PMH ofAlzheimer disease, dementia, essential HTN.  Clinical Impression  Pt was able to sit EOB with me for ~15 mins.  Mod to max physical assist to progress to EOB.  I did not attempt OOB mobility due to NWB status of Left leg.  It will require a second person to assist as pt's dementia prevents her from fully comprehending her NWB status.  PT will continue to follow acutely for safe mobility progression.  I have procured an OT order as chart review indicates she was performing some of her ADLs PTA.   PT to follow acutely for deficits listed below.        Recommendations for follow up therapy are one component of a multi-disciplinary discharge planning process, led by the attending physician.  Recommendations may be updated based on patient status, additional functional criteria and insurance authorization.  Follow Up Recommendations Skilled nursing-short term rehab (<3 hours/day)    Assistance Recommended at Discharge Frequent or constant Supervision/Assistance  Functional Status Assessment Patient has had a recent decline in their functional status and demonstrates the ability to make significant improvements in function in a reasonable and predictable amount of time.  Equipment Recommendations  Wheelchair (measurements PT);Wheelchair cushion (measurements PT);Hospital bed;Other (comment) (hoyer lift)    Recommendations for Other Services OT consult     Precautions / Restrictions Precautions Precautions: Fall Restrictions Weight Bearing Restrictions: Yes LLE Weight Bearing: Non weight bearing      Mobility  Bed Mobility Overal bed mobility: Needs Assistance Bed Mobility: Supine to  Sit;Sit to Supine     Supine to sit: Mod assist;HOB elevated Sit to supine: HOB elevated;Max assist   General bed mobility comments: Mod assist to separately assist legs and then trunk from maximally elevated HOB.  Pt did not resist transitions, only grimaced and groaned due to pain.  More assist at trunk and legs to return to supine.    Transfers                   General transfer comment: Did not attempt as I would need a second person to hold the leg in NWB.    Ambulation/Gait                  Stairs            Wheelchair Mobility    Modified Rankin (Stroke Patients Only)       Balance Overall balance assessment: Needs assistance Sitting-balance support: Feet supported;Bilateral upper extremity supported Sitting balance-Leahy Scale: Poor Sitting balance - Comments: reliant on bil UE support and slow progressive posterior LOB. Postural control: Posterior lean                                   Pertinent Vitals/Pain Pain Assessment: Faces Faces Pain Scale: Hurts even more Breathing: normal Negative Vocalization: none Facial Expression: smiling or inexpressive Body Language: relaxed Consolability: no need to console PAINAD Score: 0 Pain Location: left hip with ROM and mobility Pain Descriptors / Indicators: Grimacing;Guarding Pain Intervention(s): Limited activity within patient's tolerance;Monitored during session;Repositioned    Home Living Family/patient expects to be discharged  to:: Assisted living                   Additional Comments: [facility]    Prior Function Prior Level of Function : Needs assist       Physical Assist : Mobility (physical)     Mobility Comments: supervision for short distance gait ADLs Comments: Pt preformed some of her ADLs per chart review.     Hand Dominance   Dominant Hand: Right    Extremity/Trunk Assessment   Upper Extremity Assessment Upper Extremity  Assessment: Defer to OT evaluation    Lower Extremity Assessment Lower Extremity Assessment: LLE deficits/detail LLE Deficits / Details: left leg with normal post op pain and weakness.  Ankle 3/5, knee 3-/5, hip 2/5 per gross supine and seated assessment.    Cervical / Trunk Assessment Cervical / Trunk Assessment: Kyphotic  Communication   Communication: No difficulties  Cognition Arousal/Alertness: Awake/alert Behavior During Therapy: Restless Overall Cognitive Status: History of cognitive impairments - at baseline                                 General Comments: Unsure of basline, dementia patients tend to have increased confusion out of their normal environment.        General Comments General comments (skin integrity, edema, etc.): used music from the 1950s to try to engage pt, however, I believe she was too distracted by discomfort to engage with the music.    Exercises General Exercises - Upper Extremity Shoulder Flexion: AAROM;Both;10 reps Shoulder ABduction: AAROM;Both;10 reps Elbow Flexion: AAROM;Both;10 reps Elbow Extension: AAROM;Both;10 reps General Exercises - Lower Extremity Ankle Circles/Pumps: AAROM;Both;10 reps Long Arc Quad: AROM;Both;5 reps Heel Slides: AAROM;Both;10 reps Hip ABduction/ADduction: AAROM;Both;10 reps   Assessment/Plan    PT Assessment Patient needs continued PT services  PT Problem List Decreased strength;Decreased range of motion;Decreased activity tolerance;Decreased balance;Decreased mobility;Decreased knowledge of use of DME;Decreased cognition;Decreased knowledge of precautions;Pain       PT Treatment Interventions DME instruction;Functional mobility training;Therapeutic exercise;Therapeutic activities;Balance training;Cognitive remediation;Patient/family education;Wheelchair mobility training;Modalities;Manual techniques    PT Goals (Current goals can be found in the Care Plan section)  Acute Rehab PT Goals Patient  Stated Goal: unable to state PT Goal Formulation: Patient unable to participate in goal setting Time For Goal Achievement: 02/05/2021 Potential to Achieve Goals: Good    Frequency Min 3X/week   Barriers to discharge        Co-evaluation               AM-PAC PT "6 Clicks" Mobility  Outcome Measure Help needed turning from your back to your side while in a flat bed without using bedrails?: Total Help needed moving from lying on your back to sitting on the side of a flat bed without using bedrails?: A Lot Help needed moving to and from a bed to a chair (including a wheelchair)?: Total Help needed standing up from a chair using your arms (e.g., wheelchair or bedside chair)?: Total Help needed to walk in hospital room?: Total Help needed climbing 3-5 steps with a railing? : Total 6 Click Score: 7    End of Session   Activity Tolerance: Patient limited by pain Patient left: in bed;with call bell/phone within reach;with bed alarm set Nurse Communication: Mobility status PT Visit Diagnosis: Muscle weakness (generalized) (M62.81);Difficulty in walking, not elsewhere classified (R26.2);Pain Pain - Right/Left: Left Pain - part of body: Leg  Time: 1700-1726 PT Time Calculation (min) (ACUTE ONLY): 26 min   Charges:   PT Evaluation $PT Eval Moderate Complexity: 1 Mod PT Treatments $Therapeutic Activity: 8-22 mins       Corinna Capra, PT, DPT  Acute Rehabilitation Ortho Tech Supervisor [phone] pager [phone]) [phone] office

## 2021-01-06 ENCOUNTER — Encounter (HOSPITAL_COMMUNITY): Payer: Self-pay | Admitting: Orthopaedic Surgery

## 2021-01-06 DIAGNOSIS — R296 Repeated falls: Secondary | ICD-10-CM

## 2021-01-06 DIAGNOSIS — S06330A Contusion and laceration of cerebrum, unspecified, without loss of consciousness, initial encounter: Secondary | ICD-10-CM

## 2021-01-06 DIAGNOSIS — I10 Essential (primary) hypertension: Secondary | ICD-10-CM | POA: Diagnosis not present

## 2021-01-06 DIAGNOSIS — F0390 Unspecified dementia without behavioral disturbance: Secondary | ICD-10-CM | POA: Diagnosis not present

## 2021-01-06 DIAGNOSIS — E782 Mixed hyperlipidemia: Secondary | ICD-10-CM | POA: Diagnosis not present

## 2021-01-06 DIAGNOSIS — S72002D Fracture of unspecified part of neck of left femur, subsequent encounter for closed fracture with routine healing: Secondary | ICD-10-CM | POA: Diagnosis not present

## 2021-01-06 LAB — BASIC METABOLIC PANEL
Anion gap: 8 (ref 5–15)
BUN: 22 mg/dL (ref 8–23)
CO2: 26 mmol/L (ref 22–32)
Calcium: 8.2 mg/dL — ABNORMAL LOW (ref 8.9–10.3)
Chloride: 101 mmol/L (ref 98–111)
Creatinine, Ser: 0.74 mg/dL (ref 0.44–1.00)
GFR, Estimated: >60 mL/min (ref >60)
Glucose, Bld: 96 mg/dL (ref 70–99)
Potassium: 3.4 mmol/L — ABNORMAL LOW (ref 3.5–5.1)
Sodium: 135 mmol/L (ref 135–145)

## 2021-01-06 LAB — CBC
HCT: 34.4 % — ABNORMAL LOW (ref 36.0–46.0)
Hemoglobin: 11.2 g/dL — ABNORMAL LOW (ref 12.0–15.0)
MCH: 29.4 pg (ref 26.0–34.0)
MCHC: 32.6 g/dL (ref 30.0–36.0)
MCV: 90.3 fL (ref 80.0–100.0)
Platelets: 240 10*3/uL (ref 150–400)
RBC: 3.81 MIL/uL — ABNORMAL LOW (ref 3.87–5.11)
RDW: 12.6 % (ref 11.5–15.5)
WBC: 12.8 10*3/uL — ABNORMAL HIGH (ref 4.0–10.5)
nRBC: 0 % (ref 0.0–0.2)

## 2021-01-06 NOTE — Care Management Important Message (Signed)
Important Message  Patient Details  Name: [name] MRN: [id number] Date of Birth: [birth year]   Medicare Important Message Given:  Yes     [name] 01/06/2021, 3:02 PM

## 2021-01-06 NOTE — Progress Notes (Signed)
Subjective: 2 Days Post-Op Procedure(s) (LRB): CANNULATED HIP PINNING (Left) Slow progress with PT due to Alzheimer and dementia. Family bedside, questions about her swallowing medications.  Objective: Vital signs in last 24 hours: Temp:  [97.7 F (36.5 C)-99 F (37.2 C)] 97.8 F (36.6 C) (11/10 0744) Pulse Rate:  [68-76] 70 (11/10 0744) Resp:  [16-19] 16 (11/10 0744) BP: (104-123)/(50-94) 123/73 (11/10 0744) SpO2:  [94 %-97 %] 97 % (11/10 0744)  Intake/Output from previous day: No intake/output data recorded. Intake/Output this shift: No intake/output data recorded.  Recent Labs    01/03/21 1334 01/04/21 0505 01/05/21 0242 01/06/21 0309  HGB 13.9 12.4 11.6* 11.2*   Recent Labs    01/05/21 0242 01/06/21 0309  WBC 12.3* 12.8*  RBC 3.94 3.81*  HCT 36.0 34.4*  PLT 215 240   Recent Labs    01/05/21 0242 01/06/21 0309  NA 134* 135  K 3.9 3.4*  CL 100 101  CO2 23 26  BUN 16 22  CREATININE 0.80 0.74  GLUCOSE 125* 96  CALCIUM 8.2* 8.2*   No results for input(s): LABPT, INR in the last 72 hours.  Incision: dressing C/D/I Compartment soft   Assessment/Plan: 2 Days Post-Op Procedure(s) (LRB): CANNULATED HIP PINNING (Left) Patient may need palliative care consult. Needs to be periodically rolled to side to take pressure off back and avoid skin breakdown.        Kelcey Korus 01/06/2021, 9:44 AM

## 2021-01-06 NOTE — TOC Initial Note (Signed)
Transition of Care [facility]) - Initial/Assessment Note    Patient Details  Name: [name] MRN: [id number] Date of Birth: 09-22-1922  Transition of Care [facility]) CM/SW Contact:    Ralene Bathe, LCSWA Phone Number: 01/06/2021, 2:50 PM  Clinical Narrative:                 CSW received consult for possible SNF placement at time of discharge. CSW spoke with patient' niece due to the patient being disoriented.  The niece expressed understanding of PT recommendation and is agreeable to SNF placement at time of discharge. Patient reports preference for [facility]. CSW discussed insurance authorization process and will provide Medicare SNF ratings list. Patient has received at least 3 COVID vaccines. No further questions reported at this time.   Skilled Nursing Rehab Facilities-   [email or web address]   Ratings out of 5 possible   Name Address  Phone # Quality Care Staffing Health Inspection Overall  [facility] [address], [place] [phone] 5 5 2 4   [facility]  [address] [place], [place] [phone] 4 2 5 5   [facility] [address] [place], [address] [place] 4 1 1 1   [facility] [address] 2 2 4 4   [facility] [address] [phone] 2 1 1 1   [facility] [phone] [address] [phone] 3 1 4 3   [facility] [address] [place] 5 2 2 3   [facility] [address] [facility] [place] 4 1 2 1   [facility] [address] [facility] 5 1 2 2   [facility] [phone], [phone] 4 1 1 1   [facility] [address], [facility] [phone] 4 1 2 1   [facility]. [address] 4 1 1 1             Patient Goals and CMS Choice        Expected Discharge Plan and Services                                                 Prior Living Arrangements/Services                       Activities of Daily Living Home Assistive Devices/Equipment: Other (Comment) (pt unable to answer) ADL Screening (condition at time of admission) Patient's cognitive ability adequate to safely complete daily activities?: No Is the patient deaf or have difficulty hearing?: No (per previous hx) Does the patient have difficulty seeing, even when wearing glasses/contacts?: No (per previous hx) Does the patient have difficulty concentrating, remembering, or making decisions?: Yes Patient able to express need for assistance with ADLs?: Yes Does the patient have difficulty dressing or bathing?: Yes Independently performs ADLs?: No Communication: Independent Dressing (OT): Needs assistance Is this a change from baseline?: Pre-admission baseline Grooming: Independent Feeding: Independent Bathing: Needs assistance Is this a change from baseline?: Pre-admission baseline Toileting: Needs assistance Is this a change from baseline?: Pre-admission baseline In/Out Bed: Needs assistance Is this a change from baseline?: Pre-admission baseline Walks in Home: Needs assistance Is this a change from baseline?: Pre-admission baseline Does the patient have difficulty walking or climbing stairs?: Yes Weakness of Legs: Right (per previous hx) Weakness of Arms/Hands: Right (  per previous hx)  Permission Sought/Granted                  Emotional Assessment              Admission diagnosis:  Hip fracture (HCC) [S72.009A] Left displaced femoral neck fracture (HCC) [S72.002A] Fall, initial encounter [W19.XXXA] Left-sided intracranial contusion, without loss of consciousness, initial encounter [facility]) [S06.890A] Patient Active Problem List   Diagnosis Date Noted   Fracture of femoral neck, left, closed (HCC) 01/03/2021   Contusion with petechial hemorrhage 01/03/2021   Scalp hematoma, initial  encounter 01/03/2021   Leucocytosis 01/03/2021   Hypothyroidism 01/03/2021   Vomiting 01/03/2021   Unwitnessed fall 01/03/2021   Closed right hip fracture (HCC) 05/02/2019   Subdural hematoma 03/21/2019   Essential hypertension 03/21/2019   Dementia without behavioral disturbance (HCC) 03/21/2019   Hyperlipidemia 03/21/2019   Hypokalemia 03/21/2019   PCP:  [facility] Pharmacy:  No Pharmacies Listed    Social Determinants of Health (SDOH) Interventions    Readmission Risk Interventions No flowsheet data found.

## 2021-01-06 NOTE — Evaluation (Signed)
Occupational Therapy Evaluation Patient Details Name: [name] MRN: [id number] DOB: [birth year] Today's Date: 01/06/2021   History of Present Illness 87 y.o. female admitted on 01/03/21 for fall with L hip fx. Pt s/p L hip cannulated screw fixation on 7/8 and is NWB L leg postop.  Pt with significant PMH ofAlzheimer disease, dementia, essential HTN.   Clinical Impression   Pt presented in bed and orientation x 1 to self and inconsistently able to follow one step commands. Pt required max to tal assist with rolling to complete peri care. Pt required total care for all LE ADLS at bed level. Pt in session required increase in o2 from 2 to 3L with activity as would destat to 80%. Pt currently with functional limitations due to the deficits listed below (see OT Problem List).  Pt will benefit from skilled OT to increase their safety and independence with ADL and functional mobility for ADL to facilitate discharge to venue listed below.        Recommendations for follow up therapy are one component of a multi-disciplinary discharge planning process, led by the attending physician.  Recommendations may be updated based on patient status, additional functional criteria and insurance authorization.   Follow Up Recommendations  Skilled nursing-short term rehab (<3 hours/day)    Assistance Recommended at Discharge Frequent or constant Supervision/Assistance  Functional Status Assessment  Patient has had a recent decline in their functional status and demonstrates the ability to make significant improvements in function in a reasonable and predictable amount of time.  Equipment Recommendations       Recommendations for Other Services       Precautions / Restrictions Precautions Precautions: Fall;Other (comment) (cognition) Restrictions Weight Bearing Restrictions: Yes LLE Weight Bearing: Non weight bearing      Mobility Bed Mobility Overal bed mobility: Needs Assistance Bed Mobility:  Rolling Rolling: Max assist;Total assist (Max to R side and total to L side)              Transfers                   General transfer comment: Pt following one step commands inconsistently      Balance                                           ADL either performed or assessed with clinical judgement   ADL Overall ADL's : Needs assistance/impaired Eating/Feeding: Moderate assistance;Cueing for safety;Cueing for sequencing;Sitting   Grooming: Maximal assistance;Cueing for safety;Cueing for sequencing;Bed level   Upper Body Bathing: Maximal assistance;Cueing for safety;Cueing for sequencing;Bed level   Lower Body Bathing: Total assistance;Cueing for safety;Cueing for sequencing;Bed level   Upper Body Dressing : Maximal assistance;Cueing for safety;Cueing for sequencing;Bed level   Lower Body Dressing: Total assistance;Cueing for safety;Cueing for sequencing;Bed level                 General ADL Comments: Pt limited with ADL particiaption due to pain and guard of LLE and decrease ability to follow one step commands     Vision         Perception     Praxis      Pertinent Vitals/Pain Pain Assessment: Faces Faces Pain Scale: Hurts even more Breathing: normal Negative Vocalization: none Facial Expression: smiling or inexpressive Body Language: tense, distressed pacing, fidgeting Consolability: no need to console PAINAD Score: 1  Pain Location: L hip with bed mobility for peri care Pain Descriptors / Indicators: Discomfort;Guarding Pain Intervention(s): Limited activity within patient's tolerance;Monitored during session;Repositioned     Hand Dominance Right   Extremity/Trunk Assessment Upper Extremity Assessment Upper Extremity Assessment: RUE deficits/detail;LUE deficits/detail RUE Deficits / Details: Pt limited ROM but pt noted they would flinch and guard with any attempt to assist RUE: Unable to fully assess due to pain LUE  Deficits / Details: Pt limited ROM but pt noted they would flinch and guard with any attempt to assist LUE: Unable to fully assess due to pain   Lower Extremity Assessment Lower Extremity Assessment: LLE deficits/detail LLE Deficits / Details: post op pain   Cervical / Trunk Assessment Cervical / Trunk Assessment: Kyphotic   Communication Communication Communication: No difficulties   Cognition Arousal/Alertness: Awake/alert Behavior During Therapy: Restless Overall Cognitive Status: History of cognitive impairments - at baseline                                 General Comments: Unsure of basline, dementia patients tend to have increased confusion out of their normal environment.     General Comments       Exercises Exercises: General Upper Extremity General Exercises - Upper Extremity Shoulder Flexion: AAROM;5 reps;Both Elbow Extension: AAROM;Both;5 reps   Shoulder Instructions      Home Living Family/patient expects to be discharged to:: Assisted living                                 Additional Comments: [facility]      Prior Functioning/Environment Prior Level of Function : Needs assist  Cognitive Assist : ADLs (cognitive);Mobility (cognitive) Mobility (Cognitive):  (lived in memory care setting but unsure of about of cues)   Physical Assist : Mobility (physical)     Mobility Comments: supervision for short distance gait          OT Problem List: Decreased strength;Decreased range of motion;Decreased safety awareness;Decreased knowledge of use of DME or AE;Pain      OT Treatment/Interventions: Self-care/ADL training;Therapeutic exercise;Therapeutic activities;Patient/family education;Balance training    OT Goals(Current goals can be found in the care plan section) Acute Rehab OT Goals Patient Stated Goal: none reported OT Goal Formulation: With patient Time For Goal Achievement: 01/22/21 Potential to Achieve  Goals: Good ADL Goals Pt Will Perform Eating: with set-up;sitting Pt Will Perform Grooming: with set-up;sitting Pt Will Perform Upper Body Bathing: with min assist;sitting Pt Will Perform Lower Body Bathing: with mod assist;sit to/from stand  OT Frequency: Min 2X/week   Barriers to D/C:            Co-evaluation              AM-PAC OT "6 Clicks" Daily Activity     Outcome Measure Help from another person eating meals?: A Lot Help from another person taking care of personal grooming?: A Lot Help from another person toileting, which includes using toliet, bedpan, or urinal?: Total Help from another person bathing (including washing, rinsing, drying)?: A Lot Help from another person to put on and taking off regular upper body clothing?: A Lot Help from another person to put on and taking off regular lower body clothing?: Total 6 Click Score: 10   End of Session Equipment Utilized During Treatment: Oxygen (Pt needed increase to 3 L with movement as mouth  breathing) Nurse Communication: Mobility status;Other (comment) (o2 readings)  Activity Tolerance: Patient tolerated treatment well Patient left: in bed;with call bell/phone within reach;with bed alarm set  OT Visit Diagnosis: Unsteadiness on feet (R26.81);Other abnormalities of gait and mobility (R26.89);Repeated falls (R29.6);Muscle weakness (generalized) (M62.81);History of falling (Z91.81);Pain Pain - Right/Left: Left Pain - part of body: Leg                Time: 0822-0850 OT Time Calculation (min): 28 min Charges:  OT General Charges $OT Visit: 1 Visit OT Evaluation $OT Eval Low Complexity: 1 Low OT Treatments $Self Care/Home Management : 8-22 mins  Alphia Moh OTR/L  Acute Rehab Services  [phone] office number [phone] pager number   Alphia Moh 01/06/2021, 9:05 AM

## 2021-01-06 NOTE — Progress Notes (Signed)
PROGRESS NOTE  [name] RJJ:[id number] DOB: 01-17-1923 DOA: 01/03/2021 PCP: Almetta Lovely, [facility]  Brief History   The patient is a 87 yr old woman who presented after a witnessed fall. She is from [employer]. The fall was preceded by an episode of vomiting and incontinence of bladder.  There was no seiure like events. There is no recent change in medication. No previous nausea or vomiting. No frequent falls. There is no reported chest pain, fever, diarrhea, or strokelike symptoms prior to fall.  She carries a medical history if dementia, HTN, HLD, Hypothyroidism.   In the [facility] [place] ED the patient was found to have suffered a fracture of the left femoral neck, intracranial contusion with petechial hemorrhage.  The patient has been transferred to [facility] for further evaluation and treatment.  Neurosurgery has evaluated the patient and recommends no surgical intervention. The patient has also been evaluated by orthopedic surgery. The plan is for intramedullary nail placement.  The patient underwent cannulated screw fixation of left hip femoral neck fracture on the morning of 01/05/2021. She has tolerated the procedure well.  Consultants  Orthopedic surgery Neurosurgery  Procedures  None  Antibiotics   Anti-infectives (From admission, onward)    Start     Dose/Rate Route Frequency Ordered Stop   01/04/21 2300  ceFAZolin (ANCEF) IVPB 2g/100 mL premix        2 g 200 mL/hr over 30 Minutes Intravenous Every 6 hours 01/04/21 1859 01/05/21 0435      Subjective  The patient is resting comfortably. No new complaints.   Objective   Vitals:  Vitals:   01/06/21 0508 01/06/21 0744  BP: (!) 112/94 123/73  Pulse: 72 70  Resp: 18 16  Temp: 97.9 F (36.6 C) 97.8 F (36.6 C)  SpO2: 96% 97%    Exam:  Constitutional:  The patient is awake and alert. She is verbal, but confused. No acute distress. Respiratory:  CTA bilaterally, no w/r/r.  Respiratory effort normal. No retractions  or accessory muscle use Cardiovascular:  RRR, no m/r/g No LE extremity edema   Normal pedal pulses Abdomen:  Abdomen appears normal; no tenderness or masses No hernias No HSM Musculoskeletal:  Digits/nails BUE: no clubbing, cyanosis, petechiae, infection Skin:  No rashes, lesions, ulcers palpation of skin: no induration or nodules Neurologic:  CN 2-12 intact Sensation all 4 extremities intact Psychiatric:  Patient is pleasantly confused. I have personally reviewed the following:   Today's Data  Vitals  Lab Data  CBC, BMP  Imaging  DG pelvis, abdomen CT hip CT head  Scheduled Meds:  (feeding supplement) PROSource Plus  30 mL Oral BID BM   aspirin  325 mg Oral Q breakfast   docusate sodium  100 mg Oral BID   feeding supplement  237 mL Oral BID BM   levothyroxine  12.5 mcg Oral QODAY   multivitamin with minerals  1 tablet Oral Daily   pantoprazole  40 mg Oral Daily   polyethylene glycol  17 g Oral Daily   pravastatin  10 mg Oral QPM   traZODone  50 mg Oral QHS   Continuous Infusions:  lactated ringers 50 mL/hr at 01/05/21 1109    Principal Problem:   Fracture of femoral neck, left, closed (HCC) Active Problems:   Essential hypertension   Dementia without behavioral disturbance (HCC)   Hyperlipidemia   Contusion with petechial hemorrhage   Scalp hematoma, initial encounter   Leucocytosis   Hypothyroidism   Vomiting   Unwitnessed fall  LOS: 3 days   A & P  Fracture of femoral neck, left, closed (HCC) Presents with unwitnessed fall.  Does not have any significant pain.  CT scan confirms presence of femoral neck fracture on the left. Orthopedic consulted.  Currently recommending intramedullary nail placement surgery. Patient will be transferred to [facility] to facilitate this. Discussed with daughter she currently consenting and would like to proceed with the surgery. Recommended the patient will remain at high risk for encephalopathy postoperatively  and most likely with her dementia may not be able to work with physical therapy which is a key part of recovery.  She understands it.  Patient actually had a surgery 2021 March and so far has done well per daughter.  S/P cannulated screw fixation of left hip femoral neck fracture on the morning of 01/05/2021. The patient has tolerated the procedure well.  Plan is for discharge to SNF. She is making slow progress with PT/OT.    Unwitnessed fall Etiology of the fall is not clear.  For now we will monitor clinically.   Scalp hematoma, initial encounter Unwitnessed fall.  No bleeding so far.  Monitor.   Contusion with petechial hemorrhage Presents with an unwitnessed fall. CT scan shows evidence of contusion as well as petechial hemorrhage on the left. Neurosurgery was consulted based on the evaluation does not recommend any procedure or intervention. No further imaging recommendation as well. No focal deficit other than chronic dementia at the time of my evaluation.   Vomiting Per staff at [facility], patient had episode of vomiting as well as urination.  At the time of the fall although denies any seizure-like episode. For now we will monitor.  No focal deficit.   Leucocytosis Secondary to fall and fracture.  Monitor.  Currently no evidence of acute infection.   Essential hypertension Blood pressure currently normal.  We will hold antihypertensive medication and just provide IV fluids   Dementia without behavioral disturbance (HCC) Placing the patient at significant risk for poor outcomes postoperatively given her dementia. After her recent surgery per daughter patient actually has progressive decline in her ability to ambulate although at her baseline she is able to feed herself, dress herself and ambulate with assistance but mostly spends the day in bed. Patient may not be able to follow PT recommendation and nutrition guidance. Progress with PT/OT is slow. Daughter is aware.    Hyperlipidemia Continue statin   Hypothyroidism Continue Synthroid   Preoperative medical evaluation No Coronary revascularization/CVA within 5 years. No Recent stress test Does not Climb flight of stair, participates in recreational activity,does household chores. No Prior adverse event with anesthesia. No Alcohol use, drug use.   A) Cardiac risk: Based on RCRI  With this the patient is a moderate risk for adverse Cardiac outcome from surgery.   B) Pulmonary risk: No history of COPD or sleep apnea.   C) Bleeding risk Currently on aspirin and SCD.  I have seen and examined this patient myself. I have spent 34 minutes in her evaluation and care.     Code Status: DNR   DVT Prophylaxis:   SCDs Start: 01/03/21 1535 Place TED hose Start: 01/03/21 1535   Family Communication: None available Admission status: Inpatient Med-Surg   Sherol Sabas, DO [facility] Direct contact: see [email or web address]  7PM-7AM contact night coverage as above 01/06/2021, 5:23 PM  LOS: 1 day

## 2021-01-07 DIAGNOSIS — I10 Essential (primary) hypertension: Secondary | ICD-10-CM | POA: Diagnosis not present

## 2021-01-07 DIAGNOSIS — F0390 Unspecified dementia without behavioral disturbance: Secondary | ICD-10-CM | POA: Diagnosis not present

## 2021-01-07 DIAGNOSIS — S72002D Fracture of unspecified part of neck of left femur, subsequent encounter for closed fracture with routine healing: Secondary | ICD-10-CM | POA: Diagnosis not present

## 2021-01-07 DIAGNOSIS — E782 Mixed hyperlipidemia: Secondary | ICD-10-CM | POA: Diagnosis not present

## 2021-01-07 LAB — CBC
HCT: 36.4 % (ref 36.0–46.0)
Hemoglobin: 11.8 g/dL — ABNORMAL LOW (ref 12.0–15.0)
MCH: 29.2 pg (ref 26.0–34.0)
MCHC: 32.4 g/dL (ref 30.0–36.0)
MCV: 90.1 fL (ref 80.0–100.0)
Platelets: 257 10*3/uL (ref 150–400)
RBC: 4.04 MIL/uL (ref 3.87–5.11)
RDW: 12.4 % (ref 11.5–15.5)
WBC: 10.4 10*3/uL (ref 4.0–10.5)
nRBC: 0 % (ref 0.0–0.2)

## 2021-01-07 LAB — BASIC METABOLIC PANEL
Anion gap: 9 (ref 5–15)
BUN: 20 mg/dL (ref 8–23)
CO2: 27 mmol/L (ref 22–32)
Calcium: 8.5 mg/dL — ABNORMAL LOW (ref 8.9–10.3)
Chloride: 100 mmol/L (ref 98–111)
Creatinine, Ser: 0.76 mg/dL (ref 0.44–1.00)
GFR, Estimated: >60 mL/min (ref >60)
Glucose, Bld: 94 mg/dL (ref 70–99)
Potassium: 3.4 mmol/L — ABNORMAL LOW (ref 3.5–5.1)
Sodium: 136 mmol/L (ref 135–145)

## 2021-01-07 MED ORDER — POTASSIUM CHLORIDE 10 MEQ/100ML IV SOLN
10.0000 meq | INTRAVENOUS | Status: AC
  Administered 2021-01-07 (×3): 10 meq via INTRAVENOUS
  Filled 2021-01-07 (×3): qty 100

## 2021-01-07 NOTE — Progress Notes (Addendum)
Pt has been refusing oral intakes despite encouragement. Pt won't swallow even when orally stimulated. Mouth care provided but pt is resistant. Pt would occasionally moan and picked at surgical dressing when turning. IV pain med was given instead for pain due to pt refusing oral intake. Will continue to monitor.

## 2021-01-07 NOTE — Progress Notes (Signed)
PROGRESS NOTE  [name] YHC:[id number] DOB: May 11, 1922 DOA: 01/03/2021 PCP: Almetta Lovely, [facility]  Brief History   The patient is a 87 yr old woman who presented after a witnessed fall. She is from [employer]. The fall was preceded by an episode of vomiting and incontinence of bladder.  There was no seiure like events. There is no recent change in medication. No previous nausea or vomiting. No frequent falls. There is no reported chest pain, fever, diarrhea, or strokelike symptoms prior to fall.  She carries a medical history if dementia, HTN, HLD, Hypothyroidism.   In the [facility] ED the patient was found to have suffered a fracture of the left femoral neck, intracranial contusion with petechial hemorrhage.  The patient has been transferred to [facility] for further evaluation and treatment.  Neurosurgery has evaluated the patient and recommends no surgical intervention. The patient has also been evaluated by orthopedic surgery. The plan is for intramedullary nail placement.  The patient underwent cannulated screw fixation of left hip femoral neck fracture on the morning of 01/05/2021. She has tolerated the procedure well. However, per nursing and report of the patient's daughter the patient has been refusing oral intake and medications. She also has demonstrated poor performance in working with PT/OT.   Palliative care has been consulted.  Consultants  Orthopedic surgery Neurosurgery Palliative care  Procedures  Cannulated screw fixation of left hip femoral neck fracture  Antibiotics   Anti-infectives (From admission, onward)    Start     Dose/Rate Route Frequency Ordered Stop   01/04/21 2300  ceFAZolin (ANCEF) IVPB 2g/100 mL premix        2 g 200 mL/hr over 30 Minutes Intravenous Every 6 hours 01/04/21 1859 01/05/21 0435      Subjective  The patient is resting comfortably. No new complaints.   Objective   Vitals:  Vitals:   01/07/21 0415 01/07/21 1149  BP: (!) 150/64  (!) 144/62  Pulse: 69 74  Resp: 20 16  Temp:  97.8 F (36.6 C)  SpO2: 99% 95%    Exam:  Constitutional:  The patient is awake and alert. She is verbal, but confused. No acute distress. Respiratory:  CTA bilaterally, no w/r/r.  Respiratory effort normal. No retractions or accessory muscle use Cardiovascular:  RRR, no m/r/g No LE extremity edema   Normal pedal pulses Abdomen:  Abdomen appears normal; no tenderness or masses No hernias No HSM Musculoskeletal:  Digits/nails BUE: no clubbing, cyanosis, petechiae, infection Skin:  No rashes, lesions, ulcers palpation of skin: no induration or nodules Neurologic:  CN 2-12 intact Sensation all 4 extremities intact Psychiatric:  Patient is pleasantly confused. I have personally reviewed the following:   Today's Data  Vitals  Lab Data  CBC, BMP  Imaging  DG pelvis, abdomen CT hip CT head  Scheduled Meds:  (feeding supplement) PROSource Plus  30 mL Oral BID BM   aspirin  325 mg Oral Q breakfast   docusate sodium  100 mg Oral BID   feeding supplement  237 mL Oral BID BM   levothyroxine  12.5 mcg Oral QODAY   multivitamin with minerals  1 tablet Oral Daily   pantoprazole  40 mg Oral Daily   polyethylene glycol  17 g Oral Daily   pravastatin  10 mg Oral QPM   traZODone  50 mg Oral QHS   Continuous Infusions:  lactated ringers 50 mL/hr at 01/06/21 2118    Principal Problem:   Fracture of femoral neck, left, closed (  HCC) Active Problems:   Essential hypertension   Dementia without behavioral disturbance (HCC)   Hyperlipidemia   Contusion with petechial hemorrhage   Scalp hematoma, initial encounter   Leucocytosis   Hypothyroidism   Vomiting   Unwitnessed fall   LOS: 4 days   A & P  Fracture of femoral neck, left, closed (HCC) Presents with unwitnessed fall.  Does not have any significant pain.  CT scan confirms presence of femoral neck fracture on the left. Orthopedic consulted.  Currently recommending  intramedullary nail placement surgery. Patient will be transferred to [facility] to facilitate this. Discussed with daughter she currently consenting and would like to proceed with the surgery. Recommended the patient will remain at high risk for encephalopathy postoperatively and most likely with her dementia may not be able to work with physical therapy which is a key part of recovery.  She understands it.  Patient actually had a surgery 2021 March and so far has done well per daughter.  S/P cannulated screw fixation of left hip femoral neck fracture on the morning of 01/05/2021. The patient has tolerated the procedure well.  Plan currently is for discharge to SNF, although the family will discuss options for hospice for patient.   Unwitnessed fall Etiology of the fall is not clear.  For now we will monitor clinically.   Scalp hematoma, initial encounter Unwitnessed fall.  No bleeding so far.  Monitor.   Contusion with petechial hemorrhage Presents with an unwitnessed fall. CT scan shows evidence of contusion as well as petechial hemorrhage on the left. Neurosurgery was consulted based on the evaluation does not recommend any procedure or intervention. No further imaging recommendation as well. No focal deficit other than chronic dementia at the time of my evaluation.   Vomiting Per staff at [facility], patient had episode of vomiting as well as urination.  At the time of the fall although denies any seizure-like episode. For now we will monitor.  No focal deficit.   Leucocytosis Secondary to fall and fracture.  Monitor.  Currently no evidence of acute infection.   Essential hypertension Blood pressure currently normal.  We will hold antihypertensive medication and just provide IV fluids   Dementia without behavioral disturbance (HCC) Placing the patient at significant risk for poor outcomes postoperatively given her dementia. After her recent surgery per daughter patient  actually has progressive decline in her ability to ambulate although at her baseline she is able to feed herself, dress herself and ambulate with assistance but mostly spends the day in bed. Patient may not be able to follow PT recommendation and nutrition guidance. Progress with PT/OT is slow. Daughter is aware.  Patient with poor PO intake, refusing meds and difficulty in participating with PT. Family will discuss with palliative care.   Hyperlipidemia Continue statin   Hypothyroidism Continue Synthroid   Preoperative medical evaluation No Coronary revascularization/CVA within 5 years. No Recent stress test Does not Climb flight of stair, participates in recreational activity,does household chores. No Prior adverse event with anesthesia. No Alcohol use, drug use.   A) Cardiac risk: Based on RCRI  With this the patient is a moderate risk for adverse Cardiac outcome from surgery.   B) Pulmonary risk: No history of COPD or sleep apnea.   C) Bleeding risk Currently on aspirin and SCD.  I have seen and examined this patient myself. I have spent 34 minutes in her evaluation and care.     Code Status: DNR   DVT Prophylaxis:  SCDs Start: 01/03/21 1535 Place TED hose Start: 01/03/21 1535   Family Communication: None available Admission status: Inpatient Med-Surg   Claire Dolores, DO [facility] Direct contact: see [email or web address]  7PM-7AM contact night coverage as above 01/07/2021, 4:38 PM  LOS: 1 day

## 2021-01-07 NOTE — Progress Notes (Signed)
Palliative:  Chart reviewed. Visited patient - incredibly confused. No family at bedside. Received update from RN - patient refusing food, liquid, meds, and therapy. Called niece/HCPOA, [name] - no answer, voice mail left with call back number and instructed niece to notify us of good time to meet tomorrow. Will anticipate family meeting 11/12.  Gerlean Ren, DNP, AGNP-C Palliative Medicine Team Team Phone # [phone]  Pager # [phone]  NO CHARGE

## 2021-01-07 NOTE — Progress Notes (Signed)
PT Cancellation Note  Patient Details Name: [name] MRN: [id number] DOB: 07/02/1922   Cancelled Treatment:    Reason Eval/Treat Not Completed: (P) Other (comment) (MD requested to hold PT today until pallative consult.)   Milton Streicher Artis Delay 01/07/2021, 11:59 AM  Bonney Leitz , PTA Acute Rehabilitation Services Pager [phone] Office [phone]

## 2021-01-07 NOTE — NC FL2 (Signed)
[place] MEDICAID FL2 LEVEL OF CARE SCREENING TOOL     IDENTIFICATION  Patient Name: [name] Birthdate: [birth year] Sex: female Admission Date (Current Location): 01/03/2021  [facility] and [place] Number:  Producer, television/film/video and Address:  [facility], [address], [place], [place] [address]      Provider Number: [id number]  Attending Physician Name and Address:  Fran Lowes, DO  Relative Name and Phone Number:  Levan Hurst [phone]    Current Level of Care: Hospital Recommended Level of Care: Skilled Nursing Facility Prior Approval Number:    Date Approved/Denied:   PASRR Number: [id number]  Discharge Plan: SNF    Current Diagnoses: Patient Active Problem List   Diagnosis Date Noted   Fracture of femoral neck, left, closed (HCC) 01/03/2021   Contusion with petechial hemorrhage 01/03/2021   Scalp hematoma, initial encounter 01/03/2021   Leucocytosis 01/03/2021   Hypothyroidism 01/03/2021   Vomiting 01/03/2021   Unwitnessed fall 01/03/2021   Closed right hip fracture (HCC) 05/02/2019   Subdural hematoma 03/21/2019   Essential hypertension 03/21/2019   Dementia without behavioral disturbance (HCC) 03/21/2019   Hyperlipidemia 03/21/2019   Hypokalemia 03/21/2019    Orientation RESPIRATION BLADDER Height & Weight     Self  O2 ([place] 2) Incontinent Weight: 120 lb (54.4 kg) Height:  5\' 2"  (157.5 cm)  BEHAVIORAL SYMPTOMS/MOOD NEUROLOGICAL BOWEL NUTRITION STATUS      Incontinent Diet (See DC summary)  AMBULATORY STATUS COMMUNICATION OF NEEDS Skin   Extensive Assist Verbally Surgical wounds (Left hip and L upper head incisions)                       Personal Care Assistance Level of Assistance  Bathing, Feeding, Dressing Bathing Assistance: Maximum assistance Feeding assistance: Limited assistance Dressing Assistance: Maximum assistance     Functional Limitations Info  Sight, Hearing, Speech Sight Info: Impaired Hearing  Info: Adequate Speech Info: Adequate    SPECIAL CARE FACTORS FREQUENCY  PT (By licensed PT), OT (By licensed OT)     PT Frequency: 5x week OT Frequency: 5x week            Contractures Contractures Info: Not present    Additional Factors Info  Code Status, Allergies Code Status Info: DNR Allergies Info: Penicillins           Current Medications (01/07/2021):  This is the current hospital active medication list Current Facility-Administered Medications  Medication Dose Route Frequency Provider Last Rate Last Admin   (feeding supplement) PROSource Plus liquid 30 mL  30 mL Oral BID BM 13/12/2020, MD   30 mL at 01/07/21 0945   acetaminophen (TYLENOL) tablet 325-650 mg  325-650 mg Oral Q6H PRN 13/11/22, MD       aspirin EC tablet 325 mg  325 mg Oral Q breakfast Kathryne Hitch, MD   325 mg at 01/07/21 0947   docusate sodium (COLACE) capsule 100 mg  100 mg Oral BID PRN 13/11/22, MD       docusate sodium (COLACE) capsule 100 mg  100 mg Oral BID Kathryne Hitch, MD   100 mg at 01/07/21 0948   feeding supplement (ENSURE ENLIVE / ENSURE PLUS) liquid 237 mL  237 mL Oral BID BM 13/11/22, MD   237 mL at 01/05/21 1502   HYDROcodone-acetaminophen (NORCO) 7.5-325 MG per tablet 1-2 tablet  1-2 tablet Oral Q4H PRN 06-27-1976,  MD   1 tablet at 01/06/21 1709   HYDROcodone-acetaminophen (NORCO/VICODIN) 5-325 MG per tablet 1-2 tablet  1-2 tablet Oral Q4H PRN Kathryne Hitch, MD       lactated ringers infusion   Intravenous Continuous Kathryne Hitch, MD 50 mL/hr at 01/06/21 2118 New Bag at 01/06/21 2118   levothyroxine (SYNTHROID) tablet 12.5 mcg  12.5 mcg Oral Moreen Fowler, MD   12.5 mcg at 01/05/21 5830   melatonin tablet 5 mg  5 mg Oral QHS PRN Kathryne Hitch, MD       menthol-cetylpyridinium (CEPACOL) lozenge 3 mg  1 lozenge Oral PRN Kathryne Hitch, MD        Or   phenol (CHLORASEPTIC) mouth spray 1 spray  1 spray Mouth/Throat PRN Kathryne Hitch, MD       metoCLOPramide (REGLAN) tablet 5-10 mg  5-10 mg Oral Q8H PRN Kathryne Hitch, MD       Or   metoCLOPramide (REGLAN) injection 5-10 mg  5-10 mg Intravenous Q8H PRN Kathryne Hitch, MD       morphine 2 MG/ML injection 0.5-1 mg  0.5-1 mg Intravenous Q2H PRN Kathryne Hitch, MD   0.5 mg at 01/07/21 0417   multivitamin with minerals tablet 1 tablet  1 tablet Oral Daily Kathryne Hitch, MD   1 tablet at 01/07/21 0945   ondansetron (ZOFRAN) tablet 4 mg  4 mg Oral Q6H PRN Kathryne Hitch, MD       Or   ondansetron [facility]) injection 4 mg  4 mg Intravenous Q6H PRN Kathryne Hitch, MD       pantoprazole (PROTONIX) EC tablet 40 mg  40 mg Oral Daily Kathryne Hitch, MD   40 mg at 01/07/21 0945   polyethylene glycol (MIRALAX / GLYCOLAX) packet 17 g  17 g Oral Daily Kathryne Hitch, MD   17 g at 01/07/21 0945   pravastatin (PRAVACHOL) tablet 10 mg  10 mg Oral QPM Kathryne Hitch, MD   10 mg at 01/06/21 1708   traZODone (DESYREL) tablet 50 mg  50 mg Oral QHS Kathryne Hitch, MD   50 mg at 01/04/21 2136   zolpidem (AMBIEN) tablet 5 mg  5 mg Oral QHS PRN Kathryne Hitch, MD   5 mg at 01/04/21 2257     Discharge Medications: Please see discharge summary for a list of discharge medications.  Relevant Imaging Results:  Relevant Lab Results:   Additional Information ss#[id number] [place]

## 2021-01-07 NOTE — Discharge Instructions (Signed)
Try to keep weight off of left hip, but ok if she does given her dementia. Can keep dry dressing on left hip incision.

## 2021-01-08 DIAGNOSIS — E782 Mixed hyperlipidemia: Secondary | ICD-10-CM | POA: Diagnosis not present

## 2021-01-08 DIAGNOSIS — S72002D Fracture of unspecified part of neck of left femur, subsequent encounter for closed fracture with routine healing: Secondary | ICD-10-CM | POA: Diagnosis not present

## 2021-01-08 DIAGNOSIS — F0390 Unspecified dementia without behavioral disturbance: Secondary | ICD-10-CM | POA: Diagnosis not present

## 2021-01-08 DIAGNOSIS — Z515 Encounter for palliative care: Secondary | ICD-10-CM

## 2021-01-08 DIAGNOSIS — Z7189 Other specified counseling: Secondary | ICD-10-CM | POA: Diagnosis not present

## 2021-01-08 DIAGNOSIS — Z66 Do not resuscitate: Secondary | ICD-10-CM

## 2021-01-08 DIAGNOSIS — R627 Adult failure to thrive: Secondary | ICD-10-CM

## 2021-01-08 DIAGNOSIS — I10 Essential (primary) hypertension: Secondary | ICD-10-CM | POA: Diagnosis not present

## 2021-01-08 LAB — CBC
HCT: 38.3 % (ref 36.0–46.0)
Hemoglobin: 12.8 g/dL (ref 12.0–15.0)
MCH: 29.6 pg (ref 26.0–34.0)
MCHC: 33.4 g/dL (ref 30.0–36.0)
MCV: 88.7 fL (ref 80.0–100.0)
Platelets: 282 10*3/uL (ref 150–400)
RBC: 4.32 MIL/uL (ref 3.87–5.11)
RDW: 12.1 % (ref 11.5–15.5)
WBC: 11 10*3/uL — ABNORMAL HIGH (ref 4.0–10.5)
nRBC: 0 % (ref 0.0–0.2)

## 2021-01-08 MED ORDER — HALOPERIDOL LACTATE 2 MG/ML PO CONC
0.5000 mg | ORAL | Status: DC | PRN
  Filled 2021-01-08: qty 0.3

## 2021-01-08 MED ORDER — GLYCOPYRROLATE 0.2 MG/ML IJ SOLN
0.2000 mg | INTRAMUSCULAR | Status: DC | PRN
  Filled 2021-01-08: qty 1

## 2021-01-08 MED ORDER — POLYVINYL ALCOHOL 1.4 % OP SOLN
1.0000 [drp] | Freq: Four times a day (QID) | OPHTHALMIC | Status: DC | PRN
  Filled 2021-01-08: qty 15

## 2021-01-08 MED ORDER — HALOPERIDOL 0.5 MG PO TABS
0.5000 mg | ORAL_TABLET | ORAL | Status: DC | PRN
  Filled 2021-01-08: qty 1

## 2021-01-08 MED ORDER — BIOTENE DRY MOUTH MT LIQD: 15.0000 mL | OROMUCOSAL | Status: DC | PRN

## 2021-01-08 MED ORDER — HALOPERIDOL LACTATE 5 MG/ML IJ SOLN: 0.5000 mg | INTRAMUSCULAR | Status: DC | PRN

## 2021-01-08 MED ORDER — GLYCOPYRROLATE 1 MG PO TABS
1.0000 mg | ORAL_TABLET | ORAL | Status: DC | PRN
  Filled 2021-01-08: qty 1

## 2021-01-08 MED ORDER — MORPHINE SULFATE (PF) 2 MG/ML IV SOLN
1.0000 mg | INTRAVENOUS | Status: DC | PRN
  Administered 2021-01-08 – 2021-01-10 (×6): 1 mg via INTRAVENOUS
  Filled 2021-01-08 (×6): qty 1

## 2021-01-08 MED ORDER — LORAZEPAM 2 MG/ML IJ SOLN: 1.0000 mg | INTRAMUSCULAR | Status: DC | PRN

## 2021-01-08 MED ORDER — LORAZEPAM 1 MG PO TABS: 1.0000 mg | ORAL_TABLET | ORAL | Status: DC | PRN

## 2021-01-08 MED ORDER — LORAZEPAM 2 MG/ML PO CONC: 1.0000 mg | ORAL | Status: DC | PRN

## 2021-01-08 NOTE — Plan of Care (Signed)
  Problem: Activity: Goal: Risk for activity intolerance will decrease Outcome: Progressing   Problem: Nutrition: Goal: Adequate nutrition will be maintained Outcome: Progressing   Problem: Coping: Goal: Level of anxiety will decrease Outcome: Progressing   Problem: Pain Managment: Goal: General experience of comfort will improve Outcome: Progressing   Problem: Skin Integrity: Goal: Risk for impaired skin integrity will decrease Outcome: Progressing   

## 2021-01-08 NOTE — Progress Notes (Signed)
[facility] 5N[facility]) Hospital Liaison Note  Received request from Transitions of Care Manager Cindee Salt, RN for family interest in [facility]. Spoke with niece Levan Hurst to confirm interest and explain services.  Approval for [facility] is determined by [facility] MD. Once [facility] MD has determined [facility] eligibility, [facility] will update hospital staff and family.  Please do not hesitate to call with any hospice related questions.    Thank you for the opportunity to participate in this patient's care.   Bobbie "Einar Gip, RN, BSN [facility] Liaison [phone]

## 2021-01-08 NOTE — Anesthesia Postprocedure Evaluation (Signed)
Anesthesia Post Note  Patient: [name]  Procedure(s) Performed: CANNULATED HIP PINNING (Left: Hip)     Patient location during evaluation: PACU Anesthesia Type: General Level of consciousness: awake and alert Pain management: pain level controlled Vital Signs Assessment: post-procedure vital signs reviewed and stable Respiratory status: spontaneous breathing, nonlabored ventilation, respiratory function stable and patient connected to nasal cannula oxygen Cardiovascular status: blood pressure returned to baseline and stable Postop Assessment: no apparent nausea or vomiting Anesthetic complications: no   No notable events documented.  Last Vitals:  Vitals:   01/07/21 2028 01/08/21 0423  BP: (!) 136/52 136/62  Pulse: 77 69  Resp: 19 16  Temp: 36.9 C 36.6 C  SpO2: 91% 91%    Last Pain:  Vitals:   01/07/21 2028  TempSrc:   PainSc: 2                  Kate Sweetman S

## 2021-01-08 NOTE — Progress Notes (Addendum)
PROGRESS NOTE  [name] RJJ:[id number] DOB: 03-21-1922 DOA: 01/03/2021 PCP: Almetta Lovely, [facility]  Brief History   The patient is a 87 yr old woman who presented after a witnessed fall. She is from [employer]. The fall was preceded by an episode of vomiting and incontinence of bladder.  There was no seiure like events. There is no recent change in medication. No previous nausea or vomiting. No frequent falls. There is no reported chest pain, fever, diarrhea, or strokelike symptoms prior to fall.  She carries a medical history if dementia, HTN, HLD, Hypothyroidism.   In the [facility] ED the patient was found to have suffered a fracture of the left femoral neck, intracranial contusion with petechial hemorrhage.  The patient has been transferred to [facility] for further evaluation and treatment.  Neurosurgery has evaluated the patient and recommends no surgical intervention. The patient has also been evaluated by orthopedic surgery. The plan is for intramedullary nail placement.  The patient underwent cannulated screw fixation of left hip femoral neck fracture on the morning of 01/05/2021. She has tolerated the procedure well. However, per nursing and report of the patient's daughter the patient has been refusing oral intake and medications. She also has demonstrated poor performance in working with PT/OT.   Palliative care has been consulted.  # Patient is now comfort care only.  Consultants  Orthopedic surgery Neurosurgery Palliative care  Procedures  Cannulated screw fixation of left hip femoral neck fracture  Antibiotics   Anti-infectives (From admission, onward)    Start     Dose/Rate Route Frequency Ordered Stop   01/04/21 2300  ceFAZolin (ANCEF) IVPB 2g/100 mL premix        2 g 200 mL/hr over 30 Minutes Intravenous Every 6 hours 01/04/21 1859 01/05/21 0435      Subjective  The patient is resting comfortably. No new complaints.   Objective   Vitals:  Vitals:   01/08/21  0804 01/08/21 1429  BP: 137/68 (!) 141/63  Pulse: 69 70  Resp: 18   Temp: 98.2 F (36.8 C) 98.1 F (36.7 C)  SpO2: 91% 96%    Exam:  Constitutional:  The patient is awake and alert. She is verbal, but confused. No acute distress. Respiratory:  CTA bilaterally, no w/r/r.  Respiratory effort normal. No retractions or accessory muscle use Cardiovascular:  RRR, no m/r/g No LE extremity edema   Normal pedal pulses Abdomen:  Abdomen appears normal; no tenderness or masses No hernias No HSM Musculoskeletal:  Digits/nails BUE: no clubbing, cyanosis, petechiae, infection Skin:  No rashes, lesions, ulcers palpation of skin: no induration or nodules Neurologic:  CN 2-12 intact Sensation all 4 extremities intact Psychiatric:  Patient is pleasantly confused. I have personally reviewed the following:   Today's Data  Vitals  Lab Data  CBC, BMP  Imaging  DG pelvis, abdomen CT hip CT head  Scheduled Meds:  traZODone  50 mg Oral QHS    Principal Problem:   Fracture of femoral neck, left, closed (HCC) Active Problems:   Essential hypertension   Dementia without behavioral disturbance (HCC)   Hyperlipidemia   Contusion with petechial hemorrhage   Scalp hematoma, initial encounter   Leucocytosis   Hypothyroidism   Vomiting   Unwitnessed fall   LOS: 5 days   A & P  Fracture of femoral neck, left, closed (HCC) Presents with unwitnessed fall.  Does not have any significant pain.  CT scan confirms presence of femoral neck fracture on the left. Orthopedic consulted.  Currently recommending intramedullary nail placement surgery. Patient will be transferred to [facility] to facilitate this. Discussed with daughter she currently consenting and would like to proceed with the surgery. Recommended the patient will remain at high risk for encephalopathy postoperatively and most likely with her dementia may not be able to work with physical therapy which is a key part of  recovery.  She understands it.  Patient actually had a surgery 2021 March and so far has done well per daughter.  S/P cannulated screw fixation of left hip femoral neck fracture on the morning of 01/05/2021. The patient has tolerated the procedure well.  # Patient is now comfort care only. Patient family is in agreement for [facility] if it is decided that she is appropriate to go there.   Unwitnessed fall Etiology of the fall is not clear.  For now we will monitor clinically. # Patient is now comfort care only.   Scalp hematoma, initial encounter Unwitnessed fall.  No bleeding so far.  Monitor. # Patient is now comfort care only.   Contusion with petechial hemorrhage Presents with an unwitnessed fall. CT scan shows evidence of contusion as well as petechial hemorrhage on the left. Neurosurgery was consulted based on the evaluation does not recommend any procedure or intervention. No further imaging recommendation as well. No focal deficit other than chronic dementia at the time of my evaluation. # Patient is now comfort care only.   Vomiting Per staff at [facility], patient had episode of vomiting as well as urination.  At the time of the fall although denies any seizure-like episode. For now we will monitor.  No focal deficit. # Patient is now comfort care only.   Leucocytosis Secondary to fall and fracture.  Monitor.  Currently no evidence of acute infection. # Patient is now comfort care only.   Essential hypertension Blood pressure currently normal.  We will hold antihypertensive medication and just provide IV fluids. # Patient is now comfort care only.   Dementia without behavioral disturbance (HCC) Placing the patient at significant risk for poor outcomes postoperatively given her dementia. After her recent surgery per daughter patient actually has progressive decline in her ability to ambulate although at her baseline she is able to feed herself, dress herself and  ambulate with assistance but mostly spends the day in bed. Patient may not be able to follow PT recommendation and nutrition guidance. Progress with PT/OT is slow. Daughter is aware.  Patient with poor PO intake, refusing meds and difficulty in participating with PT.  # Patient is now comfort care only.   Hyperlipidemia Continue statin. # Patient is now comfort care only.   Hypothyroidism Continue Synthroid      # Patient is now comfort care only.  I have seen and examined this patient myself. I have spent 34 minutes in her evaluation and care.     Code Status: DNR   DVT Prophylaxis:   SCDs Start: 01/03/21 1535 Place TED hose Start: 01/03/21 1535   Family Communication: None available Admission status: Inpatient Med-Surg   Sidhant Helderman, DO [facility] Direct contact: see [email or web address]  7PM-7AM contact night coverage as above 01/08/2021, 3:42 PM  LOS: 1 day

## 2021-01-08 NOTE — Consult Note (Signed)
Consultation Note Date: 01/08/2021   Patient Name: [name]  DOB: [birth year]  MRN: [id number]  Age / Sex: 87 y.o., female  PCP: [name], [name] Making Referring Physician: Karie Kirks, DO  Reason for Consultation: Establishing goals of care  HPI/Patient Profile: 87 y.o. female  with past medical history of dementia, HTN, HLD, Hypothyroidism, and prior hip fracture s/p repair admitted on 01/03/2021 with fall along with episode of vomiting and incontinence.  Found to have suffered a fracture of the left femoral neck, intracranial contusion, with petechial hemorrhage. The patient underwent cannulated screw fixation of left hip femoral neck fracture on the morning of 01/05/2021. She tolerated the procedure well however patient has been refusing food, drink, medications, and to work with therapy. PMT consulted to discuss [name].   Clinical Assessment and Goals of Care: I have reviewed medical records including EPIC notes, labs and imaging, received report from RN, assessed the patient and then met with patient's niece/HCPOA and niece's spouse  to discuss diagnosis prognosis, GOC, EOL wishes, disposition and options.  I introduced Palliative Medicine as specialized medical care for people living with serious illness. It focuses on providing relief from the symptoms and stress of a serious illness. The goal is to improve quality of life for both the patient and the family.  We discussed a brief life review of the patient. Patient's spouse is deceased and she does not have children. Her nieces are next of kin. She named her niece as HCPOA many years ago. 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 They tell me about the great quality of life and health that [name] had until about 4 years ago when her dementia prohibited her from continuing to live independently. They tell me about her closeness with her family and her 35, spirited  personality.   As far as functional and nutritional status, they tell me of a decline. Patient has recently been spending most of her time in bed - she prefers to be left alone and not interact with people at her facility. They tell me of patient's hip fracture about 2 years ago and at that time they did decide to pursue rehab; however, it was a horrible experience for patient as she was unable to follow commands and work with therapists and she could not remember she had broken her hip.    We discussed patient's current illness and what it means in the larger context of patient's on-going co-morbidities.  Natural disease trajectory and expectations at EOL were discussed. We discuss that [name] is now refusing all PO intake including water and medications. Family is not surprised by this. We discuss this is not uncommon for patient's with advanced dementia to decline rapidly following a significant event that requires hospitalization/surgery.   I attempted to elicit values and goals of care important to the patient.  Niece shares that patient has always been very clear about her wishes and would not want aggressive medical care to prolong her life.  Family also feels that patient is nearing end of life as she frequently refers to deceased loved ones and having conversations with them.   The difference between aggressive medical intervention and comfort care was considered in light of the patient's goals of care. Family is clear they would like to focus on patient's comfort only.   Hospice services outpatient were explained and offered. We discuss hospice care at patient's ALF vs hospice facility. Family feels that hospice facility is most appropriate  for patient. They are interested in [facility] as this is close to their home.   We discuss transition in the hospital to comfort focused care and family agrees.   Questions and concerns were addressed. The family was encouraged to call with  questions or concerns.   Primary Decision Maker HCPOA - niece [name]    SUMMARY OF RECOMMENDATIONS   - comfort measures only - dc orders and medications not needed to promote comfort - PRN medications added to promote comfort - [facility] referral  Code Status/Advance Care Planning: DNR  Prognosis:  < 2 weeks d/t NO PO intake  Discharge Planning: Hospice facility      Primary Diagnoses: Present on Admission:  Fracture of femoral neck, left, closed ([place])  Contusion with petechial hemorrhage  Scalp hematoma, initial encounter  Essential hypertension  Dementia without behavioral disturbance ([place])  Hyperlipidemia  Leucocytosis  Hypothyroidism  Vomiting  Unwitnessed fall   I have reviewed the medical record, interviewed the patient and family, and examined the patient. The following aspects are pertinent.  Past Medical History:  Diagnosis Date   Alzheimer disease ([place])    Dementia ([place])    Essential hypertension    Hyperlipidemia    Social History   Socioeconomic History   Marital status: Single    Spouse name: Not on file   Number of children: Not on file   Years of education: Not on file   Highest education level: Not on file  Occupational History   Not on file  Tobacco Use   Smoking status: Former    Packs/day: 0.25    Types: Cigarettes   Smokeless tobacco: Never   Tobacco comments:    smoked socially  Vaping Use   Vaping Use: Never used  Substance and Sexual Activity   Alcohol use: Never   Drug use: Never   Sexual activity: Not on file  Other Topics Concern   Not on file  Social History Narrative   Not on file   Social Determinants of Health   Financial Resource Strain: Not on file  Food Insecurity: Not on file  Transportation Needs: Not on file  Physical Activity: Not on file  Stress: Not on file  Social Connections: Not on file   History reviewed. No pertinent family history. Scheduled Meds:  traZODone  50 mg Oral QHS    Continuous Infusions: PRN Meds:.acetaminophen, antiseptic oral rinse, glycopyrrolate **OR** glycopyrrolate **OR** glycopyrrolate, haloperidol **OR** haloperidol **OR** haloperidol lactate, HYDROcodone-acetaminophen, LORazepam **OR** LORazepam **OR** LORazepam, metoCLOPramide **OR** metoCLOPramide (REGLAN) injection, morphine injection, ondansetron **OR** ondansetron (ZOFRAN) IV, polyvinyl alcohol, zolpidem Allergies  Allergen Reactions   Penicillins Other (See Comments)    Unknown Tolerates Keflex   Review of Systems  Unable to perform ROS: Dementia   Physical Exam Constitutional:      General: She is not in acute distress. Pulmonary:     Effort: Pulmonary effort is normal.  Skin:    General: Skin is warm and dry.  Neurological:     Mental Status: She is alert. She is disoriented.  Psychiatric:        Mood and Affect: Mood normal.        Cognition and Memory: Cognition is impaired.    Vital Signs: BP 137/68 (BP Location: Left Arm)   Pulse 69 Comment: manual pulse count  Temp 98.2 F (36.8 C) (Oral)   Resp 18   Ht 5' 2" (1.575 m)   Wt 54.4 kg   SpO2 91%   BMI  21.95 kg/m  Pain Scale: PAINAD POSS *See Group Information*: S-Acceptable,Sleep, easy to arouse Pain Score: 2    SpO2: SpO2: 91 % O2 Device:SpO2: 91 % O2 Flow Rate: .O2 Flow Rate (L/min): 2 L/min  IO: Intake/output summary:  Intake/Output Summary (Last 24 hours) at 01/08/2021 0943 Last data filed at 01/08/2021 9518 Gross per 24 hour  Intake 2436.57 ml  Output 950 ml  Net 1486.57 ml    LBM: Last BM Date: 01/03/21 Baseline Weight: Weight: 54.4 kg Most recent weight: Weight: 54.4 kg     Palliative Assessment/Data:   Flowsheet Rows    Flowsheet Row Most Recent Value  Intake Tab   Referral Department Hospitalist  Unit at Time of Referral Med/Surg Unit  Palliative Care Primary Diagnosis Trauma  Date Notified 01/07/21  Palliative Care Type New Palliative care  Reason for referral Clarify Goals of  Care  Date of Admission 01/03/21  Date first seen by Palliative Care 01/07/21  # of days Palliative referral response time 0 Day(s)  # of days IP prior to Palliative referral 4  Clinical Assessment   Psychosocial & Spiritual Assessment   Palliative Care Outcomes       Time Total: 85 minutes Greater than 50%  of this time was spent counseling and coordinating care related to the above assessment and plan.  Juel Burrow, DNP, AGNP-C Palliative Medicine Team [phone] Pager: [phone]

## 2021-01-08 NOTE — Plan of Care (Signed)
  Problem: Coping: Goal: Level of anxiety will decrease Outcome: Progressing   Problem: Elimination: Goal: Will not experience complications related to bowel motility Outcome: Progressing   Problem: Safety: Goal: Ability to remain free from injury will improve Outcome: Progressing   

## 2021-01-09 DIAGNOSIS — E782 Mixed hyperlipidemia: Secondary | ICD-10-CM | POA: Diagnosis not present

## 2021-01-09 DIAGNOSIS — S0689AA Other specified intracranial injury with loss of consciousness status unknown, initial encounter: Secondary | ICD-10-CM

## 2021-01-09 DIAGNOSIS — S06890A Other specified intracranial injury without loss of consciousness, initial encounter: Secondary | ICD-10-CM

## 2021-01-09 DIAGNOSIS — S72002A Fracture of unspecified part of neck of left femur, initial encounter for closed fracture: Secondary | ICD-10-CM | POA: Diagnosis not present

## 2021-01-09 DIAGNOSIS — F0390 Unspecified dementia without behavioral disturbance: Secondary | ICD-10-CM | POA: Diagnosis not present

## 2021-01-09 DIAGNOSIS — I10 Essential (primary) hypertension: Secondary | ICD-10-CM | POA: Diagnosis not present

## 2021-01-09 DIAGNOSIS — S72002D Fracture of unspecified part of neck of left femur, subsequent encounter for closed fracture with routine healing: Secondary | ICD-10-CM | POA: Diagnosis not present

## 2021-01-09 NOTE — Progress Notes (Addendum)
Daily Progress Note   Patient Name: [name]       Date: 01/09/2021 DOB: [birth year]  Age: 87 y.o. MRN#: [id number] Attending Physician: Fran Lowes, DO Primary Care Physician: Almetta Lovely, [facility] Admit Date: 01/03/2021  Reason for Consultation/Follow-up: Establishing goals of care and Terminal Care  Subjective: Medical records reviewed. Discussed with RN. Patient assessed at the bedside. Resting comfortably, no respiratory distress on room air. She does not awaken when I state her name. Allowed patient to continue sleeping. No family present during my visit.   Patient has been comfortable overnight per RN. Has had 2 PRN doses of IV morphine per MAR.   PMT will continue to support holistically.  Length of Stay: 6  Current Medications: Scheduled Meds:   traZODone  50 mg Oral QHS    PRN Meds: acetaminophen, antiseptic oral rinse, glycopyrrolate **OR** glycopyrrolate **OR** glycopyrrolate, haloperidol **OR** haloperidol **OR** haloperidol lactate, HYDROcodone-acetaminophen, LORazepam **OR** LORazepam **OR** LORazepam, metoCLOPramide **OR** metoCLOPramide (REGLAN) injection, morphine injection, ondansetron **OR** ondansetron (ZOFRAN) IV, polyvinyl alcohol, zolpidem  Physical Exam Vitals and nursing note reviewed.  Constitutional:      General: She is sleeping. She is not in acute distress. Cardiovascular:     Rate and Rhythm: Normal rate.  Pulmonary:     Effort: Pulmonary effort is normal. No accessory muscle usage or respiratory distress.  Skin:    General: Skin is warm and dry.            Vital Signs: BP 116/62 (BP Location: Left Arm)   Pulse 63   Temp 98.2 F (36.8 C)   Resp 16   Ht 5\' 2"  (1.575 m)   Wt 54.4 kg   SpO2 92%   BMI 21.95 kg/m  SpO2: SpO2: 92 % O2  Device: O2 Device: Room Air O2 Flow Rate: O2 Flow Rate (L/min): 2 L/min  Intake/output summary:  Intake/Output Summary (Last 24 hours) at 01/09/2021 0934 Last data filed at 01/09/2021 0641 Gross per 24 hour  Intake --  Output 1600 ml  Net -1600 ml   LBM: Last BM Date: 01/03/21 Baseline Weight: Weight: 54.4 kg Most recent weight: Weight: 54.4 kg       Palliative Assessment/Data: 10%    Flowsheet Rows    Flowsheet Row Most Recent Value  Intake Tab   Referral Department Hospitalist  Unit at Time of Referral Med/Surg Unit  Palliative Care Primary Diagnosis Trauma  Date Notified 01/07/21  Palliative Care Type New Palliative care  Reason for referral Clarify Goals of Care  Date of Admission 01/03/21  Date first seen by Palliative Care 01/07/21  # of days Palliative referral response time 0 Day(s)  # of days IP prior to Palliative referral 4  Clinical Assessment   Psychosocial & Spiritual Assessment   Palliative Care Outcomes        Patient Active Problem List   Diagnosis Date Noted   Fracture of femoral neck, left, closed (HCC) 01/03/2021   Contusion with petechial hemorrhage 01/03/2021   Scalp hematoma, initial encounter 01/03/2021   Leucocytosis 01/03/2021   Hypothyroidism 01/03/2021   Vomiting 01/03/2021   Unwitnessed fall 01/03/2021   Closed right hip fracture (HCC) 05/02/2019   Subdural hematoma 03/21/2019   Essential hypertension 03/21/2019   Dementia without behavioral disturbance (HCC) 03/21/2019   Hyperlipidemia 03/21/2019   Hypokalemia 03/21/2019    Palliative Care Assessment & Plan   Patient Profile: 87 y.o. female  with past medical history of dementia, HTN, HLD, Hypothyroidism, and prior hip fracture s/p repair admitted on 01/03/2021 with fall along with episode of vomiting and incontinence.  Found to have suffered a fracture of the left femoral neck, intracranial contusion, with petechial hemorrhage. The patient underwent cannulated screw fixation  of left hip femoral neck fracture on the morning of 01/05/2021. She tolerated the procedure well however patient has been refusing food, drink, medications, and to work with therapy. PMT consulted to discuss GOC.     Assessment: Left femoral neck fracture s/p unwitnessed fall Severe dementia End of life care  Recommendations/Plan: Continue comfort care, PRN medications per [facility] Patient is stable and awaits determination for [facility] eligibility PMT will continue to support  Goals of Care and Additional Recommendations: Limitations on Scope of Treatment: Full Comfort Care   Prognosis:  < 2 weeks  Discharge Planning: Hospice facility   Total time: 15 minutes Greater than 50% of this time was spent in counseling and coordinating care related to the above assessment and plan.  Richardson Dopp, PA-C Palliative Medicine Team Team phone # [phone]  Thank you for allowing the Palliative Medicine Team to assist in the care of this patient. Please utilize secure chat with additional questions, if there is no response within 30 minutes please call the above phone number.  Palliative Medicine Team providers are available by phone from 7am to 7pm daily and can be reached through the team cell phone.  Should this patient require assistance outside of these hours, please call the patient's attending physician.

## 2021-01-09 NOTE — Plan of Care (Signed)
  Problem: Pain Managment: Goal: General experience of comfort will improve Outcome: Progressing   Problem: Safety: Goal: Ability to remain free from injury will improve Outcome: Progressing   Problem: Skin Integrity: Goal: Risk for impaired skin integrity will decrease Outcome: Progressing   

## 2021-01-09 NOTE — Progress Notes (Signed)
PROGRESS NOTE  [name] QPR:[id number] DOB: 07-14-1922 DOA: 01/03/2021 PCP: Almetta Lovely, [facility]  Brief History   The patient is a 87 yr old woman who presented after a witnessed fall. She is from [employer]. The fall was preceded by an episode of vomiting and incontinence of bladder.  There was no seiure like events. There is no recent change in medication. No previous nausea or vomiting. No frequent falls. There is no reported chest pain, fever, diarrhea, or strokelike symptoms prior to fall.  She carries a medical history if dementia, HTN, HLD, Hypothyroidism.   In the [facility] ED the patient was found to have suffered a fracture of the left femoral neck, intracranial contusion with petechial hemorrhage.  The patient has been transferred to [facility] for further evaluation and treatment.  Neurosurgery has evaluated the patient and recommends no surgical intervention. The patient has also been evaluated by orthopedic surgery. The plan is for intramedullary nail placement.  The patient underwent cannulated screw fixation of left hip femoral neck fracture on the morning of 01/05/2021. She has tolerated the procedure well. However, per nursing and report of the patient's daughter the patient has been refusing oral intake and medications. She also has demonstrated poor performance in working with PT/OT.   Palliative care has been consulted.  # Patient is now comfort care only. She has been accepted at [facility], although there is no bed available today.  Consultants  Orthopedic surgery Neurosurgery Palliative care  Procedures  Cannulated screw fixation of left hip femoral neck fracture  Antibiotics   Anti-infectives (From admission, onward)    Start     Dose/Rate Route Frequency Ordered Stop   01/04/21 2300  ceFAZolin (ANCEF) IVPB 2g/100 mL premix        2 g 200 mL/hr over 30 Minutes Intravenous Every 6 hours 01/04/21 1859 01/05/21 0435      Subjective  The patient is  resting comfortably. No new complaints.   Objective   Vitals:  Vitals:   01/08/21 1949 01/09/21 0524  BP: 135/62 116/62  Pulse: 98 63  Resp: 18 16  Temp: 98 F (36.7 C) 98.2 F (36.8 C)  SpO2: 99% 92%    Exam:  Constitutional:  The patient is awake and alert. She is verbal, but confused. No acute distress. Respiratory:  CTA bilaterally, no w/r/r.  Respiratory effort normal. No retractions or accessory muscle use Cardiovascular:  RRR, no m/r/g No LE extremity edema   Normal pedal pulses Abdomen:  Abdomen appears normal; no tenderness or masses No hernias No HSM Musculoskeletal:  Digits/nails BUE: no clubbing, cyanosis, petechiae, infection Skin:  No rashes, lesions, ulcers palpation of skin: no induration or nodules Neurologic:  CN 2-12 intact Sensation all 4 extremities intact Psychiatric:  Patient is pleasantly confused. I have personally reviewed the following:   Today's Data  Vitals  Lab Data  CBC, BMP  Imaging  DG pelvis, abdomen CT hip CT head  Scheduled Meds:  traZODone  50 mg Oral QHS    Principal Problem:   Left displaced femoral neck fracture (HCC) Active Problems:   Essential hypertension   Dementia without behavioral disturbance (HCC)   Hyperlipidemia   Contusion with petechial hemorrhage   Scalp hematoma, initial encounter   Leucocytosis   Hypothyroidism   Vomiting   Unwitnessed fall   Left-sided intracranial contusion   LOS: 6 days   A & P  Fracture of femoral neck, left, closed (HCC) Presents with unwitnessed fall.  Does not have  any significant pain.  CT scan confirms presence of femoral neck fracture on the left. Orthopedic consulted.  Currently recommending intramedullary nail placement surgery. Patient will be transferred to [facility] to facilitate this. Discussed with daughter she currently consenting and would like to proceed with the surgery. Recommended the patient will remain at high risk for encephalopathy  postoperatively and most likely with her dementia may not be able to work with physical therapy which is a key part of recovery.  She understands it.  Patient actually had a surgery 2021 March and so far has done well per daughter.  S/P cannulated screw fixation of left hip femoral neck fracture on the morning of 01/05/2021. The patient has tolerated the procedure well.  # Patient is now comfort care only. Patient family is in agreement for [facility] if it is decided that she is appropriate to go there.   Unwitnessed fall Etiology of the fall is not clear.  For now we will monitor clinically. # Patient is now comfort care only.   Scalp hematoma, initial encounter Unwitnessed fall.  No bleeding so far.  Monitor. # Patient is now comfort care only.   Contusion with petechial hemorrhage Presents with an unwitnessed fall. CT scan shows evidence of contusion as well as petechial hemorrhage on the left. Neurosurgery was consulted based on the evaluation does not recommend any procedure or intervention. No further imaging recommendation as well. No focal deficit other than chronic dementia at the time of my evaluation. # Patient is now comfort care only.   Vomiting Per staff at [facility], patient had episode of vomiting as well as urination.  At the time of the fall although denies any seizure-like episode. For now we will monitor.  No focal deficit. # Patient is now comfort care only.   Leucocytosis Secondary to fall and fracture.  Monitor.  Currently no evidence of acute infection. # Patient is now comfort care only.   Essential hypertension Blood pressure currently normal.  We will hold antihypertensive medication and just provide IV fluids. # Patient is now comfort care only.   Dementia without behavioral disturbance (HCC) Placing the patient at significant risk for poor outcomes postoperatively given her dementia. After her recent surgery per daughter patient actually has  progressive decline in her ability to ambulate although at her baseline she is able to feed herself, dress herself and ambulate with assistance but mostly spends the day in bed. Patient may not be able to follow PT recommendation and nutrition guidance. Progress with PT/OT is slow. Daughter is aware.  Patient with poor PO intake, refusing meds and difficulty in participating with PT.  # Patient is now comfort care only.   Hyperlipidemia Continue statin. # Patient is now comfort care only.   Hypothyroidism Continue Synthroid      # Patient is now comfort care only.  I have seen and examined this patient myself. I have spent 34 minutes in her evaluation and care.     Code Status: DNR   DVT Prophylaxis:   SCDs Start: 01/03/21 1535 Place TED hose Start: 01/03/21 1535   Family Communication: None available Admission status: Inpatient Med-Surg Disposition: The patient has been accepted to [facility]. Awaiting bed availability.   Victoriah Wilds, DO [facility] Direct contact: see [email or web address]  7PM-7AM contact night coverage as above 01/09/2021, 1:50 PM  LOS: 1 day

## 2021-01-09 NOTE — Progress Notes (Signed)
[facility])  Ms. Katich is approved for residential hospice at [facility]. Unfortunately, we do not have a bed to offer today.  [facility] will update her niece and hospital staff once a bed is open.  Thank you, Wallis Bamberg BSN, RN [facility] Liaison

## 2021-01-10 DIAGNOSIS — S72002A Fracture of unspecified part of neck of left femur, initial encounter for closed fracture: Secondary | ICD-10-CM | POA: Diagnosis not present

## 2021-01-10 DIAGNOSIS — F0390 Unspecified dementia without behavioral disturbance: Secondary | ICD-10-CM | POA: Diagnosis not present

## 2021-01-10 DIAGNOSIS — R296 Repeated falls: Secondary | ICD-10-CM | POA: Diagnosis not present

## 2021-01-10 NOTE — Progress Notes (Signed)
Report called to Forde Radon at [facility]. Contact information for patient's niece provided.

## 2021-01-10 NOTE — Progress Notes (Addendum)
[employer] [facility]) Hospital Liaison Note  Spoke to patient family to offer a [facility] bed for transfer after 9pm tonight. Family (niece Galen Daft) agreeable to transfer patient this evening when bed available. TOC aware.    Will update this note once registration paperwork with [facility] SW complete.   Please fax discharge summary to [phone].   RN please call report to [phone].   TOC please arrange transport for patient to arrive after 9pm this evening.  Thank you.   Roda Shutters, RN [facility] Liaison  [phone] [facility] are on AMION   UPDATE: Patient [facility] Registration paperwork has been completed by the family [facility] will be ready to receive this patient after 9pm tonight.

## 2021-01-10 NOTE — Discharge Summary (Signed)
Physician Discharge Summary  [name]:[id number] DOB: 06/30/1922 DOA: 01/03/2021  PCP: Almetta Lovely, [facility]  Admit date: 01/03/2021 Discharge date: 01/10/2021  Recommendations for Outpatient Follow-up:  Discharge to [facility] for end of life care.   Follow-up Information     Kathryne Hitch, MD. Schedule an appointment as soon as possible for a visit in 2 week(s).   Specialty: Orthopedic Surgery Contact information: [address] [place] [address] [phone]                  Discharge Diagnoses: Principal diagnosis is #1 Alzheimer's Dementia - Advanced Anorexia Advanced age S/P Nailing of her left hip due to femoral neck fracture  Scalp hematoma  Cerebral contusion with petechial hemorrhage Leukocytosis Hypertension Hyperlipidemia Hypothyroidism  Discharge Condition: Serious  Disposition: [facility]  Diet recommendation: As tolerated.  Filed Weights   01/04/21 1522  Weight: 54.4 kg    History of present illness:   [name] is a 87 y.o. female with medical history significant of dementia, HTN, HLD, hypothyroidism.  Presents with complaints of an unwitnessed fall. Patient is from [employer].  Found down in the bathroom.  She had an episode of vomiting when the RN was evaluating the patient.  Also had an episode of loss of bladder at the same time. No seizure-like events. No recent change in medication.  No nausea or vomiting. No frequent falls.  No reported chest pain, fever, diarrhea, strokelike symptoms prior to this as well.    Hospital Course:  The patient is a 87 yr old woman who presented after a witnessed fall. She is from [employer]. The fall was preceded by an episode of vomiting and incontinence of bladder.   There was no seiure like events. There is no recent change in medication. No previous nausea or vomiting. No frequent falls. There is no reported chest pain, fever, diarrhea, or strokelike symptoms  prior to fall.   She carries a medical history if dementia, HTN, HLD, Hypothyroidism.    In the [facility] ED the patient was found to have suffered a fracture of the left femoral neck, intracranial contusion with petechial hemorrhage.   The patient has been transferred to [facility] for further evaluation and treatment.   Neurosurgery has evaluated the patient and recommends no surgical intervention. The patient has also been evaluated by orthopedic surgery. The plan is for intramedullary nail placement.   The patient underwent cannulated screw fixation of left hip femoral neck fracture on the morning of 01/05/2021. She has tolerated the procedure well. However, per nursing and report of the patient's daughter the patient has been refusing oral intake and medications. She also has demonstrated poor performance in working with PT/OT.    Palliative care has been consulted.  # Patient is now comfort care only. [facility]. It seems that there will be a bed available this evening.  Today's assessment: S: The patient is confused and is resting comfortably.  O: Vitals:  Vitals:   01/09/21 0524 01/10/21 0724  BP: 116/62 (!) 138/125  Pulse: 63 74  Resp: 16 17  Temp: 98.2 F (36.8 C) (!) 97.5 F (36.4 C)  SpO2: 92% 97%   Exam: Constitutional:  The patient is awake and alert. She is verbal, but confused. No acute distress. Respiratory:  CTA bilaterally, no w/r/r.  Respiratory effort normal. No retractions or accessory muscle use Cardiovascular:  RRR, no m/r/g No LE extremity edema   Normal pedal  pulses Abdomen:  Abdomen appears normal; no tenderness or masses No hernias No HSM Musculoskeletal:  Digits/nails BUE: no clubbing, cyanosis, petechiae, infection Skin:  No rashes, lesions, ulcers palpation of skin: no induration or nodules Neurologic:  CN 2-12 intact Sensation all 4 extremities intact Psychiatric:  Patient is pleasantly confused.  Discharge  Instructions  Discharge Instructions     Activity as tolerated - No restrictions   Complete by: As directed    Call MD for:  persistant nausea and vomiting   Complete by: As directed    Call MD for:  severe uncontrolled pain   Complete by: As directed    Diet - low sodium heart healthy   Complete by: As directed    Discharge instructions   Complete by: As directed    Discharge to hospice for end of life care.   Increase activity slowly   Complete by: As directed    No wound care   Complete by: As directed       Allergies as of 01/10/2021       Reactions   Penicillins Other (See Comments)   Unknown Tolerates Keflex        Medication List     STOP taking these medications    acetaminophen 325 MG tablet Commonly known as: TYLENOL   aspirin 325 MG EC tablet   calcium carbonate 500 MG chewable tablet Commonly known as: TUMS - dosed in mg elemental calcium   docusate sodium 100 MG capsule Commonly known as: COLACE   hydrOXYzine 25 MG tablet Commonly known as: ATARAX/VISTARIL   levothyroxine 25 MCG tablet Commonly known as: SYNTHROID   losartan 50 MG tablet Commonly known as: Cozaar   melatonin 5 MG Tabs   nystatin powder Commonly known as: MYCOSTATIN/NYSTOP   omeprazole 20 MG capsule Commonly known as: PRILOSEC   polyethylene glycol 17 g packet Commonly known as: MIRALAX / GLYCOLAX   pravastatin 10 MG tablet Commonly known as: PRAVACHOL   traZODone 50 MG tablet Commonly known as: DESYREL   Vitamin D3 25 MCG tablet Commonly known as: Vitamin D       Allergies  Allergen Reactions   Penicillins Other (See Comments)    Unknown Tolerates Keflex    The results of significant diagnostics from this hospitalization (including imaging, microbiology, ancillary and laboratory) are listed below for reference.    Significant Diagnostic Studies: DG Chest 2 View  Result Date: 01/03/2021 CLINICAL DATA:  Larey Seat with shortness of breath. EXAM: CHEST -  2 VIEW COMPARISON:  05/08/2019 FINDINGS: Patient has taken a poor inspiration. Heart size is normal. There is chronic aortic atherosclerotic calcification. Lung markings appear prominent, felt most likely due to the poor inspiration. I cannot rule out a degree fluid overload or early edema. No focal infiltrate, collapse or effusion. IMPRESSION: Poor inspiration. Prominent lung markings felt most likely secondary to that. Cannot rule out a degree of fluid overload/early edema. Electronically Signed   By: Paulina Fusi M.D.   On: 01/03/2021 12:12   DG Abd 1 View  Result Date: 01/04/2021 CLINICAL DATA:  Constipation EXAM: ABDOMEN - 1 VIEW COMPARISON:  None. FINDINGS: Bowel gas pattern is nonspecific. Small to moderate amount of stool is seen in ascending and transverse colon. Small amount of stool is seen in the descending colon. There is no demonstrable fecal impaction in the rectosigmoid. Surgical clips are seen in right upper abdomen. There is previous internal fixation in the right femur. Marked degenerative changes are noted in the lumbar  spine. IMPRESSION: Nonspecific bowel gas pattern. Small to moderate amount of stool is seen in colon. There are no signs of fecal impaction in the rectosigmoid. Electronically Signed   By: Ernie Avena M.D.   On: 01/04/2021 10:00   CT Head Wo Contrast  Result Date: 01/03/2021 CLINICAL DATA:  Trauma, fall EXAM: CT HEAD WITHOUT CONTRAST TECHNIQUE: Contiguous axial images were obtained from the base of the skull through the vertex without intravenous contrast. COMPARISON:  05/08/2019 FINDINGS: Brain: In the image twenty-five of series 2, there is 6 mm focus of subtle increased density in the anteromedial left frontal cortex. There is no demonstrable high density in the subarachnoid or extra-axial location in the same region. In the previous study, there was large area of parenchymal and subarachnoid hemorrhage in the anteromedial frontal lobes on both sides, more so on  the left side. In image 14, there is 6 mm ring-like calcification in the the floor of right anterior cranial fossa which appears stable. Ventricles are not dilated. There is no shift of midline structures. There is prominence of cortical sulci. There is decreased density in subcortical and periventricular white matter. Possible small old lacunar infarct is seen in the left basal ganglia. Vascular: There are scattered arterial calcifications. Skull: No fracture is seen in calvarium. There is subcutaneous contusion/hematoma in the left frontal scalp. Sinuses/Orbits: Unremarkable Other: None IMPRESSION: There is 6 mm focus of subtle increased density in the anteromedial left frontal cortex, possibly contusion with petechial hemorrhage. There is no demonstrable subarachnoid or extra-axial fluid collection. Short-term follow-up CT examination as clinically warranted should be considered. There is subcutaneous contusion/hematoma in the left frontal scalp. There is 6 mm ring-like calcific density along the floor of the right anterior cranial fossa, possibly dystrophic dural calcification or small meningioma. Atrophy.  Small-vessel disease. Electronically Signed   By: Ernie Avena M.D.   On: 01/03/2021 12:39   Pelvis Portable  Result Date: 01/04/2021 CLINICAL DATA:  Postop left hip EXAM: PORTABLE PELVIS 1-2 VIEWS COMPARISON:  CT left hip dated 01/03/2021 FINDINGS: Three cannulated cancellous screws transfix a subcapital left hip fracture, in near anatomic alignment and position. Prior ORIF of a right hip fracture. Bilateral hip joint spaces are preserved. Visualized bony pelvis is intact. Degenerative changes of the lower lumbar spine. IMPRESSION: Status post ORIF of the left hip. Prior ORIF of the right hip. Electronically Signed   By: Charline Bills M.D.   On: 01/04/2021 20:14   CT Hip Left Wo Contrast  Result Date: 01/03/2021 CLINICAL DATA:  Hip trauma, fracture suspected. Possible subcapital fracture  on radiographs. EXAM: CT OF THE LEFT HIP WITHOUT CONTRAST TECHNIQUE: Multidetector CT imaging of the left hip was performed according to the standard protocol. Multiplanar CT image reconstructions were also generated. COMPARISON:  Radiographs 01/03/2021 and 05/19/2019 FINDINGS: Bones/Joint/Cartilage There is a mildly impacted subcapital fracture of the left femoral neck, best seen on the reformatted images. The femoral head is located. There is no significant hip arthropathy. A small left hip joint effusion is noted. The visualized inferior left hemipelvis appears unremarkable. Ligaments Suboptimally assessed by CT. Muscles and Tendons Unremarkable. Soft tissues No periarticular hematoma, soft tissue emphysema or unexpected foreign body. Moderate iliofemoral atherosclerosis noted. There are diverticular changes within the distal colon. IMPRESSION: 1. CT confirms the presence of a mildly impacted subcapital fracture of the left femoral neck. 2. No dislocation or significant underlying hip arthropathy. Electronically Signed   By: Carey Bullocks M.D.   On: 01/03/2021 13:24  DG Shoulder Left  Result Date: 01/03/2021 CLINICAL DATA:  Fall EXAM: LEFT SHOULDER - 2+ VIEW COMPARISON:  None. FINDINGS: There is no evidence of acute fracture or dislocation. There is mild glenohumeral and moderate AC joint degenerative change. IMPRESSION: No acute fracture or dislocation. Electronically Signed   By: Caprice Renshaw M.D.   On: 01/03/2021 12:16   DG C-Arm 1-60 Min-No Report  Result Date: 01/04/2021 Fluoroscopy was utilized by the requesting physician.  No radiographic interpretation.   DG HIP OPERATIVE UNILAT WITH PELVIS LEFT  Result Date: 01/04/2021 CLINICAL DATA:  Cannulated left hip pinning EXAM: OPERATIVE LEFT HIP (WITH PELVIS IF PERFORMED) 4 VIEWS TECHNIQUE: Fluoroscopic spot image(s) were submitted for interpretation post-operatively. COMPARISON:  CT left hip dated 01/03/2021 FLUOROSCOPY TIME:  1 minute 36 seconds  17.74 mGy FINDINGS: Intraoperative fluoroscopic images during pinning of a subcapital left hip fracture. IMPRESSION: Intraoperative fluoroscopic images, as above. Electronically Signed   By: Charline Bills M.D.   On: 01/04/2021 20:14   DG Hip Unilat W or Wo Pelvis 2-3 Views Left  Result Date: 01/03/2021 CLINICAL DATA:  fall, some sob EXAM: DG HIP (WITH OR WITHOUT PELVIS) 2-3V LEFT COMPARISON:  Radiograph 05/19/2019 FINDINGS: Prior right femoral neck fixation. Intact hardware without evidence of loosening. There is possible femoral head neck offset on the lateral view versus is prominent osteophyte formation. Minimal bilateral hip degenerative changes. Vascular calcifications. IMPRESSION: Possible left-sided subcapital femoral neck fracture.  Recommend CT. Electronically Signed   By: Caprice Renshaw M.D.   On: 01/03/2021 12:15    Microbiology: Recent Results (from the past 240 hour(s))  Resp Panel by RT-PCR (Flu A&B, Covid) Nasopharyngeal Swab     Status: None   Collection Time: 01/03/21  1:50 PM   Specimen: Nasopharyngeal Swab; Nasopharyngeal(NP) swabs in vial transport medium  Result Value Ref Range Status   SARS Coronavirus 2 by RT PCR NEGATIVE NEGATIVE Final    Comment: (NOTE) SARS-CoV-2 target nucleic acids are NOT DETECTED.  The SARS-CoV-2 RNA is generally detectable in upper respiratory specimens during the acute phase of infection. The lowest concentration of SARS-CoV-2 viral copies this assay can detect is 138 copies/mL. A negative result does not preclude SARS-Cov-2 infection and should not be used as the sole basis for treatment or other patient management decisions. A negative result may occur with  improper specimen collection/handling, submission of specimen other than nasopharyngeal swab, presence of viral mutation(s) within the areas targeted by this assay, and inadequate number of viral copies(<138 copies/mL). A negative result must be combined with clinical observations,  patient history, and epidemiological information. The expected result is Negative.  Fact Sheet for Patients:  BloggerCourse.com  Fact Sheet for Healthcare Providers:  SeriousBroker.it  This test is no t yet approved or cleared by the [place] FDA and  has been authorized for detection and/or diagnosis of SARS-CoV-2 by FDA under an Emergency Use Authorization (EUA). This EUA will remain  in effect (meaning this test can be used) for the duration of the COVID-19 declaration under Section 564(b)(1) of the Act, 21 U.S.C.section 360bbb-3(b)(1), unless the authorization is terminated  or revoked sooner.       Influenza A by PCR NEGATIVE NEGATIVE Final   Influenza B by PCR NEGATIVE NEGATIVE Final    Comment: (NOTE) The Xpert Xpress SARS-CoV-2/FLU/RSV plus assay is intended as an aid in the diagnosis of influenza from Nasopharyngeal swab specimens and should not be used as a sole basis for treatment. Nasal washings and aspirates are unacceptable  for Xpert Xpress SARS-CoV-2/FLU/RSV testing.  Fact Sheet for Patients: BloggerCourse.com  Fact Sheet for Healthcare Providers: SeriousBroker.it  This test is not yet approved or cleared by the [place] FDA and has been authorized for detection and/or diagnosis of SARS-CoV-2 by FDA under an Emergency Use Authorization (EUA). This EUA will remain in effect (meaning this test can be used) for the duration of the COVID-19 declaration under Section 564(b)(1) of the Act, 21 U.S.C. section 360bbb-3(b)(1), unless the authorization is terminated or revoked.  Performed at [facility], [address], [place], [place] [address]   Surgical pcr screen     Status: None   Collection Time: 01/04/21  3:41 PM   Specimen: Nasal Mucosa; Nasal Swab  Result Value Ref Range Status   MRSA, PCR NEGATIVE NEGATIVE Final    Staphylococcus aureus NEGATIVE NEGATIVE Final    Comment: (NOTE) The Xpert SA Assay (FDA approved for NASAL specimens in patients 82 years of age and older), is one component of a comprehensive surveillance program. It is not intended to diagnose infection nor to guide or monitor treatment. Performed at [facility], [address], [place], [place] [address]      Labs: Basic Metabolic Panel: Recent Labs  Lab 01/04/21 0505 01/05/21 0242 01/06/21 0309 01/07/21 0215  NA 137 134* 135 136  K 3.7 3.9 3.4* 3.4*  CL 103 100 101 100  CO2 24 23 26 27   GLUCOSE 96 125* 96 94  BUN 20 16 22 20   CREATININE 0.80 0.80 0.74 0.76  CALCIUM 8.7* 8.2* 8.2* 8.5*   Liver Function Tests: No results for input(s): AST, ALT, ALKPHOS, BILITOT, PROT, ALBUMIN in the last 168 hours. No results for input(s): LIPASE, AMYLASE in the last 168 hours. No results for input(s): AMMONIA in the last 168 hours. CBC: Recent Labs  Lab 01/04/21 0505 01/05/21 0242 01/06/21 0309 01/07/21 0215 01/08/21 0225  WBC 11.7* 12.3* 12.8* 10.4 11.0*  HGB 12.4 11.6* 11.2* 11.8* 12.8  HCT 38.4 36.0 34.4* 36.4 38.3  MCV 90.4 91.4 90.3 90.1 88.7  PLT 238 215 240 257 282   Cardiac Enzymes: No results for input(s): CKTOTAL, CKMB, CKMBINDEX, TROPONINI in the last 168 hours. BNP: BNP (last 3 results) No results for input(s): BNP in the last 8760 hours.  ProBNP (last 3 results) No results for input(s): PROBNP in the last 8760 hours.  CBG: No results for input(s): GLUCAP in the last 168 hours.  Principal Problem:   Left displaced femoral neck fracture (HCC) Active Problems:   Essential hypertension   Dementia without behavioral disturbance (HCC)   Hyperlipidemia   Contusion with petechial hemorrhage   Scalp hematoma, initial encounter   Leucocytosis   Hypothyroidism   Vomiting   Unwitnessed fall   Left-sided intracranial contusion   Time coordinating discharge: 38 minutes.  Signed:        Mandisa Persinger,  DO [facility]  01/10/2021, 1:42 PM d

## 2021-01-10 NOTE — Progress Notes (Signed)
Daily Progress Note   Patient Name: [name]       Date: 01/10/2021 DOB: February 02, 1923  Age: 87 y.o. MRN#: [id number] Attending Physician: Fran Lowes, DO Primary Care Physician: Almetta Lovely, [facility] Admit Date: 01/03/2021  Reason for Consultation/Follow-up: Establishing goals of care and Terminal Care  Subjective: Medical records reviewed. Patient assessed at the bedside. Resting in bed with no signs of distress or discomfort noted. No family present during my visit.  PMT will continue to support holistically.  Length of Stay: 7  Current Medications: Scheduled Meds:   traZODone  50 mg Oral QHS    PRN Meds: acetaminophen, antiseptic oral rinse, glycopyrrolate **OR** glycopyrrolate **OR** glycopyrrolate, haloperidol **OR** haloperidol **OR** haloperidol lactate, HYDROcodone-acetaminophen, LORazepam **OR** LORazepam **OR** LORazepam, metoCLOPramide **OR** metoCLOPramide (REGLAN) injection, morphine injection, ondansetron **OR** ondansetron (ZOFRAN) IV, polyvinyl alcohol, zolpidem  Physical Exam Vitals and nursing note reviewed.  Constitutional:      General: She is sleeping. She is not in acute distress. Cardiovascular:     Rate and Rhythm: Normal rate.  Pulmonary:     Effort: Pulmonary effort is normal. No accessory muscle usage or respiratory distress.  Skin:    General: Skin is warm and dry.            Vital Signs: BP (!) 138/125 (BP Location: Left Arm)   Pulse 74   Temp (!) 97.5 F (36.4 C) (Oral)   Resp 17   Ht 5\' 2"  (1.575 m)   Wt 54.4 kg   SpO2 97%   BMI 21.95 kg/m  SpO2: SpO2: 97 % O2 Device: O2 Device: Room Air O2 Flow Rate: O2 Flow Rate (L/min): 2 L/min  Intake/output summary:  Intake/Output Summary (Last 24 hours) at 01/10/2021 1148 Last data filed  at 01/10/2021 01/12/2021 Gross per 24 hour  Intake --  Output 500 ml  Net -500 ml    LBM: Last BM Date: 01/03/21 Baseline Weight: Weight: 54.4 kg Most recent weight: Weight: 54.4 kg       Palliative Assessment/Data: 10%    Flowsheet Rows    Flowsheet Row Most Recent Value  Intake Tab   Referral Department Hospitalist  Unit at Time of Referral Med/Surg Unit  Palliative Care Primary Diagnosis Trauma  Date Notified 01/07/21  Palliative Care Type New Palliative care  Reason for referral Clarify  Goals of Care  Date of Admission 01/03/21  Date first seen by Palliative Care 01/07/21  # of days Palliative referral response time 0 Day(s)  # of days IP prior to Palliative referral 4  Clinical Assessment   Psychosocial & Spiritual Assessment   Palliative Care Outcomes        Patient Active Problem List   Diagnosis Date Noted   Left-sided intracranial contusion    Left displaced femoral neck fracture (HCC) 01/03/2021   Contusion with petechial hemorrhage 01/03/2021   Scalp hematoma, initial encounter 01/03/2021   Leucocytosis 01/03/2021   Hypothyroidism 01/03/2021   Vomiting 01/03/2021   Unwitnessed fall 01/03/2021   Closed right hip fracture (HCC) 05/02/2019   Subdural hematoma 03/21/2019   Essential hypertension 03/21/2019   Dementia without behavioral disturbance (HCC) 03/21/2019   Hyperlipidemia 03/21/2019   Hypokalemia 03/21/2019    Palliative Care Assessment & Plan   Patient Profile: 87 y.o. female  with past medical history of dementia, HTN, HLD, Hypothyroidism, and prior hip fracture s/p repair admitted on 01/03/2021 with fall along with episode of vomiting and incontinence.  Found to have suffered a fracture of the left femoral neck, intracranial contusion, with petechial hemorrhage. The patient underwent cannulated screw fixation of left hip femoral neck fracture on the morning of 01/05/2021. She tolerated the procedure well however patient has been refusing food,  drink, medications, and to work with therapy. PMT consulted to discuss GOC.     Assessment: Left femoral neck fracture s/p unwitnessed fall Severe dementia End of life care  Recommendations/Plan: Continue comfort care, PRN medications per [facility] Patient is stable for discharge to [facility], noted eligibility has been confirmed and a bed is available this evening PMT will continue to support as needed  Goals of Care and Additional Recommendations: Limitations on Scope of Treatment: Full Comfort Care   Prognosis:  < 2 weeks  Discharge Planning: Hospice facility   Total time: 15 minutes Greater than 50% of this time was spent in counseling and coordinating care related to the above assessment and plan.  Richardson Dopp, PA-C Palliative Medicine Team Team phone # [phone]  Thank you for allowing the Palliative Medicine Team to assist in the care of this patient. Please utilize secure chat with additional questions, if there is no response within 30 minutes please call the above phone number.  Palliative Medicine Team providers are available by phone from 7am to 7pm daily and can be reached through the team cell phone.  Should this patient require assistance outside of these hours, please call the patient's attending physician.

## 2021-01-10 NOTE — TOC Transition Note (Signed)
Transition of Care [facility]) - CM/SW Discharge Note   Patient Details  Name: BRANDICE BUSSER MRN: [id number] Date of Birth: 01/28/1923  Transition of Care [facility]) CM/SW Contact:  Bess Kinds, RN Phone Number: [phone] 01/10/2021, 2:10 PM   Clinical Narrative:     Notified by hospice liaison, Cordelia Pen, that patient has a bed at [facility] and family has accepted. Patient can admit after 9pm tonight. PTAR arranged. Medical transport paperwork complete. Signed DNR on chart. Nursing to call report to [facility] - please see hospice note from today for number.   Final next level of care: Hospice Medical Facility Barriers to Discharge: No Barriers Identified   Patient Goals and CMS Choice Patient states their goals for this hospitalization and ongoing recovery are:: inpatient hospice for end of life care CMS Medicare.gov Compare Post Acute Care list provided to:: Patient Represenative (must comment) [facility]) Choice offered to / list presented to : NA  Discharge Placement                       Discharge Plan and Services                DME Arranged: N/A DME Agency: NA       HH Arranged: NA HH Agency: NA        Social Determinants of Health (SDOH) Interventions     Readmission Risk Interventions No flowsheet data found.

## 2021-01-27 DEATH — deceased

## 2023-08-08 IMAGING — CT CT HIP*L* W/O CM
2 of 3 series · 17 of 46 positions shown, 19 images · non-contrast
Comparison: Radiographs 01/03/2021 and 05/19/2019

CLINICAL DATA: Hip trauma, fracture suspected. Possible subcapital
fracture on radiographs.

EXAM:
CT OF THE LEFT HIP WITHOUT CONTRAST
TECHNIQUE: Multidetector CT imaging of the left hip was performed according to
the standard protocol. Multiplanar CT image reconstructions were
also generated.

[Series 3: axial st · axial · 0.40mm/px · z∈[-270,-90]mm · 14 of 104 slices shown, 16 images]
[im 7/104  soft-tissue]
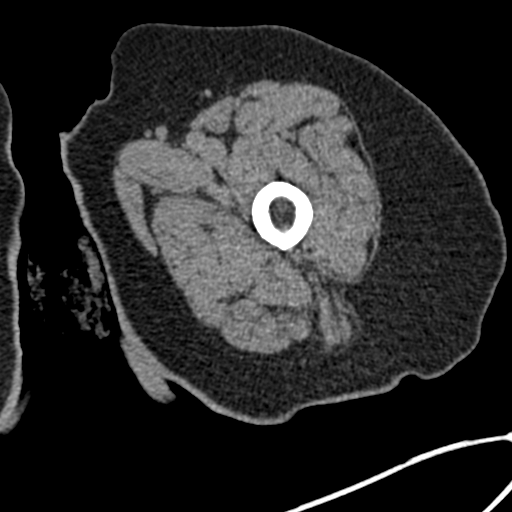
[im 7/104  bone]
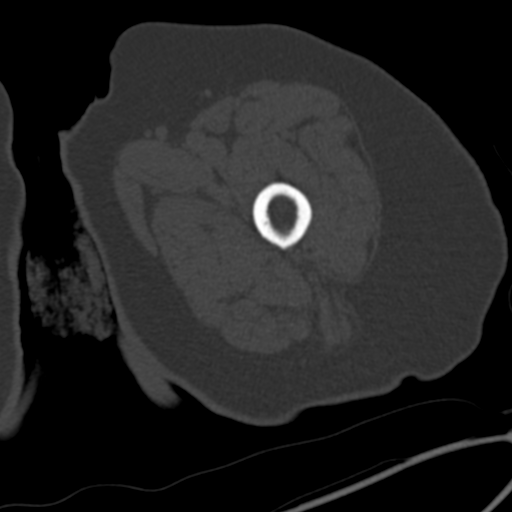
[im 14/104  soft-tissue]
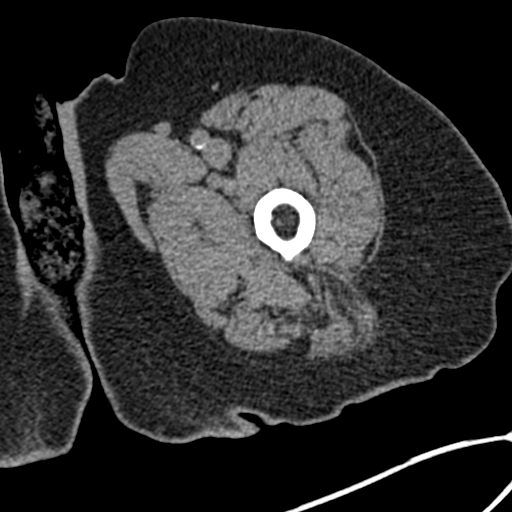
[im 20/104  soft-tissue]
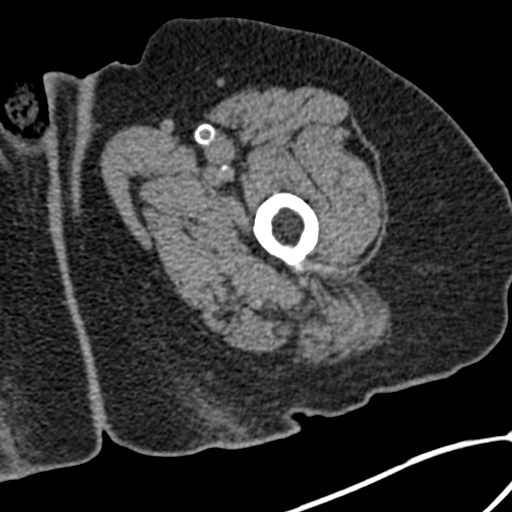
[im 27/104  soft-tissue]
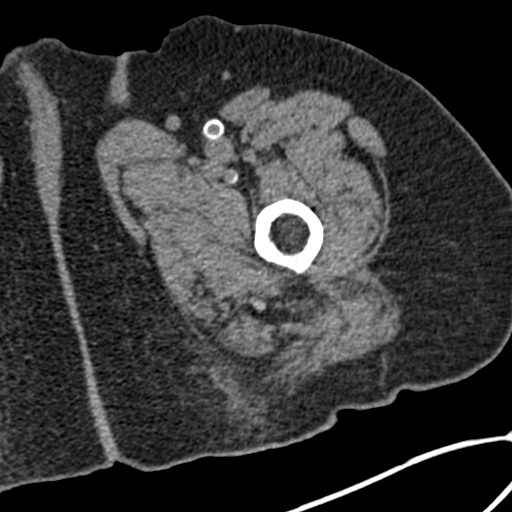
[im 34/104  soft-tissue]
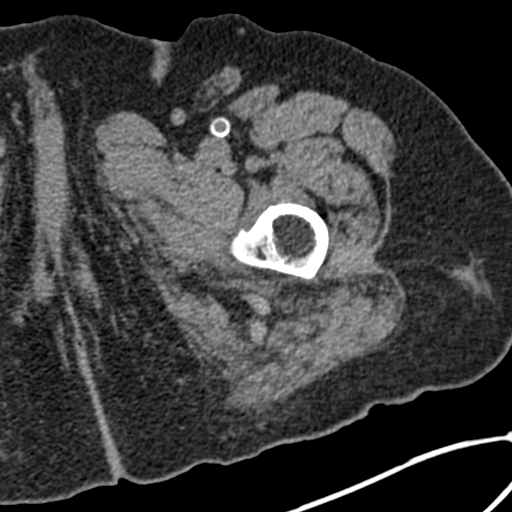
[im 40/104  soft-tissue]
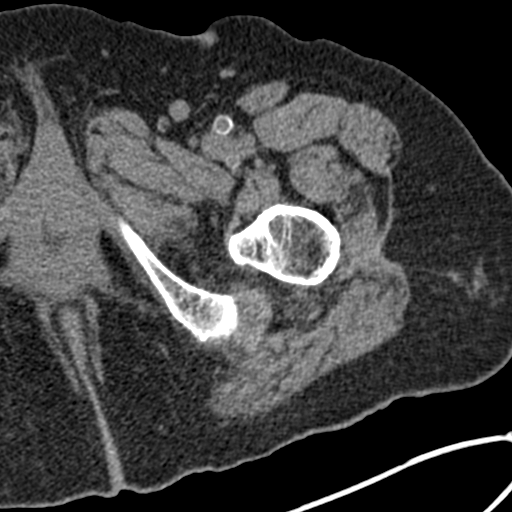
[im 47/104  soft-tissue]
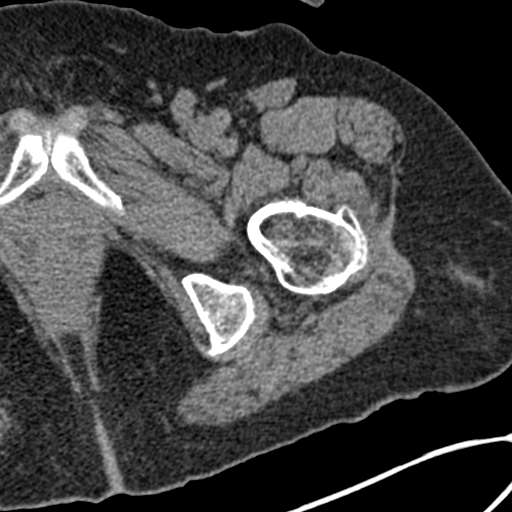
[im 57/104  soft-tissue]
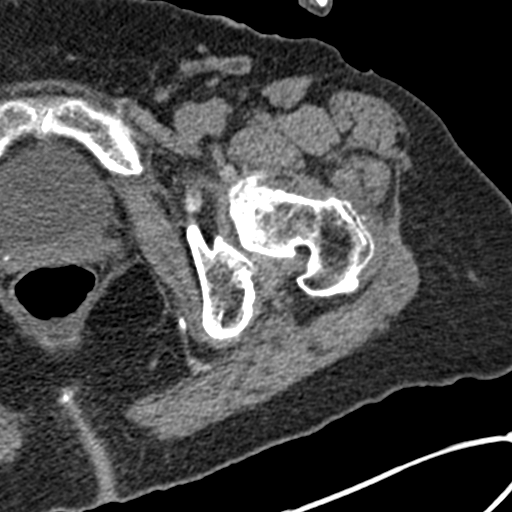
[im 64/104  soft-tissue]
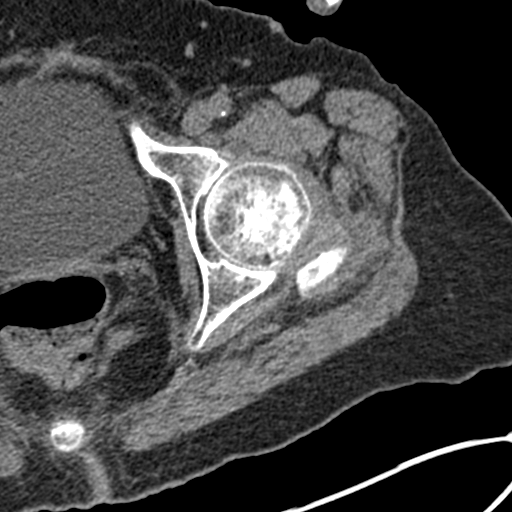
[im 64/104  bone]
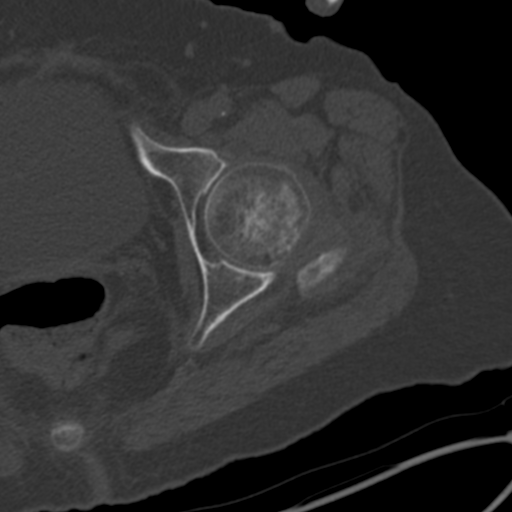
[im 70/104  soft-tissue]
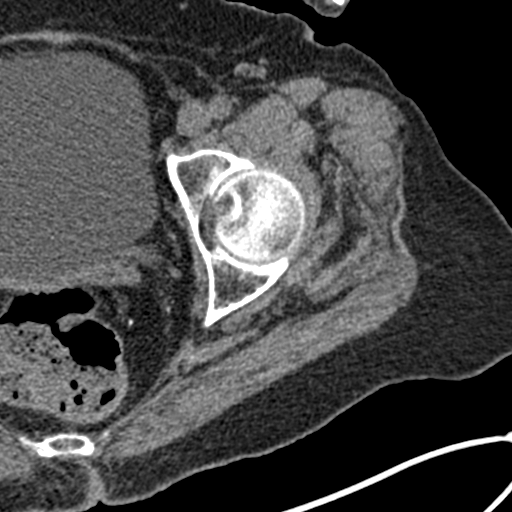
[im 77/104  soft-tissue]
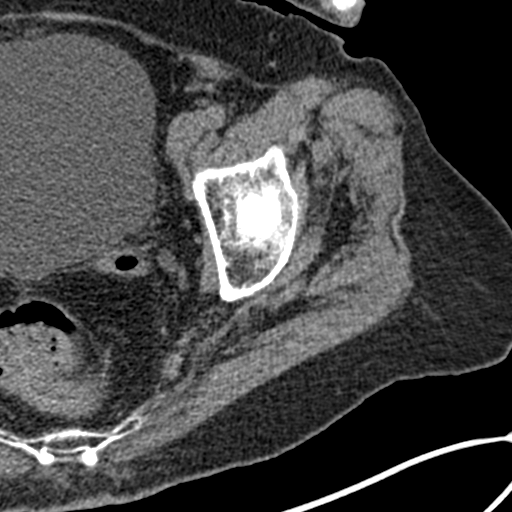
[im 84/104  soft-tissue]
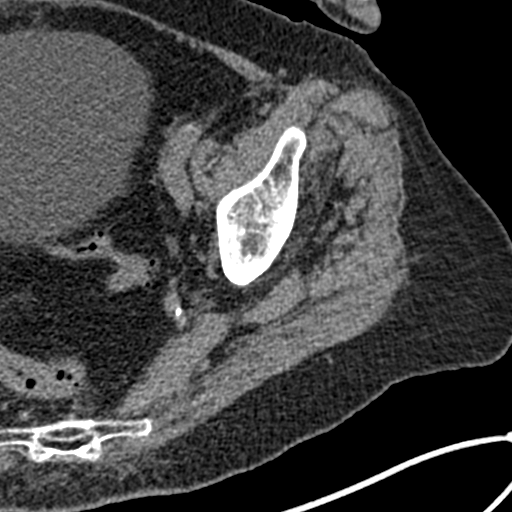
[im 90/104  soft-tissue]
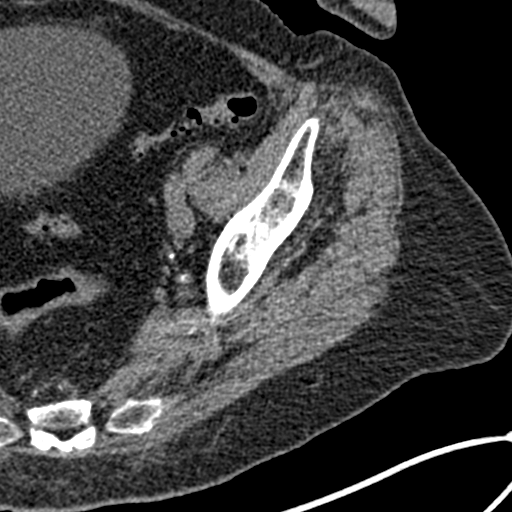
[im 97/104  soft-tissue]
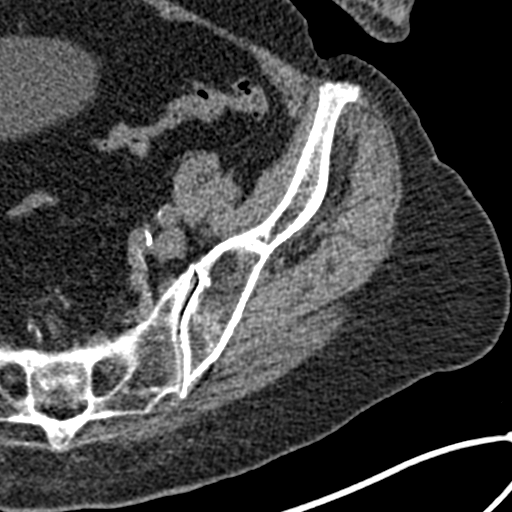

[Series 8: coronal st · coronal · 0.43mm/px · 3 of 78 slices shown]
[im 26/78  soft-tissue]
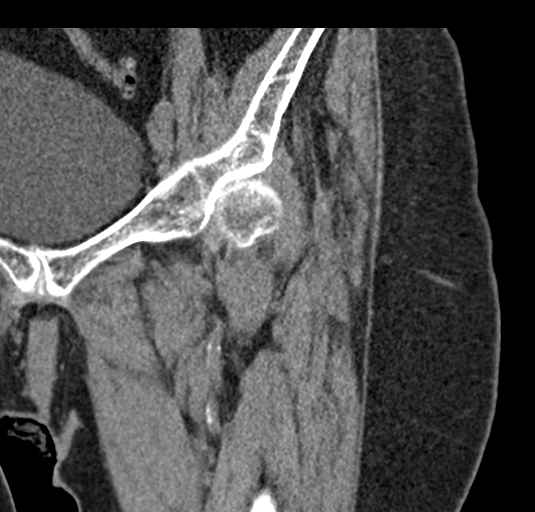
[im 35/78  soft-tissue]
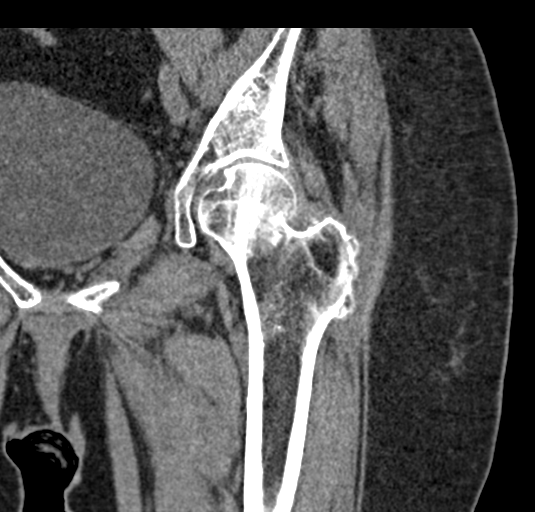
[im 43/78  soft-tissue]
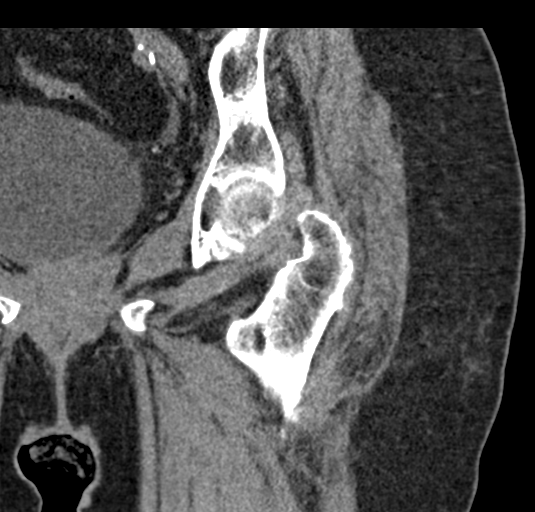

[17 of 46 positions shown; findings below may reference images not displayed]

FINDINGS: Bones/Joint/Cartilage

There is a mildly impacted subcapital fracture of the left femoral
neck, best seen on the reformatted images. The femoral head is
located. There is no significant hip arthropathy. A small left hip
joint effusion is noted. The visualized inferior left hemipelvis
appears unremarkable.

Ligaments

Suboptimally assessed by CT.

Muscles and Tendons

Unremarkable.

Soft tissues

No periarticular hematoma, soft tissue emphysema or unexpected
foreign body. Moderate iliofemoral atherosclerosis noted. There are
diverticular changes within the distal colon.
IMPRESSION: 1. CT confirms the presence of a mildly impacted subcapital fracture
of the left femoral neck.
2. No dislocation or significant underlying hip arthropathy.

## 2023-08-09 IMAGING — DX DG ABDOMEN 1V
1 series · 1 of 1 positions shown · non-contrast
Comparison: None.

CLINICAL DATA: Constipation

EXAM:
ABDOMEN - 1 VIEW

[t abdomen supine]
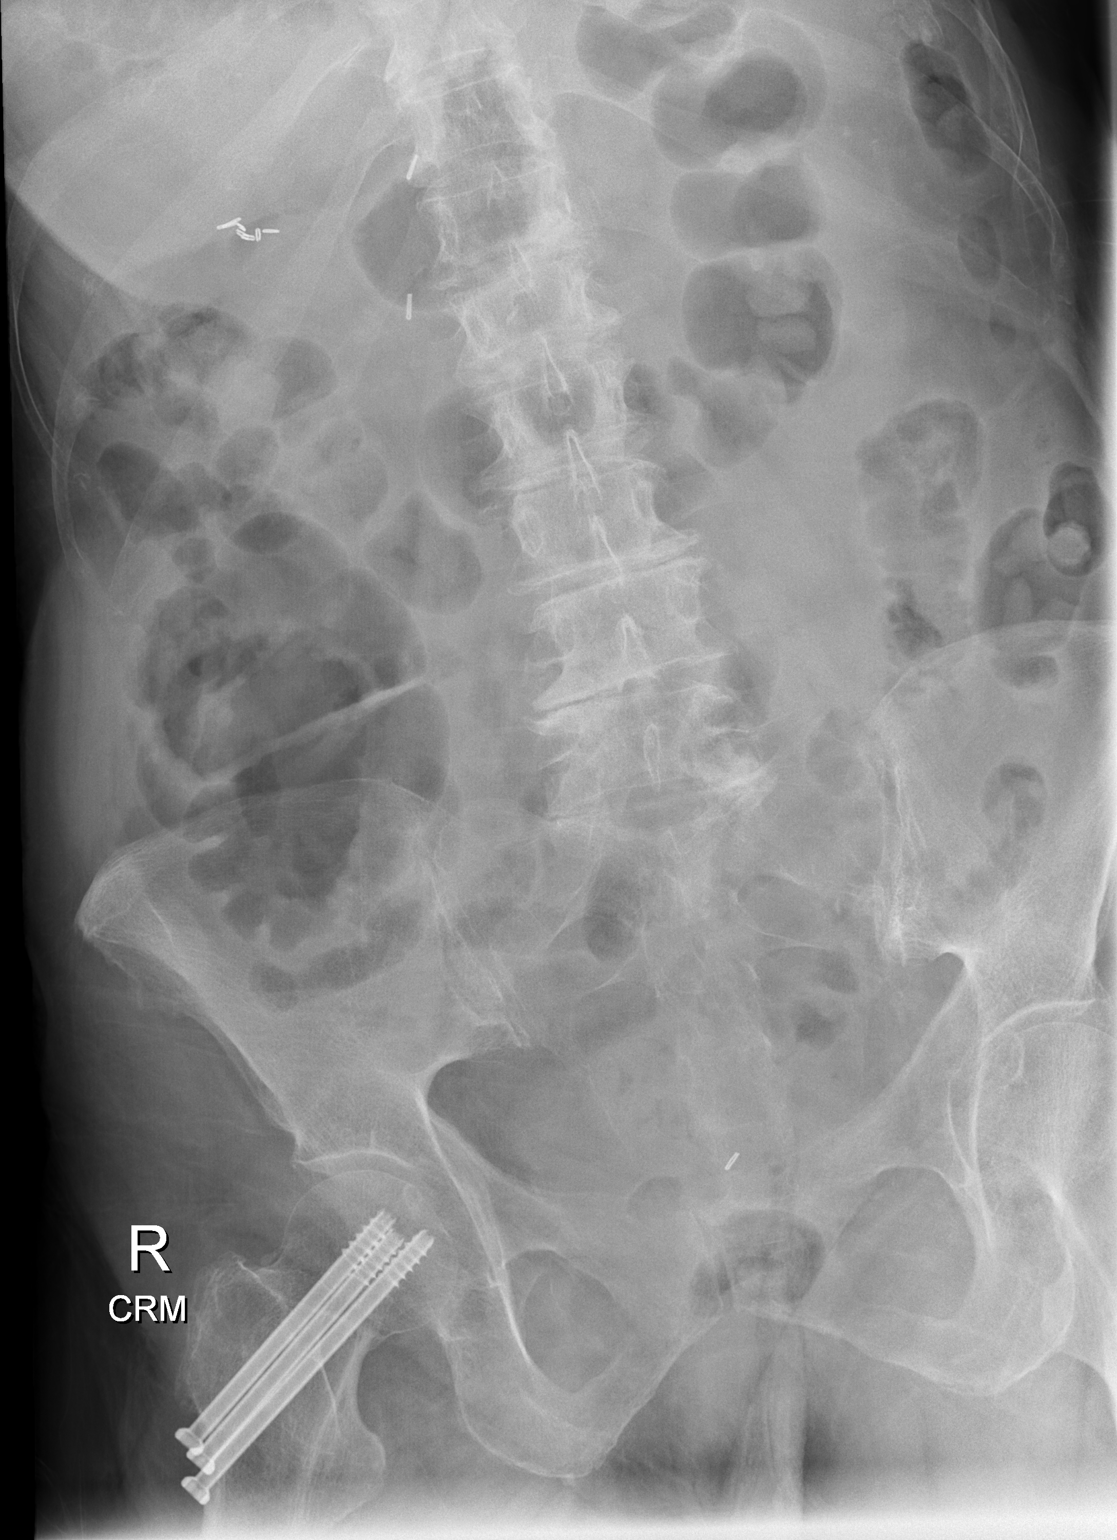

[1 of 1 positions shown; findings below may reference images not displayed]

FINDINGS: Bowel gas pattern is nonspecific. Small to moderate amount of stool
is seen in ascending and transverse colon. Small amount of stool is
seen in the descending colon. There is no demonstrable fecal
impaction in the rectosigmoid. Surgical clips are seen in right
upper abdomen. There is previous internal fixation in the right
femur. Marked degenerative changes are noted in the lumbar spine.
IMPRESSION: Nonspecific bowel gas pattern. Small to moderate amount of stool is
seen in colon. There are no signs of fecal impaction in the
rectosigmoid.
